# Patient Record
Sex: Male | Born: 1945 | Race: White | Hispanic: No | Marital: Married | Smoking: Never smoker
Health system: Southern US, Community
[De-identification: ages and names within clinical notes are randomized; demographics above are authoritative.]

## PROBLEM LIST (undated history)

## (undated) DIAGNOSIS — Z9109 Other allergy status, other than to drugs and biological substances: Secondary | ICD-10-CM

## (undated) DIAGNOSIS — H269 Unspecified cataract: Secondary | ICD-10-CM

## (undated) DIAGNOSIS — K219 Gastro-esophageal reflux disease without esophagitis: Secondary | ICD-10-CM

## (undated) DIAGNOSIS — M1990 Unspecified osteoarthritis, unspecified site: Secondary | ICD-10-CM

## (undated) DIAGNOSIS — J302 Other seasonal allergic rhinitis: Secondary | ICD-10-CM

## (undated) DIAGNOSIS — E785 Hyperlipidemia, unspecified: Secondary | ICD-10-CM

## (undated) DIAGNOSIS — T7840XA Allergy, unspecified, initial encounter: Secondary | ICD-10-CM

## (undated) DIAGNOSIS — N400 Enlarged prostate without lower urinary tract symptoms: Secondary | ICD-10-CM

## (undated) DIAGNOSIS — I2510 Atherosclerotic heart disease of native coronary artery without angina pectoris: Secondary | ICD-10-CM

## (undated) DIAGNOSIS — D369 Benign neoplasm, unspecified site: Secondary | ICD-10-CM

## (undated) DIAGNOSIS — R030 Elevated blood-pressure reading, without diagnosis of hypertension: Secondary | ICD-10-CM

## (undated) HISTORY — DX: Benign neoplasm, unspecified site: D36.9

## (undated) HISTORY — DX: Allergy, unspecified, initial encounter: T78.40XA

## (undated) HISTORY — PX: POLYPECTOMY: SHX149

## (undated) HISTORY — DX: Benign prostatic hyperplasia without lower urinary tract symptoms: N40.0

## (undated) HISTORY — DX: Gastro-esophageal reflux disease without esophagitis: K21.9

## (undated) HISTORY — DX: Elevated blood-pressure reading, without diagnosis of hypertension: R03.0

## (undated) HISTORY — DX: Unspecified cataract: H26.9

## (undated) HISTORY — DX: Other seasonal allergic rhinitis: J30.2

## (undated) HISTORY — DX: Other allergy status, other than to drugs and biological substances: Z91.09

## (undated) HISTORY — DX: Unspecified osteoarthritis, unspecified site: M19.90

## (undated) HISTORY — PX: OTHER SURGICAL HISTORY: SHX169

## (undated) HISTORY — DX: Atherosclerotic heart disease of native coronary artery without angina pectoris: I25.10

## (undated) HISTORY — DX: Hyperlipidemia, unspecified: E78.5

## (undated) HISTORY — PX: COLONOSCOPY: SHX174

---
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
## 2002-06-21 HISTORY — PX: CORONARY ANGIOPLASTY WITH STENT PLACEMENT: SHX49

## 2002-06-21 HISTORY — PX: ANGIOPLASTY: SHX39

## 2002-11-19 ENCOUNTER — Inpatient Hospital Stay (HOSPITAL_COMMUNITY): Admission: EM | Admit: 2002-11-19 | Discharge: 2002-11-21 | Payer: Self-pay | Admitting: Emergency Medicine

## 2002-11-19 ENCOUNTER — Encounter: Payer: Self-pay | Admitting: Cardiology

## 2004-04-23 ENCOUNTER — Ambulatory Visit: Payer: Self-pay | Admitting: Gastroenterology

## 2004-05-26 ENCOUNTER — Encounter: Admission: RE | Admit: 2004-05-26 | Discharge: 2004-05-26 | Payer: Self-pay | Admitting: *Deleted

## 2004-09-22 ENCOUNTER — Ambulatory Visit: Payer: Self-pay | Admitting: Critical Care Medicine

## 2004-09-28 ENCOUNTER — Ambulatory Visit: Payer: Self-pay | Admitting: Cardiology

## 2004-10-06 ENCOUNTER — Ambulatory Visit: Payer: Self-pay | Admitting: Cardiology

## 2004-10-08 ENCOUNTER — Ambulatory Visit: Payer: Self-pay | Admitting: Cardiology

## 2004-10-13 ENCOUNTER — Ambulatory Visit: Payer: Self-pay | Admitting: Critical Care Medicine

## 2005-03-19 ENCOUNTER — Ambulatory Visit: Payer: Self-pay | Admitting: Internal Medicine

## 2005-03-23 ENCOUNTER — Ambulatory Visit: Payer: Self-pay | Admitting: Internal Medicine

## 2005-03-31 ENCOUNTER — Ambulatory Visit: Payer: Self-pay

## 2005-04-06 ENCOUNTER — Ambulatory Visit: Payer: Self-pay | Admitting: Internal Medicine

## 2005-06-01 ENCOUNTER — Ambulatory Visit: Payer: Self-pay | Admitting: Internal Medicine

## 2005-06-17 ENCOUNTER — Ambulatory Visit: Payer: Self-pay | Admitting: Pulmonary Disease

## 2005-07-01 ENCOUNTER — Ambulatory Visit: Payer: Self-pay | Admitting: Critical Care Medicine

## 2005-07-14 ENCOUNTER — Ambulatory Visit: Payer: Self-pay | Admitting: Family Medicine

## 2005-08-04 ENCOUNTER — Ambulatory Visit: Payer: Self-pay | Admitting: Critical Care Medicine

## 2005-09-06 ENCOUNTER — Ambulatory Visit: Payer: Self-pay | Admitting: Critical Care Medicine

## 2005-10-19 ENCOUNTER — Ambulatory Visit: Payer: Self-pay | Admitting: Family Medicine

## 2005-12-07 ENCOUNTER — Ambulatory Visit: Payer: Self-pay | Admitting: Family Medicine

## 2006-04-28 ENCOUNTER — Ambulatory Visit: Payer: Self-pay | Admitting: Internal Medicine

## 2006-05-24 ENCOUNTER — Ambulatory Visit: Payer: Self-pay | Admitting: Internal Medicine

## 2006-05-31 ENCOUNTER — Ambulatory Visit: Payer: Self-pay | Admitting: Critical Care Medicine

## 2006-06-09 ENCOUNTER — Ambulatory Visit: Payer: Self-pay | Admitting: Internal Medicine

## 2006-06-20 ENCOUNTER — Ambulatory Visit: Payer: Self-pay | Admitting: Internal Medicine

## 2006-07-19 ENCOUNTER — Ambulatory Visit: Payer: Self-pay | Admitting: Internal Medicine

## 2007-02-06 LAB — CONVERTED CEMR LAB
Albumin: 3.9 g/dL (ref 3.5–5.2)
Alkaline Phosphatase: 59 units/L (ref 39–117)
BUN: 17 mg/dL (ref 6–23)
Basophils Absolute: 0 10*3/uL (ref 0.0–0.1)
Bilirubin Urine: NEGATIVE
Calcium: 9 mg/dL (ref 8.4–10.5)
GFR calc non Af Amer: 73 mL/min
HCT: 44.1 % (ref 39.0–52.0)
Hemoglobin: 14.9 g/dL (ref 13.0–17.0)
Hgb A1c MFr Bld: 5.4 % (ref 4.6–6.0)
Ketones, ur: NEGATIVE mg/dL
Leukocytes, UA: NEGATIVE
MCHC: 33.9 g/dL (ref 30.0–36.0)
MCV: 88 fL (ref 78.0–100.0)
Monocytes Relative: 8.4 % (ref 3.0–11.0)
Neutro Abs: 4.6 10*3/uL (ref 1.4–7.7)
PSA: 1.8 ng/mL (ref 0.10–4.00)
Platelets: 233 10*3/uL (ref 150–400)
TSH: 1.64 microintl units/mL (ref 0.35–5.50)
Total Protein, Urine: NEGATIVE mg/dL
Total Protein: 6.9 g/dL (ref 6.0–8.3)
Triglyceride fasting, serum: 92 mg/dL (ref 0–149)
pH: 6 (ref 5.0–8.0)

## 2007-03-08 ENCOUNTER — Ambulatory Visit: Payer: Self-pay | Admitting: Family Medicine

## 2007-04-25 ENCOUNTER — Ambulatory Visit: Payer: Self-pay | Admitting: Internal Medicine

## 2007-05-05 DIAGNOSIS — J309 Allergic rhinitis, unspecified: Secondary | ICD-10-CM | POA: Insufficient documentation

## 2007-05-15 ENCOUNTER — Telehealth (INDEPENDENT_AMBULATORY_CARE_PROVIDER_SITE_OTHER): Payer: Self-pay | Admitting: *Deleted

## 2007-06-13 ENCOUNTER — Telehealth (INDEPENDENT_AMBULATORY_CARE_PROVIDER_SITE_OTHER): Payer: Self-pay | Admitting: *Deleted

## 2007-06-19 ENCOUNTER — Ambulatory Visit: Payer: Self-pay | Admitting: Internal Medicine

## 2007-06-19 DIAGNOSIS — T7841XA Arthus phenomenon, initial encounter: Secondary | ICD-10-CM | POA: Insufficient documentation

## 2007-06-19 LAB — CONVERTED CEMR LAB
Alkaline Phosphatase: 50 units/L (ref 39–117)
Basophils Relative: 0.9 % (ref 0.0–1.0)
Bilirubin, Direct: 0.2 mg/dL (ref 0.0–0.3)
CO2: 28 meq/L (ref 19–32)
Calcium: 9.1 mg/dL (ref 8.4–10.5)
Chloride: 103 meq/L (ref 96–112)
Cholesterol: 114 mg/dL (ref 0–200)
Eosinophils Absolute: 0.1 10*3/uL (ref 0.0–0.6)
GFR calc non Af Amer: 73 mL/min
Glucose, Bld: 114 mg/dL — ABNORMAL HIGH (ref 70–99)
Hemoglobin: 14.6 g/dL (ref 13.0–17.0)
Lymphocytes Relative: 32.2 % (ref 12.0–46.0)
MCHC: 35.7 g/dL (ref 30.0–36.0)
MCV: 87.1 fL (ref 78.0–100.0)
Monocytes Absolute: 0.4 10*3/uL (ref 0.2–0.7)
Monocytes Relative: 8.3 % (ref 3.0–11.0)
Neutrophils Relative %: 56.4 % (ref 43.0–77.0)
Total Protein: 7.1 g/dL (ref 6.0–8.3)

## 2007-06-20 ENCOUNTER — Ambulatory Visit: Payer: Self-pay | Admitting: Internal Medicine

## 2007-06-20 DIAGNOSIS — I951 Orthostatic hypotension: Secondary | ICD-10-CM | POA: Insufficient documentation

## 2007-06-20 DIAGNOSIS — K209 Esophagitis, unspecified without bleeding: Secondary | ICD-10-CM | POA: Insufficient documentation

## 2007-06-20 DIAGNOSIS — E782 Mixed hyperlipidemia: Secondary | ICD-10-CM | POA: Insufficient documentation

## 2007-06-20 LAB — CONVERTED CEMR LAB: Cholesterol, target level: 200 mg/dL

## 2007-09-27 ENCOUNTER — Ambulatory Visit: Payer: Self-pay | Admitting: Internal Medicine

## 2007-09-27 DIAGNOSIS — M25519 Pain in unspecified shoulder: Secondary | ICD-10-CM | POA: Insufficient documentation

## 2007-09-27 DIAGNOSIS — E785 Hyperlipidemia, unspecified: Secondary | ICD-10-CM | POA: Insufficient documentation

## 2007-09-27 LAB — CONVERTED CEMR LAB: LDL Goal: 100 mg/dL

## 2007-09-28 ENCOUNTER — Encounter (INDEPENDENT_AMBULATORY_CARE_PROVIDER_SITE_OTHER): Payer: Self-pay | Admitting: *Deleted

## 2007-10-02 ENCOUNTER — Ambulatory Visit: Payer: Self-pay | Admitting: Internal Medicine

## 2007-10-08 LAB — CONVERTED CEMR LAB
AST: 22 units/L (ref 0–37)
Bilirubin, Direct: 0.2 mg/dL (ref 0.0–0.3)
Hgb A1c MFr Bld: 5.6 % (ref 4.6–6.0)
LDL Cholesterol: 63 mg/dL (ref 0–99)
PSA: 3.3 ng/mL (ref 0.10–4.00)
Total CHOL/HDL Ratio: 4.4
Triglycerides: 139 mg/dL (ref 0–149)

## 2007-10-09 ENCOUNTER — Encounter (INDEPENDENT_AMBULATORY_CARE_PROVIDER_SITE_OTHER): Payer: Self-pay | Admitting: *Deleted

## 2007-10-16 ENCOUNTER — Encounter: Payer: Self-pay | Admitting: Internal Medicine

## 2007-11-22 ENCOUNTER — Ambulatory Visit: Payer: Self-pay | Admitting: Cardiology

## 2007-12-21 ENCOUNTER — Telehealth (INDEPENDENT_AMBULATORY_CARE_PROVIDER_SITE_OTHER): Payer: Self-pay | Admitting: *Deleted

## 2008-01-10 ENCOUNTER — Telehealth (INDEPENDENT_AMBULATORY_CARE_PROVIDER_SITE_OTHER): Payer: Self-pay | Admitting: *Deleted

## 2008-06-04 ENCOUNTER — Telehealth (INDEPENDENT_AMBULATORY_CARE_PROVIDER_SITE_OTHER): Payer: Self-pay | Admitting: *Deleted

## 2008-06-05 ENCOUNTER — Ambulatory Visit: Payer: Self-pay | Admitting: Internal Medicine

## 2008-06-05 DIAGNOSIS — R030 Elevated blood-pressure reading, without diagnosis of hypertension: Secondary | ICD-10-CM | POA: Insufficient documentation

## 2008-06-05 DIAGNOSIS — R972 Elevated prostate specific antigen [PSA]: Secondary | ICD-10-CM | POA: Insufficient documentation

## 2008-06-10 ENCOUNTER — Telehealth (INDEPENDENT_AMBULATORY_CARE_PROVIDER_SITE_OTHER): Payer: Self-pay | Admitting: *Deleted

## 2008-07-17 ENCOUNTER — Telehealth (INDEPENDENT_AMBULATORY_CARE_PROVIDER_SITE_OTHER): Payer: Self-pay | Admitting: *Deleted

## 2008-08-30 ENCOUNTER — Telehealth (INDEPENDENT_AMBULATORY_CARE_PROVIDER_SITE_OTHER): Payer: Self-pay | Admitting: *Deleted

## 2008-09-06 ENCOUNTER — Ambulatory Visit: Payer: Self-pay | Admitting: Internal Medicine

## 2008-09-15 LAB — CONVERTED CEMR LAB
ALT: 23 units/L (ref 0–53)
AST: 21 units/L (ref 0–37)
Albumin: 4.1 g/dL (ref 3.5–5.2)
Bilirubin, Direct: 0.2 mg/dL (ref 0.0–0.3)
Calcium: 9 mg/dL (ref 8.4–10.5)
Chloride: 106 meq/L (ref 96–112)
Cholesterol: 124 mg/dL (ref 0–200)
GFR calc non Af Amer: 91.25 mL/min (ref >60)
Glucose, Bld: 111 mg/dL — ABNORMAL HIGH (ref 70–99)
Hemoglobin: 14.8 g/dL (ref 13.0–17.0)
LDL Cholesterol: 66 mg/dL (ref 0–99)
Lymphocytes Relative: 28 % (ref 12.0–46.0)
MCHC: 35.4 g/dL (ref 30.0–36.0)
Monocytes Relative: 8 % (ref 3.0–12.0)
Neutro Abs: 3 10*3/uL (ref 1.4–7.7)
Neutrophils Relative %: 61.2 % (ref 43.0–77.0)
Platelets: 198 10*3/uL (ref 150.0–400.0)
Potassium: 4.1 meq/L (ref 3.5–5.1)
RBC: 4.74 M/uL (ref 4.22–5.81)
RDW: 12.4 % (ref 11.5–14.6)
Sodium: 140 meq/L (ref 135–145)
Total Bilirubin: 1 mg/dL (ref 0.3–1.2)
Total Protein: 7 g/dL (ref 6.0–8.3)

## 2008-09-17 ENCOUNTER — Encounter (INDEPENDENT_AMBULATORY_CARE_PROVIDER_SITE_OTHER): Payer: Self-pay | Admitting: *Deleted

## 2008-10-16 ENCOUNTER — Ambulatory Visit: Payer: Self-pay | Admitting: Internal Medicine

## 2008-10-16 DIAGNOSIS — N400 Enlarged prostate without lower urinary tract symptoms: Secondary | ICD-10-CM | POA: Insufficient documentation

## 2008-10-16 DIAGNOSIS — I2510 Atherosclerotic heart disease of native coronary artery without angina pectoris: Secondary | ICD-10-CM | POA: Insufficient documentation

## 2008-10-16 DIAGNOSIS — Z860100 Personal history of colon polyps, unspecified: Secondary | ICD-10-CM | POA: Insufficient documentation

## 2008-10-16 DIAGNOSIS — Z85828 Personal history of other malignant neoplasm of skin: Secondary | ICD-10-CM | POA: Insufficient documentation

## 2008-12-10 ENCOUNTER — Telehealth (INDEPENDENT_AMBULATORY_CARE_PROVIDER_SITE_OTHER): Payer: Self-pay | Admitting: *Deleted

## 2009-01-02 ENCOUNTER — Emergency Department (HOSPITAL_COMMUNITY): Admission: EM | Admit: 2009-01-02 | Discharge: 2009-01-02 | Payer: Self-pay | Admitting: Emergency Medicine

## 2009-03-05 ENCOUNTER — Ambulatory Visit: Payer: Self-pay | Admitting: Family Medicine

## 2009-03-10 ENCOUNTER — Telehealth (INDEPENDENT_AMBULATORY_CARE_PROVIDER_SITE_OTHER): Payer: Self-pay | Admitting: *Deleted

## 2009-03-18 ENCOUNTER — Encounter (INDEPENDENT_AMBULATORY_CARE_PROVIDER_SITE_OTHER): Payer: Self-pay | Admitting: *Deleted

## 2009-03-18 LAB — CONVERTED CEMR LAB
HDL: 28.7 mg/dL — ABNORMAL LOW (ref >39.00)
Hgb A1c MFr Bld: 5.5 % (ref 4.6–6.5)
Total CHOL/HDL Ratio: 4
VLDL: 22.8 mg/dL (ref 0.0–40.0)

## 2009-03-19 ENCOUNTER — Telehealth (INDEPENDENT_AMBULATORY_CARE_PROVIDER_SITE_OTHER): Payer: Self-pay | Admitting: *Deleted

## 2009-04-03 ENCOUNTER — Ambulatory Visit: Payer: Self-pay | Admitting: Internal Medicine

## 2009-08-20 ENCOUNTER — Ambulatory Visit: Payer: Self-pay | Admitting: Internal Medicine

## 2009-08-20 DIAGNOSIS — R0609 Other forms of dyspnea: Secondary | ICD-10-CM

## 2009-08-20 DIAGNOSIS — R0989 Other specified symptoms and signs involving the circulatory and respiratory systems: Secondary | ICD-10-CM | POA: Insufficient documentation

## 2009-10-06 ENCOUNTER — Telehealth (INDEPENDENT_AMBULATORY_CARE_PROVIDER_SITE_OTHER): Payer: Self-pay | Admitting: *Deleted

## 2009-12-31 ENCOUNTER — Encounter: Payer: Self-pay | Admitting: Cardiology

## 2010-01-01 ENCOUNTER — Ambulatory Visit: Payer: Self-pay | Admitting: Cardiology

## 2010-01-27 ENCOUNTER — Ambulatory Visit: Payer: Self-pay

## 2010-01-27 ENCOUNTER — Ambulatory Visit (HOSPITAL_COMMUNITY): Admission: RE | Admit: 2010-01-27 | Discharge: 2010-01-27 | Payer: Self-pay | Admitting: Cardiology

## 2010-01-27 ENCOUNTER — Encounter: Payer: Self-pay | Admitting: Cardiology

## 2010-01-27 ENCOUNTER — Ambulatory Visit: Payer: Self-pay | Admitting: Cardiology

## 2010-03-11 ENCOUNTER — Ambulatory Visit: Payer: Self-pay | Admitting: Internal Medicine

## 2010-04-09 ENCOUNTER — Telehealth: Payer: Self-pay | Admitting: Internal Medicine

## 2010-04-10 ENCOUNTER — Telehealth: Payer: Self-pay | Admitting: Internal Medicine

## 2010-05-13 ENCOUNTER — Ambulatory Visit: Payer: Self-pay | Admitting: Family

## 2010-05-13 ENCOUNTER — Ambulatory Visit: Payer: Self-pay | Admitting: Interventional Radiology

## 2010-05-13 ENCOUNTER — Ambulatory Visit (HOSPITAL_BASED_OUTPATIENT_CLINIC_OR_DEPARTMENT_OTHER): Admission: RE | Admit: 2010-05-13 | Discharge: 2010-05-13 | Payer: Self-pay | Admitting: Internal Medicine

## 2010-05-13 ENCOUNTER — Telehealth: Payer: Self-pay | Admitting: Family

## 2010-06-05 ENCOUNTER — Ambulatory Visit: Payer: Self-pay | Admitting: Internal Medicine

## 2010-06-21 HISTORY — PX: CATARACT EXTRACTION, BILATERAL: SHX1313

## 2010-06-23 ENCOUNTER — Telehealth (INDEPENDENT_AMBULATORY_CARE_PROVIDER_SITE_OTHER): Payer: Self-pay | Admitting: *Deleted

## 2010-06-29 ENCOUNTER — Telehealth: Payer: Self-pay | Admitting: Internal Medicine

## 2010-07-01 ENCOUNTER — Other Ambulatory Visit: Payer: Self-pay | Admitting: Internal Medicine

## 2010-07-01 ENCOUNTER — Ambulatory Visit
Admission: RE | Admit: 2010-07-01 | Discharge: 2010-07-01 | Payer: Self-pay | Source: Home / Self Care | Attending: Internal Medicine | Admitting: Internal Medicine

## 2010-07-01 LAB — CBC WITH DIFFERENTIAL/PLATELET
Basophils Absolute: 0 10*3/uL (ref 0.0–0.1)
Basophils Relative: 0.6 % (ref 0.0–3.0)
Eosinophils Absolute: 0.1 10*3/uL (ref 0.0–0.7)
Eosinophils Relative: 2.7 % (ref 0.0–5.0)
HCT: 42.5 % (ref 39.0–52.0)
Hemoglobin: 14.8 g/dL (ref 13.0–17.0)
Lymphocytes Relative: 34 % (ref 12.0–46.0)
Lymphs Abs: 1.7 10*3/uL (ref 0.7–4.0)
MCHC: 34.8 g/dL (ref 30.0–36.0)
MCV: 88.8 fl (ref 78.0–100.0)
Monocytes Absolute: 0.3 10*3/uL (ref 0.1–1.0)
Monocytes Relative: 6.9 % (ref 3.0–12.0)
Neutro Abs: 2.8 10*3/uL (ref 1.4–7.7)
Neutrophils Relative %: 55.8 % (ref 43.0–77.0)
Platelets: 212 10*3/uL (ref 150.0–400.0)
RBC: 4.79 Mil/uL (ref 4.22–5.81)
RDW: 12.9 % (ref 11.5–14.6)
WBC: 5 10*3/uL (ref 4.5–10.5)

## 2010-07-01 LAB — BASIC METABOLIC PANEL
BUN: 21 mg/dL (ref 6–23)
CO2: 29 mEq/L (ref 19–32)
Calcium: 9.4 mg/dL (ref 8.4–10.5)
Chloride: 105 mEq/L (ref 96–112)
Creatinine, Ser: 0.9 mg/dL (ref 0.4–1.5)
GFR: 89.56 mL/min (ref >60.00)
Glucose, Bld: 109 mg/dL — ABNORMAL HIGH (ref 70–99)
Potassium: 4.8 mEq/L (ref 3.5–5.1)
Sodium: 141 mEq/L (ref 135–145)

## 2010-07-01 LAB — HEPATIC FUNCTION PANEL
ALT: 37 U/L (ref 0–53)
AST: 28 U/L (ref 0–37)
Albumin: 4.2 g/dL (ref 3.5–5.2)
Alkaline Phosphatase: 46 U/L (ref 39–117)
Bilirubin, Direct: 0.1 mg/dL (ref 0.0–0.3)
Total Bilirubin: 0.8 mg/dL (ref 0.3–1.2)
Total Protein: 7.2 g/dL (ref 6.0–8.3)

## 2010-07-01 LAB — PSA: PSA: 1.24 ng/mL (ref 0.10–4.00)

## 2010-07-01 LAB — TSH: TSH: 1.3 u[IU]/mL (ref 0.35–5.50)

## 2010-07-12 ENCOUNTER — Encounter: Payer: Self-pay | Admitting: Critical Care Medicine

## 2010-07-14 ENCOUNTER — Encounter: Payer: Self-pay | Admitting: Internal Medicine

## 2010-07-16 ENCOUNTER — Ambulatory Visit
Admission: RE | Admit: 2010-07-16 | Discharge: 2010-07-16 | Payer: Self-pay | Source: Home / Self Care | Attending: Family Medicine | Admitting: Family Medicine

## 2010-07-16 DIAGNOSIS — R7309 Other abnormal glucose: Secondary | ICD-10-CM | POA: Insufficient documentation

## 2010-07-19 LAB — CONVERTED CEMR LAB
Basophils Absolute: 0 10*3/uL (ref 0.0–0.1)
Eosinophils Relative: 2.2 % (ref 0.0–5.0)
Hemoglobin: 14.1 g/dL (ref 13.0–17.0)
Lymphocytes Relative: 24.7 % (ref 12.0–46.0)
Monocytes Relative: 7.5 % (ref 3.0–12.0)
Platelets: 194 10*3/uL (ref 150.0–400.0)
RDW: 12.8 % (ref 11.5–14.6)
WBC: 5 10*3/uL (ref 4.5–10.5)

## 2010-07-23 NOTE — Progress Notes (Signed)
Summary: Call request/cough  Phone Note Call from Patient Call back at Home Phone [phone]   Summary of Call: Patient returned call to MD stating that he has previously been dx with GERD and would like to discuss this cough with Dr. Alwyn Ren before the prescription for cough syrup is faxed to his pharmacy. Please call the patient at home. Initial call taken by: Lucious Groves CMA,  April 10, 2010 2:34 PM  Follow-up for Phone Call        if this is caused by ERD, Omeprazole 20 mg two times a day 30 min pre meals will resolve cough (#60), refill X1 Follow-up by: Marga Melnick MD,  April 10, 2010 3:02 PM  Additional Follow-up for Phone Call Additional follow up Details #1::        Patient notified of the above and Omeprazole prescription sent. Cough med was never sent so I removed from med list. Additional Follow-up by: Lucious Groves CMA,  April 10, 2010 3:14 PM    New/Updated Medications: OMEPRAZOLE 20 MG CPDR (OMEPRAZOLE) 1 by mouth two times a day Prescriptions: OMEPRAZOLE 20 MG CPDR (OMEPRAZOLE) 1 by mouth two times a day  #60 x 3   Entered by:   Lucious Groves CMA   Authorized by:   Marga Melnick MD   Signed by:   Lucious Groves CMA on 04/10/2010   Method used:   Faxed to ...       [facility]* (retail)       [address]       [place], [place]  [id number]       Ph: [phone]       Fax: [phone]   RxID:   [id number]

## 2010-07-23 NOTE — Assessment & Plan Note (Signed)
Summary: cpx//lch   Vital Signs:  Patient profile:   65 year old male Height:      74 inches (187.96 cm) Weight:      223.38 pounds (101.54 kg) BMI:     28.78 Temp:     98.0 degrees F (36.67 degrees C) oral Resp:     12 per minute BP sitting:   140 / 80  (left arm) Cuff size:   regular  Vitals Entered By: Lucious Groves CMA (July 16, 2010 1:03 PM) CC: CPX./kb Is Patient Diabetic? No Pain Assessment Patient in pain? no      Comments Patient notes that he is taking Nexium and would like it changed to Protonix.  Recheck on B/P 138/84 (Left Arm)    Primary Care Provider:  Marga Melnick MD  CC:  CPX./kb.  History of Present Illness:    [name] is here for a physical; he is asymptomatic.    Hyperlipidemia Follow-Up: The patient denies muscle aches, GI upset, abdominal pain, flushing, itching, constipation, diarrhea, and fatigue.  Other symptoms include dypsnea.  The patient denies the following symptoms: chest pain/pressure, exercise intolerance, palpitations, syncope, and pedal edema.  Compliance with medications (by patient report) has been near 100%.  Dietary compliance has been good.  The patient reports no exercise.  Adjunctive measures currently used by the patient include fiber, ASA, fish oil supplements, and Co-QA.   he is only 1 Lovasa two times a day . [facility] Heart Panel reviewed & risks / options discussed.  Current Medications (verified): 1)  Nasacort Aq 55 Mcg/act  Aers (Triamcinolone Acetonide(Nasal)) .... Use 2 Sprays Each Nostril Once A Day For Runny Nose and Drainage 2)  Adult Aspirin Low Strength 81 Mg  Tbdp (Aspirin) .Marland Kitchen.. 1 By Mouth Two Times A Day 3)  Uroxatral 10 Mg  Tb24 (Alfuzosin Hcl) .... 1/2 Tablet Daily As Needed. 4)  Crestor 20 Mg  Tabs (Rosuvastatin Calcium) .Marland Kitchen.. 1 By Mouth Once Daily **appointment Due** 5)  Avodart 0.5 Mg  Caps (Dutasteride) .Marland Kitchen.. 1 By Mouth 6)  Lovaza 1 Gm Caps (Omega-3-Acid Ethyl Esters) .... 2 Pills Two Times A Day **appointment  Due** 7)  Glucosamine Msm Complex  Tabs (Glucos-Msm-C-Mn-Ginger-Willow) .... 2 By Mouth Daily 8)  Multivitamins   Tabs (Multiple Vitamin) .... Take 1 Tablet By Mouth Two Times A Day. 9)  Xyzal 5 Mg Tabs (Levocetirizine Dihydrochloride) .Marland Kitchen.. 1 At Bedtime As Needed 10)  Nexium 40 Mg Cpdr (Esomeprazole Magnesium) .Marland Kitchen.. 1 By Mouth Once Daily  Allergies (verified): 1)  ! Sulfa  Past History:  Past Medical History: Allergic Rhinitis; ? asthma variant Hyperlipidemia Benign prostatic hypertrophy, Dr Londell Moh  [place] Colonic polyps, PMH  of Coronary artery disease, Dr Jerral Bonito.Marland KitchenMarland KitchenTaxus stent 2004 /  nuclear.. 2006.. no ischemia...EF 63% EF 63%.... nuclear..... 2006 Skin cancer, squamous Cell , PMH  of  DOE  climbing stairs  Past Surgical History: Cath/ stent 2003: 90% block of circumflex Colon polypectomy 2000, negative  2005 Tonsillectomy Cataract extraction OS 05/2010, OD scheduled 07/2010, Dr Burgess Estelle  Family History: Father: DM,MI in 41s Mother: Osteoporosis Siblings: bro: MI in 50s,lung  cancer, leukemia; P uncle: MI in 3s; MGM: CVA  Social History: Retired Married Never Smoked Alcohol use-yes: socially Regular exercise-no  Review of Systems  The patient denies anorexia, fever, vision loss, decreased hearing, hoarseness, prolonged cough, hemoptysis, melena, hematochezia, severe indigestion/heartburn, hematuria, suspicious skin lesions, depression, unusual weight change, abnormal bleeding, enlarged lymph nodes, and angioedema.  Weight creeping up  Physical Exam  General:  well-nourished;alert,appropriate and cooperative throughout examination Head:  Normocephalic and atraumatic without obvious abnormalities. No  alopecia Eyes:  No corneal or conjunctival inflammation noted. Cataract OD Ears:  External ear exam shows no significant lesions or deformities.  Otoscopic examination reveals clear canals, tympanic membranes are intact bilaterally without bulging,  retraction, inflammation or discharge. Hearing is grossly normal bilaterally. Nose:  External nasal examination shows no deformity or inflammation. Nasal mucosa are pink and moist without lesions or exudates. Mouth:  Oral mucosa and oropharynx without lesions or exudates.  Teeth in good repair. Neck:  No deformities, masses, or tenderness noted. Lungs:  Normal respiratory effort, chest expands symmetrically. Lungs are clear to auscultation, no crackles or wheezes. Heart:  normal rate, regular rhythm, no gallop, no rub, no JVD, no HJR, and grade 1 /6 systolic murmur.   Abdomen:  Bowel sounds positive,abdomen soft and non-tender without masses, organomegaly or hernias noted. Genitalia:  Dr Aldean Ast Msk:  No deformity or scoliosis noted of thoracic or lumbar spine.   Pulses:  R and L carotid,radial,dorsalis pedis and posterior tibial pulses are full and equal bilaterally Extremities:  No clubbing, cyanosis, edema, or deformity noted with normal full range of motion of all joints.   Neurologic:  alert & oriented X3 and DTRs symmetrical and normal.   Skin:  Intact without suspicious lesions or rashes Cervical Nodes:  No lymphadenopathy noted Axillary Nodes:  No palpable lymphadenopathy Psych:  memory intact for recent and remote, normally interactive, and good eye contact.     Impression & Recommendations:  Problem # 1:  ROUTINE GENERAL MEDICAL EXAM@HEALTH  CARE FACL (ICD-V70.0)  Problem # 2:  CORONARY ARTERY DISEASE (ICD-414.00)  His updated medication list for this problem includes:    Adult Aspirin Low Strength 81 Mg Tbdp (Aspirin) .Marland Kitchen... 1 by mouth two times a day    Ramipril 5 Mg Caps (Ramipril) .Marland Kitchen... 1 once daily  Problem # 3:  HYPERLIPIDEMIA (ICD-272.4)  His updated medication list for this problem includes:    Crestor 20 Mg Tabs (Rosuvastatin calcium) .Marland Kitchen... 1 by mouth once daily    Lovaza 1 Gm Caps (Omega-3-acid ethyl esters) .Marland Kitchen... 2 pills two times a day  Problem # 4:   HYPERGLYCEMIA, FASTING (ICD-790.29)  pre Diabetes is suggested by high insulin level & FBS > 100  His updated medication list for this problem includes:    Metformin Hcl 500 Mg Xr24h-tab (Metformin hcl) .Marland Kitchen... 1 once daily with largest meal  Problem # 5:  DYSPNEA/SHORTNESS OF BREATH (ICD-786.09)  His updated medication list for this problem includes:    Ramipril 5 Mg Caps (Ramipril) .Marland Kitchen... 1 once daily  Problem # 6:  ELEVATED BLOOD PRESSURE WITHOUT DIAGNOSIS OF HYPERTENSION (ICD-796.2)  His updated medication list for this problem includes:    Ramipril 5 Mg Caps (Ramipril) .Marland Kitchen... 1 once daily  Complete Medication List: 1)  Nasacort Aq 55 Mcg/act Aers (Triamcinolone acetonide(nasal)) .... Use 2 sprays each nostril once a day for runny nose and drainage 2)  Adult Aspirin Low Strength 81 Mg Tbdp (Aspirin) .Marland Kitchen.. 1 by mouth two times a day 3)  Uroxatral 10 Mg Tb24 (Alfuzosin hcl) .... 1/2 tablet daily as needed. 4)  Crestor 20 Mg Tabs (Rosuvastatin calcium) .Marland Kitchen.. 1 by mouth once daily 5)  Avodart 0.5 Mg Caps (Dutasteride) .Marland Kitchen.. 1 by mouth 6)  Lovaza 1 Gm Caps (Omega-3-acid ethyl esters) .... 2 pills two times a day 7)  Glucosamine Msm Complex Tabs (  Glucos-msm-c-mn-ginger-willow) .... 2 by mouth daily 8)  Multivitamins Tabs (Multiple vitamin) .... Take 1 tablet by mouth two times a day. 9)  Xyzal 5 Mg Tabs (Levocetirizine dihydrochloride) .Marland Kitchen.. 1 at bedtime as needed 10)  Pantoprazole Sodium 40 Mg (pantoprazole Sodium)  .Marland Kitchen.. 1 once daily 11)  Metformin Hcl 500 Mg Xr24h-tab (Metformin hcl) .Marland Kitchen.. 1 once daily with largest meal 12)  Ramipril 5 Mg Caps (Ramipril) .Marland Kitchen.. 1 once daily  Patient Instructions: 1)  Consider Lovaza 2 two times a day ; Metformin 500 mg with largest meal; low carb diet ; & Ramipril 5 mg  to address risks noted in [facility] Heart Panel. 2)  It is important that you exercise regularly at least 20 minutes 5 times a week. If you develop chest pain, have severe difficulty breathing, or  feel very tired , stop exercising immediately and seek medical attention. Try albuterol 1-2 puffs 15 min pre exercise. 3)  Check your Blood Pressure regularly. If it is above: 135/85 ON AVERAGE  you should make an appointment. Prescriptions: RAMIPRIL 5 MG CAPS (RAMIPRIL) 1 once daily  #90 x 1   Entered and Authorized by:   Marga Melnick MD   Signed by:   Marga Melnick MD on 07/16/2010   Method used:   Print then Give to Patient   RxID:   [id number] METFORMIN HCL 500 MG XR24H-TAB (METFORMIN HCL) 1 once daily with largest meal  #90 x 1   Entered and Authorized by:   Marga Melnick MD   Signed by:   Marga Melnick MD on 07/16/2010   Method used:   Printed then faxed to ...       [facility] (mail-order)             , [place]         Ph: [phone]       Fax: [phone]   RxID:   [id number] LOVAZA 1 GM CAPS (OMEGA-3-ACID ETHYL ESTERS) 2 pills two times a day  #360 x 3   Entered and Authorized by:   Marga Melnick MD   Signed by:   Marga Melnick MD on 07/16/2010   Method used:   Printed then faxed to ...       [facility] (mail-order)             , [place]         Ph: [phone]       Fax: [phone]   RxID:   [id number] PANTOPRAZOLE SODIUM 40 MG  (PANTOPRAZOLE SODIUM) 1 once daily  #90 x 3   Entered and Authorized by:   Marga Melnick MD   Signed by:   Marga Melnick MD on 07/16/2010   Method used:   Printed then faxed to ...       [facility] (mail-order)             , [place]         Ph: [phone]       Fax: [phone]   RxID:   [id number] CRESTOR 20 MG  TABS (ROSUVASTATIN CALCIUM) 1 by mouth once daily  #90 x 3   Entered and Authorized by:   Marga Melnick MD   Signed by:   Marga Melnick MD on 07/16/2010   Method used:   Printed then faxed to ...       [facility] (mail-order)             , [place]         Ph:  [phone]       Fax: [phone]   RxID:   [id number]    Orders Added: 1)  Est. Patient 40-64 years [[id number]]

## 2010-07-23 NOTE — Assessment & Plan Note (Signed)
Summary: COLD OR SINUS///SPH   Vital Signs:  Patient profile:   65 year old male Weight:      225.6 pounds Temp:     98.3 degrees F oral Pulse rate:   72 / minute Resp:     15 per minute BP sitting:   136 / 90  (left arm) Cuff size:   large  Vitals Entered By: Shonna Chock CMA (March 11, 2010 11:09 AM) CC: Cold Symtoms: cough and drainage x 2 days, URI symptoms   Primary Care Provider:  Marga Melnick MD  CC:  Cold Symtoms: cough and drainage x 2 days and URI symptoms.  History of Present Illness: URI Symptoms      This is a 65 year old man who presents with URI symptoms X 7 days, progreesive over 3 days.Ondet as dental pain on R.  The patient reports nasal congestion, sore throat, and productive cough with clear sputum, but denies purulent nasal discharge and earache.  The patient denies fever, dyspnea, and wheezing.  The patient denies headache.   The patient denies the following risk factors for Strep sinusitis: unilateral facial pain and tender adenopathy.  Rx: Sudafed, Saline gavage, Allegra, Nasocort  Allergies: 1)  ! Sulfa  Physical Exam  General:  well-nourished,in no acute distress; alert,appropriate and cooperative throughout examination Ears:  External ear exam shows no significant lesions or deformities.  Otoscopic examination reveals clear canals, tympanic membranes are intact bilaterally without bulging, retraction, inflammation or discharge. Hearing is grossly normal bilaterally. Nose:  External nasal examination shows no deformity or inflammation. Nasal mucosa are  dry & erythema Mouth:  Oral mucosa and oropharynx without lesions or exudates.  Teeth in good repair. Lungs:  Normal respiratory effort, chest expands symmetrically. Lungs are clear to auscultation, no crackles or wheezes but frothy sputum with cough Cervical Nodes:  No lymphadenopathy noted Axillary Nodes:  No palpable lymphadenopathy   Impression & Recommendations:  Problem # 1:  URI  (ICD-465.9)  The following medications were removed from the medication list:    Allegra 180 Mg Tabs (Fexofenadine hcl) .Marland Kitchen... 1 once daily as needed His updated medication list for this problem includes:    Adult Aspirin Low Strength 81 Mg Tbdp (Aspirin) .Marland Kitchen... 1 by mouth two times a day    Xyzal 5 Mg Tabs (Levocetirizine dihydrochloride) .Marland Kitchen... 1 at bedtime as needed  Complete Medication List: 1)  Nasacort Aq 55 Mcg/act Aers (Triamcinolone acetonide(nasal)) .... Use 2 sprays each nostril once a day for runny nose and drainage 2)  Adult Aspirin Low Strength 81 Mg Tbdp (Aspirin) .Marland Kitchen.. 1 by mouth two times a day 3)  Uroxatral 10 Mg Tb24 (Alfuzosin hcl) .Marland Kitchen.. 1 by mouth once daily 4)  Crestor 20 Mg Tabs (Rosuvastatin calcium) .Marland Kitchen.. 1 by mouth once daily **appointment due** 5)  Avodart 0.5 Mg Caps (Dutasteride) .Marland Kitchen.. 1 by mouth 6)  Lovaza 1 Gm Caps (Omega-3-acid ethyl esters) .... 2 pills two times a day 7)  Glucosamine Msm Complex Tabs (Glucos-msm-c-mn-ginger-willow) .... 2 by mouth daily 8)  Multivitamins Tabs (Multiple vitamin) .Marland Kitchen.. 1 by mouth daily 9)  Amoxicillin 500 Mg Caps (Amoxicillin) .Marland Kitchen.. 1 three times a day 10)  Prednisone (pak) 10 Mg Tabs (Prednisone) .... As per pack 11)  Xyzal 5 Mg Tabs (Levocetirizine dihydrochloride) .Marland Kitchen.. 1 at bedtime as needed  Patient Instructions: 1)  Drink as much NON dairy fluid as you can tolerate for the next few days. Prescriptions: XYZAL 5 MG TABS (LEVOCETIRIZINE DIHYDROCHLORIDE) 1 at bedtime as  needed  #30 x 5   Entered and Authorized by:   Marga Melnick MD   Signed by:   Marga Melnick MD on 03/11/2010   Method used:   Faxed to ...       [facility]* (retail)       [address]       [place], [place]  [id number]       Ph: [phone]       Fax: [phone]   RxID:   [id number] PREDNISONE (PAK) 10 MG TABS (PREDNISONE) as per pack  #6 day pack x 0   Entered and Authorized by:   Marga Melnick MD   Signed by:   Marga Melnick  MD on 03/11/2010   Method used:   Faxed to ...       [facility]* (retail)       [address]       [place], [place]  [address]       Ph: [phone]       Fax: [phone]   RxID:   [id number] AMOXICILLIN 500 MG CAPS (AMOXICILLIN) 1 three times a day  #30 x 0   Entered and Authorized by:   Marga Melnick MD   Signed by:   Marga Melnick MD on 03/11/2010   Method used:   Faxed to ...       [facility]* (retail)       [address]       [place], [place]  [address]       Ph: [phone]       Fax: [phone]   RxID:   [id number]

## 2010-07-23 NOTE — Assessment & Plan Note (Signed)
Summary: flu shot/cbs  Nurse Visit  CC: Flu shot./kb   Allergies: 1)  ! Sulfa  Immunizations Administered:  Influenza Vaccine # 1:    Vaccine Type: Fluvax Non-MCR    Site: left deltoid    Mfr: Sanofi Pasteur    Dose: 0.5 ml    Route: IM    Given by: Lucious Groves CMA    Exp. Date: 12/19/2010    Lot #: [id number]    VIS given: 01/13/10 version given June 05, 2010.  Orders Added: 1)  Influenza Vaccine NON MCR [00028]

## 2010-07-23 NOTE — Progress Notes (Signed)
Summary: cxr result  Phone Note Outgoing Call   Summary of Call: Please call patient on his cell [phone] and let him know that his chest x-ray is negative for pneumonia- looks good.  will treat him for a sinus infection with Ceftin x 10 days- rx has been sent to his pharmacy.  Initial call taken by: Lemont Fillers FNP,  May 13, 2010 1:13 PM  Follow-up for Phone Call        Pt notified. Nicki Guadalajara Fergerson CMA (AAMA)  May 13, 2010 1:23 PM     New/Updated Medications: CEFTIN 500 MG TABS (CEFUROXIME AXETIL) one tablet by mouth two times a day x 10 days Prescriptions: CEFTIN 500 MG TABS (CEFUROXIME AXETIL) one tablet by mouth two times a day x 10 days  #20 x 0   Entered and Authorized by:   Lemont Fillers FNP   Signed by:   Lemont Fillers FNP on 05/13/2010   Method used:   Electronically to        [facility]* (retail)       [address]       [place], [place]  [id number]       Ph: [phone]       Fax: [phone]   RxID:   [id number]

## 2010-07-23 NOTE — Progress Notes (Signed)
Summary: Lab Orders  Phone Note Call from Patient Call back at Home Phone [phone]   Caller: Patient Summary of Call: Patient would like to have labs done tomorrow morning in preparation for his CPX. He wanted to remind you about check his PSA and [facility]. Please include lab orders and diagnosis codes. Thanks! Initial call taken by: Harold Barban,  June 29, 2010 11:09 AM  Follow-up for Phone Call        Hopp please give order, ? if patient having [facility] does any other labs need to be order Follow-up by: Shonna Chock CMA,  June 30, 2010 1:29 PM  Additional Follow-up for Phone Call Additional follow up Details #1::        V70.0, 414.0,790.93, 211.3, 272.4; [facility] Heart Panel (1304X), BMET, hep panel, CBC & dif, TSH, PSA) Additional Follow-up by: Marga Melnick MD,  June 30, 2010 5:21 PM

## 2010-07-23 NOTE — Miscellaneous (Signed)
  Clinical Lists Changes  Observations: Added new observation of PAST MED HX: Allergic Rhinitis; ? asthma variant Hyperlipidemia Benign prostatic hypertrophy, Dr Aldean Ast Colonic polyps, hx of Coronary artery disease, Dr Jerral Bonito.Marland KitchenMarland KitchenTaxus stent 2004 /  stress test.. 2006.. no ischemia EF  normal Skin cancer, hx of, Dr Karlyn Agee, Derm Cataract, early (12/31/2009 17:17)       Past History:  Past Medical History: Allergic Rhinitis; ? asthma variant Hyperlipidemia Benign prostatic hypertrophy, Dr Aldean Ast Colonic polyps, hx of Coronary artery disease, Dr Jerral Bonito.Marland KitchenMarland KitchenTaxus stent 2004 /  stress test.. 2006.. no ischemia EF  normal Skin cancer, hx of, Dr Karlyn Agee, Derm Cataract, early

## 2010-07-23 NOTE — Progress Notes (Signed)
Summary: Refill Request  Phone Note Refill Request Message from:  Pharmacy on [facility] Fax #: [phone]  Refills Requested: Medication #1:  NASACORT AQ 55 MCG/ACT  AERS use 2 sprays each nostril once a day for runny nose and drainage   Dosage confirmed as above?Dosage Confirmed   Supply Requested: 16.50gm Next Appointment Scheduled: none Initial call taken by: Harold Barban,  October 06, 2009 9:29 AM    Prescriptions: NASACORT AQ 55 MCG/ACT  AERS (TRIAMCINOLONE ACETONIDE(NASAL)) use 2 sprays each nostril once a day for runny nose and drainage  #16.50 Each x 2   Entered by:   Shonna Chock   Authorized by:   Marga Melnick MD   Signed by:   Shonna Chock on 10/06/2009   Method used:   Electronically to        [facility]* (retail)       [address]       [place], [place]  [id number]       Ph: [phone]       Fax: [phone]   RxID:   [id number]

## 2010-07-23 NOTE — Assessment & Plan Note (Signed)
Summary: CHEST CONGESTION,SORE THROAT/RH.....--Rm 5   Vital Signs:  Patient profile:   65 year old male Height:      74 inches Weight:      225 pounds BMI:     28.99 O2 Sat:      94 % on Room air Temp:     98.0 degrees F oral Pulse rate:   94 / minute Pulse rhythm:   regular Resp:     16 per minute BP sitting:   130 / 80  (right arm) Cuff size:   large  Vitals Entered By: Mervin Kung CMA Duncan Dull) (May 13, 2010 10:58 AM)  O2 Flow:  Room air CC: Pt states he has had sinus drainage x 2 days. Now has sore throat and hoarsness. Has clear productive cough. Is Patient Diabetic? No Pain Assessment Patient in pain? no      Comments Pt states he is using OTC nasal wash without relief. Nicki Guadalajara Fergerson CMA Duncan Dull)  May 13, 2010 11:07 AM    Primary Care Provider:  Marga Melnick MD  CC:  Pt states he has had sinus drainage x 2 days. Now has sore throat and hoarsness. Has clear productive cough..  History of Present Illness: Pt is a 65 year old male with complaint of thick clear sinus drainage and sore thoat.   Onset of symptoms was 3 days ago.   Notes associated bad tasting nasal discharge and nagging cough.  Symptoms are not associated with fever. Symptoms are not improved with the use of  xyxal (1/2) tablet, nasacort, guaifenasin, or pseudofed.  Symptoms are worsened by nothing.      Allergies: 1)  ! Sulfa  Past History:  Past Medical History: Last updated: 01/01/2010 Allergic Rhinitis; ? asthma variant Hyperlipidemia Benign prostatic hypertrophy, Dr Aldean Ast Colonic polyps, hx of Coronary artery disease, Dr Jerral Bonito.Marland KitchenMarland KitchenTaxus stent 2004 /  nuclear.. 2006.. no ischemia...EF 63% EF 63%.... nuclear..... 2006 Skin cancer, hx of, Dr Karlyn Agee, Derm Cataract, early Shortness of breath..... climbing stairs.... July, 2011  Past Surgical History: Last updated: 10/16/2008 Cath/ stent 2003: 90% block of circumflex Colon polypectomy 2000, neg  2005 Tonsillectomy  Review of Systems       see HPI  Physical Exam  General:  Well-developed,well-nourished,in no acute distress; alert,appropriate and cooperative throughout examination Head:  Normocephalic and atraumatic without obvious abnormalities. No apparent alopecia or balding. Eyes:  PERRLA, sclera clear, EOM's grossly intact Ears:  bilateral TMs mild dullness without bulging or erythema Mouth:  Oral mucosa and oropharynx without lesions or exudates.  Teeth in good repair. Neck:  No deformities, masses, or tenderness noted. Lungs:  Normal respiratory effort, chest expands symmetrically. Lungs are clear to auscultation, no crackles or wheezes. Heart:  Normal rate and regular rhythm. S1 and S2 normal without gallop, murmur, click, rub or other extra sounds.   Impression & Recommendations:  Problem # 1:  SINUSITIS, ACUTE (ICD-461.9) Continue nasacort, will plan to treat with ceftin.     The following medications were removed from the medication list:    Amoxicillin 500 Mg Caps (Amoxicillin) .Marland Kitchen... 1 three times a day His updated medication list for this problem includes:    Nasacort Aq 55 Mcg/act Aers (Triamcinolone acetonide(nasal)) ..... Use 2 sprays each nostril once a day for runny nose and drainage    Ceftin 500 Mg Tabs (Cefuroxime axetil) ..... One tablet by mouth two times a day x 10 days  Problem # 2:  COUGH (ICD-786.2) O2 sat today in the office  is 93-94%,  lower than I would expect for a non-smoker.  Lung exam WNL.  CXR performed and is negative for pneumonia. Orders: CXR- 2view (CXR)  Complete Medication List: 1)  Nasacort Aq 55 Mcg/act Aers (Triamcinolone acetonide(nasal)) .... Use 2 sprays each nostril once a day for runny nose and drainage 2)  Adult Aspirin Low Strength 81 Mg Tbdp (Aspirin) .Marland Kitchen.. 1 by mouth two times a day 3)  Uroxatral 10 Mg Tb24 (Alfuzosin hcl) .... 1/2 tablet daily as needed. 4)  Crestor 20 Mg Tabs (Rosuvastatin calcium) .Marland Kitchen.. 1 by mouth  once daily **appointment due** 5)  Avodart 0.5 Mg Caps (Dutasteride) .Marland Kitchen.. 1 by mouth 6)  Lovaza 1 Gm Caps (Omega-3-acid ethyl esters) .... 2 pills two times a day **appointment due** 7)  Glucosamine Msm Complex Tabs (Glucos-msm-c-mn-ginger-willow) .... 2 by mouth daily 8)  Multivitamins Tabs (Multiple vitamin) .... Take 1 tablet by mouth two times a day. 9)  Xyzal 5 Mg Tabs (Levocetirizine dihydrochloride) .Marland Kitchen.. 1 at bedtime as needed 10)  Ceftin 500 Mg Tabs (Cefuroxime axetil) .... One tablet by mouth two times a day x 10 days  Patient Instructions: 1)  Please complete your chest x-ray downstairs today.  2)  We will call you with your results.   Orders Added: 1)  CXR- 2view [CXR] 2)  Est. Patient Level IV [78469]    Current Allergies (reviewed today): ! SULFA

## 2010-07-23 NOTE — Progress Notes (Signed)
Summary: cough  Phone Note Call from Patient Call back at [facility] Phone [phone]   Summary of Call: Patient states that he still has a dry non-productive cough. Is there something else he can try or does he need to come back in/be referred to a specialist? He states that antihistamines and cough meds are not working.  Initial call taken by: Lucious Groves CMA,  April 09, 2010 11:53 AM  Follow-up for Phone Call        Hydromet 1 tsp every 6 hrs as needed #120cc Follow-up by: Marga Melnick MD,  April 09, 2010 1:16 PM  Additional Follow-up for Phone Call Additional follow up Details #1::        Informed pt that Dr.Hopper was sending in [facility], pt request that Dr.Hopper call him before anything is done today. Additional Follow-up by: Army Fossa CMA,  April 09, 2010 3:10 PM    Additional Follow-up for Phone Call Additional follow up Details #2::    I left message stating no med cause of cough (no ACE-I) & no PMH of asthma. Rec Hydromet then CXray & spirometry if no better. Follow-up by: Marga Melnick MD,  April 10, 2010 1:21 PM  New/Updated Medications: HYDROMET 5-1.5 MG/5ML SYRP (HYDROCODONE-HOMATROPINE) 1 tsp every 6 hrs as needed Prescriptions: HYDROMET 5-1.5 MG/5ML SYRP (HYDROCODONE-HOMATROPINE) 1 tsp every 6 hrs as needed  #120cc x 0   Entered by:   Lucious Groves CMA   Authorized by:   Marga Melnick MD   Signed by:   Lucious Groves CMA on 04/09/2010   Method used:   Printed then faxed to ...       [facility]* (retail)       [address]       [place], [place]  [id number]       Ph: [phone]       Fax: [phone]   RxID:   [id number]

## 2010-07-23 NOTE — Assessment & Plan Note (Signed)
Summary: f2y   Visit Type:  Follow-up Primary Provider:  Marga Melnick MD  CC:  CAD.  History of Present Illness: The patient is seen for cardiology followup.  He is known coronary disease.  He underwent Taxus stent in 2004.  He had a nuclear stress test in 2006 that showed no ischemia.  At that time he had persistent sinus tachycardia at peak stress.  I reviewed the strips at that time and there is no definite evidence of a supraventricular arrhythmia.  He stabilized and no further problems.  He remembers this and wonders if it was related to the nuclear material.  I doubt that this was the case.  The patient has had some exertional shortness of breath.  He has is primarily when walking up stairs.  His exercise regimen has been limited and he has gained some weight.  He's not had any significant chest pain recently.  He did have one episode that took him to the emergency room many months ago.  Current Medications (verified): 1)  Nasacort Aq 55 Mcg/act  Aers (Triamcinolone Acetonide(Nasal)) .... Use 2 Sprays Each Nostril Once A Day For Runny Nose and Drainage 2)  Adult Aspirin Low Strength 81 Mg  Tbdp (Aspirin) .Marland Kitchen.. 1 By Mouth Once Daily 3)  Uroxatral 10 Mg  Tb24 (Alfuzosin Hcl) .Marland Kitchen.. 1 By Mouth Once Daily 4)  Crestor 20 Mg  Tabs (Rosuvastatin Calcium) .Marland Kitchen.. 1 Qd 5)  Avodart 0.5 Mg  Caps (Dutasteride) .Marland Kitchen.. 1 By Mouth 6)  Lovaza 1 Gm Caps (Omega-3-Acid Ethyl Esters) .... 2 Pills Two Times A Day 7)  Allegra 180 Mg Tabs (Fexofenadine Hcl) .Marland Kitchen.. 1 Once Daily As Needed 8)  Glucosamine Msm Complex  Tabs (Glucos-Msm-C-Mn-Ginger-Willow) .... 2 By Mouth Daily 9)  Multivitamins   Tabs (Multiple Vitamin) .Marland Kitchen.. 1 By Mouth Daily  Allergies (verified): 1)  ! Sulfa  Past History:  Past Medical History: Allergic Rhinitis; ? asthma variant Hyperlipidemia Benign prostatic hypertrophy, Dr Aldean Ast Colonic polyps, hx of Coronary artery disease, Dr Jerral Bonito.Marland KitchenMarland KitchenTaxus stent 2004 /  nuclear.. 2006.. no  ischemia...EF 63% EF 63%.... nuclear..... 2006 Skin cancer, hx of, Dr Karlyn Agee, Derm Cataract, early Shortness of breath..... climbing stairs.... July, 2011  Review of Systems       Patient denies fever, chills, headache, sweats, rash, change in vision, change in hearing, cough, nausea vomiting, urinary symptoms.  All other systems are reviewed and are negative.  Vital Signs:  Patient profile:   65 year old male Height:      74 inches Weight:      223 pounds BMI:     28.73 Pulse rate:   81 / minute Resp:     16 per minute BP sitting:   133 / 82  (left arm)  Vitals Entered By: Marrion Coy, CNA (January 01, 2010 8:50 AM)  Physical Exam  General:  Patient is stable. Head:  head is atraumatic. Eyes:  no xanthelasma. Neck:  no carotid bruits. Chest Wall:  no chest wall tenderness. Lungs:  lungs are clear.  Respiratory effort is nonlabored. Heart:  cardiac exam reveals S1-S2.  No clicks or significant murmurs. Abdomen:  abdomen is soft. Msk:  no musculoskeletal deformities. Extremities:  no peripheral edema. Skin:  no skin rashes. Psych:  patient is oriented to person time and place.  Affect is normal.   Impression & Recommendations:  Problem # 1:  SHORTNESS OF BREATH (ICD-786.05)  The patient is having shortness of breath when he climbs stairs.  There  is no definite chest pain.  He does have documented coronary disease and we will proceed with a stress echo.  He will also have a standard complete echo to be sure that there are no other abnormalities.  Orders: Stress Echo (Stress Echo) Echocardiogram (Echo) EKG w/ Interpretation (93000)  Problem # 2:  CORONARY ARTERY DISEASE (ICD-414.00)  Coronary disease appears to be stable.  EKG is done today and reviewed by me.  The EKG is normal.  We will proceed with a stress echo to be sure that his shortness of breath is not an anginal equivalent.  Orders: Stress Echo (Stress Echo) Echocardiogram (Echo) EKG w/ Interpretation  (93000)  Problem # 3:  HYPERLIPIDEMIA (ICD-272.4) Patient is on meds for his lipids.  I will see him back for followup after his echo and stress echo.  Patient Instructions: 1)  Your physician has requested that you have a stress echocardiogram. For further information please visit [email or web address]  Please follow instruction sheet as given. 2)  Your physician has requested that you have an echocardiogram.  Echocardiography is a painless test that uses sound waves to create images of your heart. It provides your doctor with information about the size and shape of your heart and how well your heart's chambers and valves are working.  This procedure takes approximately one hour. There are no restrictions for this procedure. 3)  Follow up after test are done

## 2010-07-23 NOTE — Progress Notes (Signed)
Summary: Nexium request  Phone Note Call from Patient Call back at Home Phone [phone]   Summary of Call: Patient called back stating that he does not want to take a generic, he wants to take Nexium. I previously made him aware that insurance usually requires he try generic first, and patient states that he has called them. Ok to change to Nexium? Initial call taken by: Lucious Groves CMA,  April 10, 2010 3:31 PM  Follow-up for Phone Call        Patient notified to take 30 min before meals. Follow-up by: Lucious Groves CMA,  April 10, 2010 3:52 PM    New/Updated Medications: NEXIUM 40 MG CPDR (ESOMEPRAZOLE MAGNESIUM) 1 by mouth two times a day Prescriptions: NEXIUM 40 MG CPDR (ESOMEPRAZOLE MAGNESIUM) 1 by mouth two times a day  #60 x 3   Entered by:   Lucious Groves CMA   Authorized by:   Marga Melnick MD   Signed by:   Lucious Groves CMA on 04/10/2010   Method used:   Faxed to ...       [facility]* (retail)       [address]       [place], [place]  [address]       Ph: [phone]       Fax: [phone]   RxID:   [id number]

## 2010-07-23 NOTE — Assessment & Plan Note (Signed)
Summary: for wheezing//ph   Vital Signs:  Patient profile:   65 year old male Weight:      225.8 pounds BMI:     29.10 O2 Sat:      97 % Temp:     98.3 degrees F oral Resp:     15 per minute BP sitting:   132 / 80  (left arm) Cuff size:   large  Vitals Entered By: Shonna Chock (August 20, 2009 8:12 AM) CC: Wheezing Comments REVIEWED MED LIST, PATIENT AGREED DOSE AND INSTRUCTION CORRECT    CC:  Wheezing.  History of Present Illness: Onset X 1 month as vague "SOB" ,especially with stairs, not @ rest. Allergy flare last month with productive cough; cough is basically perennial. It improved in [place] in January. Rx: Allegra, Xyzal as needed, Nasacort AQ at bedtime . PMH of "a form of asthma" in 2003 as perPFTs by  Dr Andria Meuse; Advair of ? benefit.Dr Jearld Fenton, ENT dagnosed ERD as component of PNDrainage/ cough syndrome.PPI & HOB elevation Rxed. Weight up 5#. PMH of CAD , S/P stent. Dr Myrtis Ser last seen > 1 year.  Allergies: 1)  ! Sulfa  Past History:  Past Medical History: Allergic Rhinitis; ? asthma variant Hyperlipidemia Benign prostatic hypertrophy, Dr Aldean Ast Colonic polyps, hx of Coronary artery disease, Dr Jerral Bonito Skin cancer, hx of, Dr Karlyn Agee, Derm Cataract, early  Review of Systems General:  Denies chills, fever, and sweats. ENT:  Denies difficulty swallowing, hoarseness, nasal congestion, sinus pressure, and sore throat; No frontal headache, facial pain  or purulence. CV:  Complains of shortness of breath with exertion and swelling of feet; denies bluish discoloration of lips or nails, chest pain or discomfort, difficulty breathing at night, difficulty breathing while lying down, leg cramps with exertion, and swelling of hands; ? ankle edema. Resp:  Complains of sputum productive and wheezing; denies chest pain with inspiration, coughing up blood, excessive snoring, hypersomnolence, and morning headaches; Scant ,clear phlegm. GI:  Denies abdominal pain, bloody stools, dark  tarry stools, and indigestion. Allergy:  Denies itching eyes and sneezing.  Physical Exam  General:  well-nourished,in no acute distress; alert,appropriate and cooperative throughout examination Ears:  External ear exam shows no significant lesions or deformities.  Otoscopic examination reveals clear canals, tympanic membranes are intact bilaterally without bulging, retraction, inflammation or discharge. Hearing is grossly normal bilaterally. Nose:  External nasal examination shows no deformity or inflammation. Nasal mucosa are pink and moist without lesions or exudates. Mouth:  Oral mucosa and oropharynx without lesions or exudates.  Teeth in good repair. Lungs:  Normal respiratory effort, chest expands symmetrically. Lungs are clear to auscultation, no crackles or wheezes. Heart:  Normal rate and regular rhythm. S1 and S2 normal without gallop, murmur, click, rub. S4 Abdomen:  Bowel sounds positive,abdomen soft and non-tender without masses, organomegaly or hernias noted. Pulses:  R and L carotid,radial,dorsalis pedis and posterior tibial pulses are full and equal bilaterally Extremities:  No clubbing, cyanosis, edema, or deformity noted  Neurologic:  alert & oriented X3.   Skin:  Intact without suspicious lesions or rashes Cervical Nodes:  No lymphadenopathy noted Axillary Nodes:  No palpable lymphadenopathy Psych:  memory intact for recent and remote, normally interactive, and good eye contact.     Impression & Recommendations:  Problem # 1:  DYSPNEA/SHORTNESS OF BREATH (ICD-786.09)  R/O RAD vs ERD vs CAD etiology  Orders: EKG w/ Interpretation (93000) T-2 View CXR (71020TC)  His updated medication list for this problem  includes:    Symbicort 160-4.5 Mcg/act Aero (Budesonide-formoterol fumarate) .Marland Kitchen... 1-2 puffs two times a day ; gargle & spit after use  Problem # 2:  CORONARY ARTERY DISEASE (ICD-414.00)  His updated medication list for this problem includes:    Adult Aspirin  Low Strength 81 Mg Tbdp (Aspirin) .Marland Kitchen... 1 by mouth once daily  Orders: EKG w/ Interpretation (93000)  Problem # 3:  GERD (ICD-530.1)  Problem # 4:  RHINITIS (ICD-477.9)  His updated medication list for this problem includes:    Nasacort Aq 55 Mcg/act Aers (Triamcinolone acetonide(nasal)) ..... Use 2 sprays each nostril once a day for runny nose and drainage    Xyzal 5 Mg Tabs (Levocetirizine dihydrochloride) .Marland Kitchen... 1 by mouth once daily as needed    Allegra 180 Mg Tabs (Fexofenadine hcl) .Marland Kitchen... 1 once daily as needed  Orders: Venipuncture (44034) TLB-CBC Platelet - w/Differential (85025-CBCD)  Complete Medication List: 1)  Nasacort Aq 55 Mcg/act Aers (Triamcinolone acetonide(nasal)) .... Use 2 sprays each nostril once a day for runny nose and drainage 2)  Adult Aspirin Low Strength 81 Mg Tbdp (Aspirin) .Marland Kitchen.. 1 by mouth once daily 3)  Uroxatral 10 Mg Tb24 (Alfuzosin hcl) .Marland Kitchen.. 1 by mouth once daily 4)  Xyzal 5 Mg Tabs (Levocetirizine dihydrochloride) .Marland Kitchen.. 1 by mouth once daily as needed 5)  Crestor 20 Mg Tabs (Rosuvastatin calcium) .Marland Kitchen.. 1 qd 6)  Avodart 0.5 Mg Caps (Dutasteride) .Marland Kitchen.. 1 by mouth 7)  Lovaza 1 Gm Caps (Omega-3-acid ethyl esters) .... 2 pills two times a day 8)  Allegra 180 Mg Tabs (Fexofenadine hcl) .Marland Kitchen.. 1 once daily as needed 9)  Omeprazole 20 Mg (omeprazole)  .Marland Kitchen.. 1 two times a day pre meals 10)  Symbicort 160-4.5 Mcg/act Aero (Budesonide-formoterol fumarate) .Marland Kitchen.. 1-2 puffs two times a day ; gargle & spit after use  Patient Instructions: 1)  Avoid foods high in acid (tomatoes, citrus juices, spicy foods). Avoid eating within two hours of lying down or before exercising. Do not over eat; try smaller more frequent meals. Elevate head of bed twelve inches when sleeping. Prescriptions: SYMBICORT 160-4.5 MCG/ACT AERO (BUDESONIDE-FORMOTEROL FUMARATE) 1-2 puffs two times a day ; gargle & spit after use  #1 x 0   Entered and Authorized by:   Marga Melnick MD   Signed by:    Marga Melnick MD on 08/20/2009   Method used:   Samples Given   RxID:   [id number] OMEPRAZOLE 20 MG  (OMEPRAZOLE) 1 two times a day pre meals  #60 x 1   Entered and Authorized by:   Marga Melnick MD   Signed by:   Marga Melnick MD on 08/20/2009   Method used:   Print then Give to Patient   RxID:   [id number]

## 2010-07-23 NOTE — Progress Notes (Signed)
Summary: Refill Request  Phone Note Refill Request Call back at [phone] Message from:  Pharmacy on June 23, 2010 10:26 AM  Refills Requested: Medication #1:  NASACORT AQ 55 MCG/ACT  AERS use 2 sprays each nostril once a day for runny nose and drainage   Dosage confirmed as above?Dosage Confirmed   Supply Requested: 1 month [facility]  Next Appointment Scheduled: none Initial call taken by: Harold Barban,  June 23, 2010 10:26 AM    Prescriptions: NASACORT AQ 55 MCG/ACT  AERS (TRIAMCINOLONE ACETONIDE(NASAL)) use 2 sprays each nostril once a day for runny nose and drainage  #16.50 Each x 3   Entered by:   Shonna Chock CMA   Authorized by:   Marga Melnick MD   Signed by:   Shonna Chock CMA on 06/23/2010   Method used:   Electronically to        [facility]* (retail)       [address]       [place], [place]  [id number]       Ph: [phone]       Fax: [phone]   RxID:   [id number]

## 2010-09-28 LAB — DIFFERENTIAL
Lymphocytes Relative: 41 % (ref 12–46)
Lymphs Abs: 2.4 10*3/uL (ref 0.7–4.0)
Monocytes Relative: 8 % (ref 3–12)
Neutro Abs: 2.7 10*3/uL (ref 1.7–7.7)
Neutrophils Relative %: 47 % (ref 43–77)

## 2010-09-28 LAB — CBC
HCT: 40 % (ref 39.0–52.0)
Hemoglobin: 13.8 g/dL (ref 13.0–17.0)
MCHC: 34.6 g/dL (ref 30.0–36.0)
Platelets: 195 10*3/uL (ref 150–400)
RDW: 13 % (ref 11.5–15.5)

## 2010-09-28 LAB — COMPREHENSIVE METABOLIC PANEL
BUN: 19 mg/dL (ref 6–23)
Calcium: 8.9 mg/dL (ref 8.4–10.5)
Glucose, Bld: 136 mg/dL — ABNORMAL HIGH (ref 70–99)
Total Protein: 6.6 g/dL (ref 6.0–8.3)

## 2010-09-28 LAB — POCT CARDIAC MARKERS
CKMB, poc: <1 ng/mL — ABNORMAL LOW (ref 1.0–8.0)
Myoglobin, poc: 33.6 ng/mL (ref 12–200)
Troponin i, poc: <0.05 ng/mL (ref 0.00–0.09)

## 2010-11-03 NOTE — Assessment & Plan Note (Signed)
[facility]                            CARDIOLOGY OFFICE NOTE   ANUSH, WIEDEMAN                     MRN:          [id number]  DATE:11/22/2007                            DOB:          [birth year]    HISTORY OF PRESENT ILLNESS:  Mr. Hoar is here for cardiology follow-  up.  He has known coronary disease.  I saw him last in 2006.  He has  been very stable.  He follows carefully with Dr. Alwyn Ren.  He is on  medications for cholesterol.  He is retired, and he is exercising  regularly.  He is trying to lose some weight.  He has seen our pulmonary  team also.   He is not having any chest pain.  He has no shortness of breath.  There  is no syncope or presyncope.   The patient received a Taxus stent to the circumflex in 2004.  He had  been on Plavix, but has not been on Plavix since 2006.  He had a stress  test in October 2006.  At that time, he walked on the treadmill.  There  was no scar or ischemia, and he had a normal ejection fraction.   PAST MEDICAL HISTORY:  SULFA.   MEDICATIONS:  Aspirin, Nasacort, Uroxatral and Crestor 40 mg daily.   OTHER MEDICAL PROBLEMS:  See the list below.   REVIEW OF SYSTEMS:  At this time, his review of systems is negative.   PHYSICAL EXAMINATION:  VITAL SIGNS:  Weight is 217 pounds.  Blood  pressure is 140/85 with a pulse of 83.  The patient is oriented to  person, time and place.  Affect is normal.  HEENT:  Reveals no xanthelasma.  He has normal extraocular motion.  There are no carotid bruits.  There is no jugular venous distention.  LUNGS:  Clear.  Respiratory effort is not labored.  CARDIAC:  Exam reveals S1-S2.  There are no clicks or significant  murmurs.  ABDOMEN:  Soft.  He has no masses or bruits.  He has no significant  peripheral edema.  There are no major musculoskeletal deformities.   STUDIES:  EKG with normal sinus rhythm and no acute change.   ASSESSMENT:  Mr. Cronkright is stable.  It has been  somewhat over 3 years  since I saw him last.  However, he does not need any exercise testing at  this time.  We have agreed that I will see him again in 2 years.  He  will continue to exercise and to take his medications.  Hopefully, he  will lose some weight.   PROBLEMS:  1. Coronary disease plus Taxus stent in 2004 with exercise test in      2006 with no ischemia.  2. Normal LV function.  3. Hyperlipidemia.  He is on a strong dose of Crestor.  His HDL has      been low in the past.  He is hesitant to use Niaspan.  4. History of some prostate difficulties treated.  5. History of cough with asthma and sinusitis and some reflux.  6. Borderline blood  pressure, controlled.  7. Borderline glucose over time, and at this point, he is not on a      medication for his glucose.    Mr. Bacallao is stable at this time.  We talked carefully about weight  loss and about whether he should or should not be on Niaspan.  He will  continue full activities.  I will see him back in 24 months.     Luis Abed, MD, [facility]  Electronically Signed    JDK/MedQ  DD: 11/22/2007  DT: 11/22/2007  Job #: [id number]   cc:   Titus Dubin. Alwyn Ren, MD,FACP,FCCP

## 2010-11-06 NOTE — Assessment & Plan Note (Signed)
[facility]                             PULMONARY OFFICE NOTE   Jerry Simpson, Jerry Simpson                       MRN:          [id number]  DATE:06/20/2006                            DOB:          1946-05-31    PULMONARY/ACUTE EXTENDED OFFICE VISIT   HISTORY:  A 65 year old white male with chronic cough dating back 30  years consisting mostly of excessive throat clearing, that seems  somewhat better with treatment directed rhinitis and reflux, but never  completely resolved.  He comes in now, acutely worse for the last 6  weeks' with the sensation of postnasal drainage, but actually no excess  sputum production.  He complains of mild dyspnea with activity, but no  nocturnal wheezing, cough, fever, chills, sweats, leg swelling or chest  pain.   His medications are reviewed with him in detail.  Noted on patient's  inventory list date 06/20/2006.  Note that although he takes AcipHex, he  uses it at bedtime only.   PHYSICAL EXAMINATION:  On physical examination, he is a hoarse  ambulatory white male with classic voice fatigue.  He has normal vital signs.  HEENT:  Reveals minimal turbinate edema.  Oropharynx is clear.  There is  no evidence of excess postnasal drip.  NECK:  Supple without cervical adenopathy or tenderness.  Trachea is  midline. No thyromegaly.  Lung fields are completely clear bilaterally to auscultation and  percussion with excellent air movement.  There is a regular rate and rhythm without murmur, gallop, or rub  present.  ABDOMEN:  Soft and benign with no palpable organomegaly, masses, or  tenderness.  EXTREMITIES:  Warm without calf tenderness, clubbing, cyanosis, or  edema.   IMPRESSION:  Classic upper airway cough syndrome, either related to  rhinitis, reflux, or both.  He now appears to have a cyclic pattern  cough that may have started with a viral upper respiratory infection or  underlying sinusitis.  Extended discussion with  the patient lasted 15  minutes in a 25-minute visit regarding the nature of cyclical egophony  and the workup including trying to find a cause but also eliminating the  cycle of cough that contributes to the persistent symptoms.   RECOMMENDATIONS:  1. Substitute Protonix for AcipHex and take it perfectly regularly      before his first and last meal until 100% better; and then continue      1 daily thereafter taking 30-60 minutes before breakfast.  2. Prednisone 10 mg tablets #14 to be tapered off over 6 days and      Delsym combined with tramadol to suppress excessive coughing.   If the cough continues, the next step would be to return for a sinus CT  scan and a chest x-ray.     Charlaine Dalton. Sherene Sires, MD, [facility]  Electronically Signed    MBW/MedQ  DD: 06/22/2006  DT: 06/22/2006  Job #: [id number]

## 2010-11-06 NOTE — Discharge Summary (Signed)
NAME:  Jerry Simpson, Jerry Simpson                        ACCOUNT NO.:  [id number]   MEDICAL RECORD NO.:  [id number]                   PATIENT TYPE:  INP   LOCATION:  6532                                 FACILITY:  [facility]   PHYSICIAN:  Rollene Rotunda, M.D.                DATE OF BIRTH:  25-Feb-1946   DATE OF ADMISSION:  11/19/2002  DATE OF DISCHARGE:  11/21/2002                           DISCHARGE SUMMARY - REFERRING   PROCEDURE:  Coronary angiogram/stent CFX June 1.   REASON FOR ADMISSION:  Please refer to dictated note.   LABORATORY DATA:  Cardiac enzymes:  CPK/MB and troponin-I enzymes normal  (x3).  Electrolytes and renal function normal at discharge.  CBC normal at  discharge.  Lipid profile pending.  Admission chest x-ray - no active disease.   HOSPITAL COURSE:  Following admission, his symptoms worsened for unstable  angina pectoris and the patient ruled out for MI with normal serial cardiac  enzymes.  Given his significant cardiac risk factors and reported history of  a negative routine treadmill test earlier in the year, recommendation was to  proceed with diagnostic coronary angiography.  Cardiac catheterization was  performed by Dr. Bonnee Quin (see report for full details), revealed severe  single vessel disease with 90% proximal CFX lesion.  Residual anatomy  notable for 40% proximal first diagonal with otherwise normal LAD and RCA.  Left ventriculography was normal.   Dr. Riley Kill proceeded with successful stenting (Taxus) of the 90% CFX lesion  with no noted complications.  The patient was treated with heparin and  Integrilin, kept for overnight observation, and cleared for discharge the  following morning in hemodynamically stable condition.   MEDICATIONS ADJUSTMENTS THIS ADMISSION:  Up titration of Lipitor to 20 mg  daily, addition of Plavix (x6 months).   DISCHARGE MEDICATIONS:  1. Plavix 75 mg daily (x6 months).  2. Coated aspirin 325 mg daily.  3. Lipitor 20 mg  daily.  4. Nitrostat 0.4 mg p.r.n.   DISCHARGE INSTRUCTIONS:  1. Low fat, low cholesterol diet.  2. Call the office if there is any swelling/bleeding in the groin.  3. The patient is scheduled to follow up with Dr. Willa Rough on July 7, at     9:15 a.m.  The patient will need follow up liver enzymes/lipid profile in     approximately eight weeks.   DISCHARGE DIAGNOSES:  1. Severe single-vessel coronary artery disease.     A. Normal cardiac enzymes.     B.        Status post (Taxus) 90% circumflex.     C. Normal left ventricle.  2. Dyslipidemia.     Gene Serpe, P.A. [facility]                      Rollene Rotunda, M.D.    GS/MEDQ  D:  11/21/2002  T:  11/21/2002  Job:  [id number]   cc:  Eric L. August Saucer, M.D.  [address] [address]  [place]  [place] [address]  Fax: [phone]

## 2010-11-06 NOTE — Assessment & Plan Note (Signed)
[facility]                             PULMONARY OFFICE NOTE   PASCHAL, BLANTON                     MRN:          [id number]  DATE:05/31/2006                            DOB:          [birth year]    Mr. Mcfetridge is a 65 year old white male, history of cyclic cough,  postnasal drip syndrome, vocal cord dysfunction, allergic rhinitis and  reflux disease.  He did well until a week ago when he had increased  cough.  He has been having a dry cough for 1 month, but increased  congestion and throat clearing.  He saw a primary care Shaquille Murdy last  week, who did not make changes.  His mucus now is clear.  He has  increased postnasal drainage.  He is not using his Nasacort regularly.  He is no longer on an antihistamine.  He is on no cough suppression.  He  is using fish oil daily, Vytorin daily, multivitamin and aspirin daily,  81 mg, Toprol XL 12.5 one half daily, UroXatral daily and Avodart daily.   EXAMINATION:  Temperature is 97.  Blood pressure 116/80.  Pulse 71.  Saturation 95% on room air.  CHEST:  Showed to be clear without evidence of wheeze or rhonchi.  CARDIAC:  Exam showed a regular rate and rhythm without S3.  Normal S1  and S2.  ABDOMEN:  Soft and non-tender.  EXTREMITIES:  Showed no edema or clubbing.  SKIN:  Clear.  NEUROLOGIC:  Exam was intact.  Nares showed mild nasal inflammation.  No purulence.  Oropharynx showed  postnasal drainage.   IMPRESSION:  1. Allergic rhinitis.  2. Cyclic cough.  3. Mild reflux disease.   RECOMMENDATIONS:  1. Increase Nasacort to 2 sprays twice a day for a week, then down to      2 sprays daily thereafter.  2. Begin Xyzal 1 daily.  3. Use saline flush lavage to sinuses.  4. Use Tessalon pearls as needed.  5. Discontinue the fish oil.  6. No systemic antibiotics indicated.  7. Return the patient in 2 months.     Charlcie Cradle Delford Field, MD, [facility]  Electronically Signed    PEW/MedQ  DD: 06/01/2006  DT:  06/01/2006  Job #: [id number]

## 2010-11-06 NOTE — Cardiovascular Report (Signed)
NAME:  Jerry Simpson, Jerry Simpson                        ACCOUNT NO.:  [id number]   MEDICAL RECORD NO.:  [id number]                   PATIENT TYPE:  INP   LOCATION:  3738                                 FACILITY:  [facility]   PHYSICIAN:  Arturo Morton. Riley Kill, M.D.             DATE OF BIRTH:  1945/12/22   DATE OF PROCEDURE:  11/20/2002  DATE OF DISCHARGE:                              CARDIAC CATHETERIZATION   INDICATIONS:  The patient is a 65 year old gentleman well known to me.  He  has had hypercholesterolemia and has been on Lipitor.  He presented with  some exertional chest discomfort yesterday.  He was admitted and cardiac  catheterization recommended.  He was brought to the catheterization  laboratory for further evaluation.   PROCEDURE:  1. Left heart catheterization.  2. Selective coronary arteriography.  3. Selective left ventriculography.  4. PTCA and stenting of the circumflex coronary artery.   DESCRIPTION OF PROCEDURE:  The patient was brought to the catheterization  laboratory and prepped and draped through the usual fashion.  Through an  anterior puncture the right femoral artery was entered.  A 6-French sheath  was placed.  Ventriculography was performed in the RAO projection and  pressures measured with pullback pressure recorded.  Views of the left and  right coronary arteries were then obtained in multiple angiographic  projections.  The patient was noted to have a high grade stenosis in the  circumflex coronary artery.  I discussed the options with the patient.  It  was our recommendation to proceed on to percutaneous stenting of this  vessel.  Preparations were then made for this.  The patient received Lovenox  nearly 10 hours earlier so he was given a slightly reduced dose of heparin  and an ACT recorded to be in excess of 200 seconds.  Integrilin was also  administered according to protocol.  Following this a JL4 guiding catheter  was utilized to intubate the vessel.  A  high torque floppy wire was used.  We directly stented the vessel using a 16 x 2.75 [employer] Taxus  drug eluding stent.  This was taken up to about 12 atmospheres.  With  deflation of balloon it was difficult to remove the balloon from the stent  itself.  We were ultimately able to get the balloon out but also the wire  came out as well.  The lesion was then recrossed with a BMW wire.  We were  unable to get the post dilatation balloon back across the artery and  therefore a high torque floppy wire and a buddy wire system was placed  across the vessel which allowed [place] to navigate into this area.  We then did  several post dilatations up to 12 atmospheres using a 3.25 x 8 PowerSail  balloon.  There was marked and substantial improvement in the appearance of  the artery.  The patient was given 2 mg of Nubain  because of some back  discomfort.  He was also given some intracoronary nitroglycerin about this  time then he developed some bradycardia and hypotension.  This was reversed  with 1 mg of atropine and fluids.  He was taken to the holding area then and  all catheters were then subsequently removed after final two shots.  The  femoral sheath was sewn into place.  He was taken to the holding area in  satisfactory clinical condition.   HEMODYNAMIC DATA:  1. Central aorta 146/86 with a mean of 111.  2. Left ventricle 127/10.  3. No aortic to left ventricular gradient on pullback across the aortic     valve.   ANGIOGRAPHIC DATA:  1. Ventriculography was performed in the RAO projection.  Overall systolic     function was well preserved and no segmental abnormalities of contraction     were identified.  Ejection fraction was calculated at 63%.  2. The left main coronary artery was free of critical disease.  3. The left anterior descending coursed to the apex.  There was a fair     amount of minor luminal irregularity throughout the LAD, but no high     grade areas of focal  stenosis.  There were two major diagonal branches,     the first of which had probably about 40% proximal irregularity near the     ostium.  4. The circumflex provided a tiny first marginal branch which was free of     critical disease.  There was then a 90% focal stenosis that was across     the origin of smaller marginal branch which had a fair amount of diffuse     irregularity.  Just beyond the marginal branch there was about 30%     narrowing and then the vessel bifurcated into a very large marginal     branch and a smaller subbranch that had about a 70-80% ostial stenosis at     its takeoff from the large subbranch.  The proximal stenosis of 90% was     stented with a 16 mm length stent and there was 0% residual at completion     of the procedure.  5. The right coronary artery was a dominant vessel.  It provided a PDA and     posterolateral branch both of which were large and free of critical     disease.   CONCLUSIONS:  1. Successful percutaneous stenting of the circumflex coronary artery.  2. Hypotension possibly related to Nubain administration.   DISPOSITION:  The patient will be treated medically.  Appropriate  instructions for stent management will be given.  Follow-up will be with  Willa Rough, M.D.                                               Arturo Morton. Riley Kill, M.D.    TDS/MEDQ  D:  11/20/2002  T:  11/20/2002  Job:  [id number]   cc:   Willa Rough, M.D.   CV Lab   Lind Guest. August Saucer, M.D.  [address] [address]  [place]  [place] [address]  Fax: [phone]

## 2010-11-06 NOTE — H&P (Signed)
NAME:  Jerry Simpson, Jerry Simpson                        ACCOUNT NO.:  [id number]   MEDICAL RECORD NO.:  [id number]                   PATIENT TYPE:  INP   LOCATION:  1824                                 FACILITY:  [facility]   PHYSICIAN:  Rollene Rotunda, M.D.                DATE OF BIRTH:  10-17-1945   DATE OF ADMISSION:  11/19/2002  DATE OF DISCHARGE:                                HISTORY & PHYSICAL   PRIMARY CARE PHYSICIAN:  Eric L. August Saucer, M.D.   REASON FOR ADMISSION:  Chest pain.   HISTORY OF PRESENT ILLNESS:  The patient is a 65 year old gentleman whose  past cardiac history includes a treadmill six months ago.  This was  apparently negative for any evidence of ischemia.  This was done as a  routine given risk factors.  He says that he has been doing well.  He spent  a week at the beach.  Yesterday evening when he was returning home and  carrying some luggage up the stairs he noticed substernal chest discomfort.  He cannot qualify it, but noted it to be somewhat mild.  It stopped after he  stopped what he was doing.  Today while mowing the lawn, he again got this  chest discomfort with radiation to his left arm.  There were no associated  symptoms of nausea, vomiting, or diaphoresis.  He did not have palpitations,  presyncope, or syncope.  He has had no PND or orthopnea.  He thought it was  somewhat mild.  He did not have discomfort like this in the past.   PAST MEDICAL HISTORY:  Hyperlipidemia (he was noted to have elevated  triglycerides 15 years ago).  There is no history of hypertension or  diabetes.   PAST SURGICAL HISTORY:  1. Tonsillectomy.  2. Right clavicular fracture from falling off a horse.   ALLERGIES:  SULFA.   MEDICATIONS:  1. Lipitor 10 mg p.o. daily.  2. Aspirin 81 mg p.o. daily.   SOCIAL HISTORY:  The patient lives in [place].  He is a Web designer.  He is married.  He has three daughters who are adults.  He  has no grandchildren.  He has  never smoked cigarettes.  He occasionally  drinks alcohol.   FAMILY HISTORY:  Contributory for his brother having myocardial infarction  at age 78.   REVIEW OF SYMPTOMS:  As stated in the HPI, otherwise negative for other  systems.   PHYSICAL EXAMINATION:  GENERAL:  The patient is in no distress.  VITAL SIGNS:  Blood pressure 130/84, heart rate 74 and regular.  HEENT:  Eyes are unremarkable.  Pupils equal, round, reactive to light.  Fundi not visualized.  Oral mucosa unremarkable.  NECK:  No jugular venous distention, wave form within normal limits.  Carotid upstrokes brisk and symmetrical.  No bruits, thyromegaly, cervical,  axillary, or inguinal adenopathy.  LUNGS:  Clear to auscultation bilaterally.  BACK:  No costovertebral angle tenderness.  CHEST:  Unremarkable.  HEART:  PMI displaced with sustained S1 and S2 within normal limits, no S3,  no S4, no murmurs.  ABDOMEN:  Flat, positive bowel sounds, normal in frequency and pitch, no  rebound, guarding, bruits, or midline pulsatile mass, no hepatosplenomegaly.  SKIN:  No rash.  EXTREMITIES:  Pulses are 2+ throughout.  No edema.  NEUROLOGIC:  Oriented to person, place, and time.  Cranial nerves II-XII  intact.  Motor grossly intact.   LABORATORY DATA:  EKG shows sinus rhythm, axis within normal limits,  intervals within normal limits, no acute ST-T wave changes.   LABORATORIES PENDING:  Chest x-ray pending.   ASSESSMENT AND PLAN:  1. Chest pain.  The patient's chest pain is suggestive of new onset     exertional angina (unstable angina).  He has cardiovascular risk factors     with a family history.  Given this, I think the next step should be     cardiac catheterization.  The patient understands the risks and benefits     and agrees to proceed.  He will be treated with aspirin and Lovenox.  2. Hyperlipidemia.  We will check a lipid profile and we will continue his     Lipitor.                                                Rollene Rotunda, M.D.    JH/MEDQ  D:  11/19/2002  T:  11/19/2002  Job:  [id number]   cc:   Minerva Areola L. August Saucer, M.D.  [address] [address]  [place]  [place] [address]  Fax: [phone]

## 2011-03-30 ENCOUNTER — Ambulatory Visit (INDEPENDENT_AMBULATORY_CARE_PROVIDER_SITE_OTHER): Payer: Managed Care, Other (non HMO) | Admitting: Internal Medicine

## 2011-03-30 ENCOUNTER — Other Ambulatory Visit: Payer: Self-pay | Admitting: Internal Medicine

## 2011-03-30 DIAGNOSIS — Z0000 Encounter for general adult medical examination without abnormal findings: Secondary | ICD-10-CM

## 2011-03-30 DIAGNOSIS — Z23 Encounter for immunization: Secondary | ICD-10-CM

## 2011-04-12 ENCOUNTER — Encounter: Payer: Self-pay | Admitting: Internal Medicine

## 2011-06-30 ENCOUNTER — Other Ambulatory Visit: Payer: Self-pay | Admitting: Internal Medicine

## 2011-06-30 MED ORDER — PANTOPRAZOLE SODIUM 40 MG PO TBEC: 40.0000 mg | DELAYED_RELEASE_TABLET | Freq: Every day | ORAL | Status: DC

## 2011-06-30 NOTE — Telephone Encounter (Signed)
RX sent

## 2011-07-16 ENCOUNTER — Ambulatory Visit (AMBULATORY_SURGERY_CENTER): Payer: Managed Care, Other (non HMO) | Admitting: *Deleted

## 2011-07-16 VITALS — Ht 74.0 in | Wt 224.5 lb

## 2011-07-16 DIAGNOSIS — Z1211 Encounter for screening for malignant neoplasm of colon: Secondary | ICD-10-CM

## 2011-07-16 MED ORDER — PEG-KCL-NACL-NASULF-NA ASC-C 100 G PO SOLR: ORAL | Status: DC

## 2011-07-23 ENCOUNTER — Telehealth: Payer: Self-pay | Admitting: Internal Medicine

## 2011-07-23 DIAGNOSIS — Z0000 Encounter for general adult medical examination without abnormal findings: Secondary | ICD-10-CM

## 2011-07-23 DIAGNOSIS — Z87438 Personal history of other diseases of male genital organs: Secondary | ICD-10-CM

## 2011-07-23 NOTE — Telephone Encounter (Signed)
Discuss with patient, orders put in.

## 2011-07-23 NOTE — Telephone Encounter (Signed)
PSA only if prostate has been abnormal or if there is a strong family history. Other tests were in the [facility] health panel done 2012 & do not need to be repeated. V70.0 labs:lipids,BMET, CBC&dif, TSH , hepatic panel

## 2011-07-23 NOTE — Telephone Encounter (Signed)
Spoke with patient, Dr.Hopper please advise, patient read an article about men's health and wondered if in addition to regular CPX labs if he should have any of the following Testosterone  Estrogen (?) C reactive protein Homocystine  PLAC test  repeat on [facility](?), patient had this last year  Please advise on all test you would like for patient to have prior to his CPX

## 2011-07-26 ENCOUNTER — Other Ambulatory Visit (INDEPENDENT_AMBULATORY_CARE_PROVIDER_SITE_OTHER): Payer: Managed Care, Other (non HMO)

## 2011-07-26 DIAGNOSIS — Z87438 Personal history of other diseases of male genital organs: Secondary | ICD-10-CM

## 2011-07-26 DIAGNOSIS — Z0000 Encounter for general adult medical examination without abnormal findings: Secondary | ICD-10-CM

## 2011-07-26 DIAGNOSIS — Z87898 Personal history of other specified conditions: Secondary | ICD-10-CM

## 2011-07-26 LAB — CBC WITH DIFFERENTIAL/PLATELET
Basophils Relative: 0.9 % (ref 0.0–3.0)
Eosinophils Absolute: 0.2 10*3/uL (ref 0.0–0.7)
MCHC: 34.5 g/dL (ref 30.0–36.0)
MCV: 88.6 fl (ref 78.0–100.0)
Monocytes Absolute: 0.5 10*3/uL (ref 0.1–1.0)
Neutro Abs: 3.1 10*3/uL (ref 1.4–7.7)
Neutrophils Relative %: 55.8 % (ref 43.0–77.0)
RBC: 4.73 Mil/uL (ref 4.22–5.81)

## 2011-07-26 LAB — HEPATIC FUNCTION PANEL
ALT: 37 U/L (ref 0–53)
Albumin: 4.2 g/dL (ref 3.5–5.2)
Total Bilirubin: 0.5 mg/dL (ref 0.3–1.2)
Total Protein: 6.9 g/dL (ref 6.0–8.3)

## 2011-07-26 LAB — PSA: PSA: 1.05 ng/mL (ref 0.10–4.00)

## 2011-07-26 LAB — LIPID PANEL
Cholesterol: 119 mg/dL (ref 0–200)
Total CHOL/HDL Ratio: 4
Triglycerides: 96 mg/dL (ref 0.0–149.0)

## 2011-07-26 LAB — BASIC METABOLIC PANEL
CO2: 28 mEq/L (ref 19–32)
Chloride: 105 mEq/L (ref 96–112)
Creatinine, Ser: 0.9 mg/dL (ref 0.4–1.5)

## 2011-07-28 ENCOUNTER — Other Ambulatory Visit: Payer: Self-pay | Admitting: Internal Medicine

## 2011-07-28 DIAGNOSIS — Z0000 Encounter for general adult medical examination without abnormal findings: Secondary | ICD-10-CM

## 2011-07-30 ENCOUNTER — Encounter: Payer: Managed Care, Other (non HMO) | Admitting: Internal Medicine

## 2011-08-03 ENCOUNTER — Encounter: Payer: Self-pay | Admitting: Internal Medicine

## 2011-08-03 ENCOUNTER — Ambulatory Visit (INDEPENDENT_AMBULATORY_CARE_PROVIDER_SITE_OTHER): Payer: Managed Care, Other (non HMO) | Admitting: Internal Medicine

## 2011-08-03 DIAGNOSIS — Z0000 Encounter for general adult medical examination without abnormal findings: Secondary | ICD-10-CM

## 2011-08-03 DIAGNOSIS — E785 Hyperlipidemia, unspecified: Secondary | ICD-10-CM

## 2011-08-03 DIAGNOSIS — R7309 Other abnormal glucose: Secondary | ICD-10-CM

## 2011-08-03 LAB — HEMOGLOBIN A1C: Hgb A1c MFr Bld: 5.7 % (ref 4.6–6.5)

## 2011-08-03 NOTE — Assessment & Plan Note (Signed)
On Avodart his PSA is 1.05 meaning it should be considered 2.10

## 2011-08-03 NOTE — Progress Notes (Signed)
Subjective:    Patient ID: Jerry Simpson, male    DOB: July 03, 1945, 66 y.o.   MRN: [id number]  HPI  Jerry Simpson is here for a physical;acute issues include nocturia in context of BPH. He also has intermittent frequency during the day. He  Is to be  followed by Jerry Simpson, Urologist      Review of Systems PMH of ELEVATED BP w/o  DIAGNOSIS of HYPERTENSION: Disease Monitoring: Blood pressure range-<135/85  Chest pain, palpitations- no       Dyspnea-intermittently with stairs Medications: Compliance- no meds Lightheadedness,Syncope- no  Edema- no  PMH of FASTING HYPERGLYCEMIA: Polyuria/phagia/dipsia-only as noted      Visual problems- no  HYPERLIPIDEMIA: Disease Monitoring: See symptoms for Hypertension Medications: Compliance- yes  Abd pain, bowel changes- no   Muscle aches- no           Objective:   Physical Exam Gen.: Healthy and well-nourished in appearance. Alert, appropriate and cooperative throughout exam. Head: Normocephalic without obvious abnormalities  Eyes: No corneal or conjunctival inflammation noted. Pupils equal round reactive to light and accommodation. Fundal exam is benign without hemorrhages, exudate, papilledema. Extraocular motion intact. Vision grossly normal. Ears: External  ear exam reveals no significant lesions or deformities. Canals clear .TMs normal. Hearing is grossly normal bilaterally. Nose: External nasal exam reveals no deformity or inflammation. Nasal mucosa are pink and moist. No lesions or exudates noted.  Mouth: Oral mucosa and oropharynx reveal no lesions or exudates. Teeth in good repair. Neck: No deformities, masses, or tenderness noted. Range of motion & Thyroid normal. Lungs: Normal respiratory effort; chest expands symmetrically. Lungs are clear to auscultation without rales, wheezes, or increased work of breathing. Heart: Normal rate and rhythm. Normal S1 and S2. No gallop, click, or rub. Grade 1/2 over 6 systolic murmur  . Abdomen: Bowel sounds normal; abdomen soft and nontender. No masses, organomegaly or hernias noted. Genitalia: Dr Annabell Simpson  .                                                                                   Musculoskeletal/extremities: No deformity or scoliosis noted of  the thoracic or lumbar spine. No clubbing, cyanosis, edema, or deformity noted. Range of motion  normal .Tone & strength  normal.Joints normal. Nail health  good. Vascular: Carotid, radial artery, dorsalis pedis and  posterior tibial pulses are full and equal. No bruits present. Neurologic: Alert and oriented x3. Deep tendon reflexes symmetrical and normal.          Skin: Intact without suspicious lesions or rashes. Lymph: No cervical, axillary lymphadenopathy present. Psych: Mood and affect are normal. Normally interactive                                                                                         Assessment & Plan:  #1  comprehensive physical exam; no acute findings #2 see Problem List with Assessments & Recommendations Plan: see Orders

## 2011-08-03 NOTE — Patient Instructions (Signed)
Preventive Health Care: Exercise at least 30-45 minutes a day,  3-4 days a week.  Eat a low-fat diet with lots of fruits and vegetables, up to 7-9 servings per day. Consume less than 40 grams of sugar per day from foods & drinks with High Fructose Corn Sugar as # 1,2,3 or # 4 on label. Blood Pressure Goal  Ideally is an AVERAGE < 135/85. This AVERAGE should be calculated from @ least 5-7 BP readings taken @ different times of day on different days of week. You should not respond to isolated BP readings , but rather the AVERAGE for that week.  Interventions to raise HDL or GOOD cholesterol include: exercising 30-45 minutes 3-4 X per week; including salmon & tuna in the diet;  & supplementing with Omega 3 fatty acids (Flax or Fish oil )  1-2 grams per day. The B vitamin Niacin also raises HDL but has a vasodilating effect which may cause flushing.

## 2011-08-03 NOTE — Assessment & Plan Note (Signed)
Fasting blood sugar was 120; A1c will be checked

## 2011-08-03 NOTE — Assessment & Plan Note (Signed)
HDL is 28.8; interventions discussed.

## 2011-08-05 ENCOUNTER — Telehealth: Payer: Self-pay | Admitting: Internal Medicine

## 2011-08-05 MED ORDER — PANTOPRAZOLE SODIUM 40 MG PO TBEC: 40.0000 mg | DELAYED_RELEASE_TABLET | Freq: Every day | ORAL | Status: DC

## 2011-08-05 NOTE — Telephone Encounter (Signed)
Patient walked in the office requesting that a refill of protonix be sent to [facility]

## 2011-08-05 NOTE — Telephone Encounter (Signed)
Refill sent to [facility] #[phone] x no refills.

## 2011-08-08 ENCOUNTER — Encounter: Payer: Self-pay | Admitting: Internal Medicine

## 2011-08-19 ENCOUNTER — Other Ambulatory Visit: Payer: Self-pay | Admitting: Internal Medicine

## 2011-08-20 NOTE — Telephone Encounter (Signed)
Prescription sent to pharmacy.

## 2011-08-27 ENCOUNTER — Other Ambulatory Visit: Payer: Self-pay | Admitting: Internal Medicine

## 2011-08-27 NOTE — Telephone Encounter (Signed)
Prescription sent to pharmacy.

## 2011-09-14 ENCOUNTER — Encounter: Payer: Managed Care, Other (non HMO) | Admitting: Internal Medicine

## 2011-10-27 ENCOUNTER — Encounter: Payer: Self-pay | Admitting: Internal Medicine

## 2011-10-27 ENCOUNTER — Ambulatory Visit (AMBULATORY_SURGERY_CENTER): Payer: Managed Care, Other (non HMO) | Admitting: Internal Medicine

## 2011-10-27 VITALS — BP 177/101 | HR 67 | Temp 97.4°F | Resp 16 | Ht 74.0 in | Wt 224.0 lb

## 2011-10-27 DIAGNOSIS — D126 Benign neoplasm of colon, unspecified: Secondary | ICD-10-CM

## 2011-10-27 DIAGNOSIS — K5730 Diverticulosis of large intestine without perforation or abscess without bleeding: Secondary | ICD-10-CM

## 2011-10-27 DIAGNOSIS — Z1211 Encounter for screening for malignant neoplasm of colon: Secondary | ICD-10-CM

## 2011-10-27 DIAGNOSIS — Z86010 Personal history of colonic polyps: Secondary | ICD-10-CM

## 2011-10-27 MED ORDER — SODIUM CHLORIDE 0.9 % IV SOLN: 500.0000 mL | INTRAVENOUS | Status: DC

## 2011-10-27 NOTE — Patient Instructions (Signed)
YOU HAD AN ENDOSCOPIC PROCEDURE TODAY AT THE [facility]: Refer to the procedure report that was given to you for any specific questions about what was found during the examination.  If the procedure report does not answer your questions, please call your gastroenterologist to clarify.  If you requested that your care partner not be given the details of your procedure findings, then the procedure report has been included in a sealed envelope for you to review at your convenience later.  YOU SHOULD EXPECT: Some feelings of bloating in the abdomen. Passage of more gas than usual.  Walking can help get rid of the air that was put into your GI tract during the procedure and reduce the bloating. If you had a lower endoscopy (such as a colonoscopy or flexible sigmoidoscopy) you may notice spotting of blood in your stool or on the toilet paper. If you underwent a bowel prep for your procedure, then you may not have a normal bowel movement for a few days.  DIET: Your first meal following the procedure should be a light meal and then it is ok to progress to your normal diet.  A half-sandwich or bowl of soup is an example of a good first meal.  Heavy or fried foods are harder to digest and may make you feel nauseous or bloated.  Likewise meals heavy in dairy and vegetables can cause extra gas to form and this can also increase the bloating.  Drink plenty of fluids but you should avoid alcoholic beverages for 24 hours.  ACTIVITY: Your care partner should take you home directly after the procedure.  You should plan to take it easy, moving slowly for the rest of the day.  You can resume normal activity the day after the procedure however you should NOT DRIVE or use heavy machinery for 24 hours (because of the sedation medicines used during the test).    SYMPTOMS TO REPORT IMMEDIATELY: A gastroenterologist can be reached at any hour.  During normal business hours, 8:30 AM to 5:00 PM Monday through Friday,  call [phone].  After hours and on weekends, please call the GI answering service at [phone] who will take a message and have the physician on call contact you.   Following lower endoscopy (colonoscopy or flexible sigmoidoscopy):  Excessive amounts of blood in the stool  Significant tenderness or worsening of abdominal pains  Swelling of the abdomen that is new, acute  Fever of 100F or higher    FOLLOW UP: If any biopsies were taken you will be contacted by phone or by letter within the next 1-3 weeks.  Call your gastroenterologist if you have not heard about the biopsies in 3 weeks.  Our staff will call the home number listed on your records the next business day following your procedure to check on you and address any questions or concerns that you may have at that time regarding the information given to you following your procedure. This is a courtesy call and so if there is no answer at the home number and we have not heard from you through the emergency physician on call, we will assume that you have returned to your regular daily activities without incident.  SIGNATURES/CONFIDENTIALITY: You and/or your care partner have signed paperwork which will be entered into your electronic medical record.  These signatures attest to the fact that that the information above on your After Visit Summary has been reviewed and is understood.  Full responsibility of the confidentiality   of this discharge information lies with you and/or your care-partner.     

## 2011-10-27 NOTE — Progress Notes (Signed)
Patient did not experience any of the following events: a burn prior to discharge; a fall within the facility; wrong site/side/patient/procedure/implant event; or a hospital transfer or hospital admission upon discharge from the facility. (G8907) Patient did not have preoperative order for IV antibiotic SSI prophylaxis. (G8918)  

## 2011-10-27 NOTE — Op Note (Signed)
[facility] [address] [place], [place]  [address]  COLONOSCOPY PROCEDURE REPORT  PATIENT:  Jerry Simpson, Jerry Simpson  MR#:  [id number] BIRTHDATE:  [birth year], 65 yrs. old  GENDER:  male ENDOSCOPIST:  Wilhemina Bonito. Eda Keys, MD REF. BY:  Surveillance Program Recall, PROCEDURE DATE:  10/27/2011 PROCEDURE:  Colonoscopy with snare polypectomy x 1 ASA CLASS:  Class II INDICATIONS:  history of polyps, surveillance and high-risk screening ; remote hx adenomatous polyp SML (by report) and negative followup 04-2004 [facility]) with f/u at this time recommended  MEDICATIONS:   MAC sedation, administered by CRNA, propofol (Diprivan) 450 mg IV  DESCRIPTION OF PROCEDURE:   After the risks benefits and alternatives of the procedure were thoroughly explained, informed consent was obtained.  Digital rectal exam was performed and revealed no abnormalities.   The LB [id number]-[id number] endoscope was introduced through the anus and advanced to the cecum, which was identified by both the appendix and ileocecal valve, without limitations.  The quality of the prep was adequate, using MoviPrep.  The instrument was then slowly withdrawn as the colon was fully examined. <<PROCEDUREIMAGES>>  FINDINGS:  Difficult to negotiate sigmoid region. Changed to peds scope.A diminutive polyp was found in the sigmoid colon and snared without cautery. Retrieval was successful.  Moderate diverticulosis was found in the sigmoid colon.  Otherwise normal colonoscopy without other polyps, masses, vascular ectasias, or inflammatory changes.   Retroflexed views in the rectum revealed small  internal hemorrhoids.    The time to cecum = 10  minutes (+ 8 min first scope). The scope was then withdrawn in 14  minutes from the cecum and the procedure completed.  COMPLICATIONS:  None  ENDOSCOPIC IMPRESSION: 1) Diminutive polyp in the sigmoid colon - removed 2) Moderate diverticulosis in the sigmoid colon 3) Otherwise normal  colonoscopy  RECOMMENDATIONS: 1) Repeat colonoscopy in 5 years if polyp adenomatous; otherwise 10 years  ______________________________ Wilhemina Bonito. Eda Keys, MD  CC:  Pecola Lawless, MD;  The Patient  n. eSIGNED:   Wilhemina Bonito. Eda Keys at 10/27/2011 11:57 AM  Donnella Bi, [phone]

## 2011-10-27 NOTE — Progress Notes (Signed)
CHANGED COLONOSCOPE AT 1124, 10:17MIN RECORDED FOR TIME IN PRIOR TO SCOPE CHANGE

## 2011-10-28 ENCOUNTER — Telehealth: Payer: Self-pay | Admitting: *Deleted

## 2011-10-28 NOTE — Telephone Encounter (Signed)
  Follow up Call-  Call back number 10/27/2011  Post procedure Call Back phone  # [phone]  Permission to leave phone message Yes     Patient questions:  Do you have a fever, pain , or abdominal swelling? no Pain Score  0 *  Have you tolerated food without any problems? Yes   Have you been able to return to your normal activities? yes  Do you have any questions about your discharge instructions: Diet   no Medications  no Follow up visit  no  Do you have questions or concerns about your Care? no  Actions: * If pain score is 4 or above: No action needed, pain <4.

## 2011-11-01 ENCOUNTER — Encounter: Payer: Self-pay | Admitting: Internal Medicine

## 2011-11-08 ENCOUNTER — Other Ambulatory Visit: Payer: Self-pay | Admitting: Internal Medicine

## 2012-01-19 ENCOUNTER — Other Ambulatory Visit: Payer: Self-pay | Admitting: Internal Medicine

## 2012-04-10 ENCOUNTER — Ambulatory Visit (INDEPENDENT_AMBULATORY_CARE_PROVIDER_SITE_OTHER): Payer: Managed Care, Other (non HMO) | Admitting: Internal Medicine

## 2012-04-10 DIAGNOSIS — Z23 Encounter for immunization: Secondary | ICD-10-CM

## 2012-06-26 ENCOUNTER — Telehealth: Payer: Self-pay | Admitting: Internal Medicine

## 2012-06-26 ENCOUNTER — Other Ambulatory Visit: Payer: Self-pay | Admitting: Internal Medicine

## 2012-06-26 DIAGNOSIS — E785 Hyperlipidemia, unspecified: Secondary | ICD-10-CM

## 2012-06-26 DIAGNOSIS — R739 Hyperglycemia, unspecified: Secondary | ICD-10-CM

## 2012-06-26 DIAGNOSIS — T887XXA Unspecified adverse effect of drug or medicament, initial encounter: Secondary | ICD-10-CM

## 2012-06-26 DIAGNOSIS — Z0000 Encounter for general adult medical examination without abnormal findings: Secondary | ICD-10-CM

## 2012-06-26 NOTE — Telephone Encounter (Signed)
Called pt on listed 4:23pm 1.6.14-LMOM orders in [place] to go to [place] lab anytime

## 2012-06-26 NOTE — Telephone Encounter (Signed)
Orders placed.

## 2012-06-26 NOTE — Telephone Encounter (Signed)
pt has cpe scheduled for 1.10.14 at 10:30pm -- pt wants to go to [facility] tomorrow to have labs drawn so he can discuss at his visit, please call once entered cb# [phone]

## 2012-06-27 ENCOUNTER — Other Ambulatory Visit: Payer: Self-pay | Admitting: Internal Medicine

## 2012-06-27 ENCOUNTER — Other Ambulatory Visit (INDEPENDENT_AMBULATORY_CARE_PROVIDER_SITE_OTHER): Payer: Managed Care, Other (non HMO)

## 2012-06-27 DIAGNOSIS — R739 Hyperglycemia, unspecified: Secondary | ICD-10-CM

## 2012-06-27 DIAGNOSIS — R972 Elevated prostate specific antigen [PSA]: Secondary | ICD-10-CM

## 2012-06-27 DIAGNOSIS — R7309 Other abnormal glucose: Secondary | ICD-10-CM

## 2012-06-27 DIAGNOSIS — Z0000 Encounter for general adult medical examination without abnormal findings: Secondary | ICD-10-CM

## 2012-06-27 DIAGNOSIS — E785 Hyperlipidemia, unspecified: Secondary | ICD-10-CM

## 2012-06-27 DIAGNOSIS — T887XXA Unspecified adverse effect of drug or medicament, initial encounter: Secondary | ICD-10-CM

## 2012-06-27 LAB — BASIC METABOLIC PANEL
CO2: 30 mEq/L (ref 19–32)
Calcium: 9.3 mg/dL (ref 8.4–10.5)
Chloride: 103 mEq/L (ref 96–112)
Glucose, Bld: 108 mg/dL — ABNORMAL HIGH (ref 70–99)
Sodium: 137 mEq/L (ref 135–145)

## 2012-06-27 LAB — CBC WITH DIFFERENTIAL/PLATELET
Basophils Absolute: 0.1 10*3/uL (ref 0.0–0.1)
Basophils Relative: 1.2 % (ref 0.0–3.0)
Eosinophils Absolute: 0.1 10*3/uL (ref 0.0–0.7)
Hemoglobin: 14.5 g/dL (ref 13.0–17.0)
Lymphocytes Relative: 34.1 % (ref 12.0–46.0)
Lymphs Abs: 1.8 10*3/uL (ref 0.7–4.0)
MCHC: 34.2 g/dL (ref 30.0–36.0)
MCV: 86.6 fl (ref 78.0–100.0)
Monocytes Absolute: 0.4 10*3/uL (ref 0.1–1.0)
Neutro Abs: 2.9 10*3/uL (ref 1.4–7.7)
RBC: 4.9 Mil/uL (ref 4.22–5.81)
RDW: 13.1 % (ref 11.5–14.6)

## 2012-06-27 LAB — LIPID PANEL
Cholesterol: 202 mg/dL — ABNORMAL HIGH (ref 0–200)
Total CHOL/HDL Ratio: 7
VLDL: 41.8 mg/dL — ABNORMAL HIGH (ref 0.0–40.0)

## 2012-06-27 LAB — HEPATIC FUNCTION PANEL
AST: 23 U/L (ref 0–37)
Albumin: 4.2 g/dL (ref 3.5–5.2)
Alkaline Phosphatase: 44 U/L (ref 39–117)
Total Bilirubin: 0.9 mg/dL (ref 0.3–1.2)

## 2012-06-27 LAB — LDL CHOLESTEROL, DIRECT: Direct LDL: 122.1 mg/dL

## 2012-06-27 LAB — PSA, TOTAL AND FREE: PSA, Free Pct: 16 % — ABNORMAL LOW (ref >25)

## 2012-06-30 ENCOUNTER — Encounter: Payer: Self-pay | Admitting: Internal Medicine

## 2012-06-30 ENCOUNTER — Ambulatory Visit (INDEPENDENT_AMBULATORY_CARE_PROVIDER_SITE_OTHER): Payer: Managed Care, Other (non HMO) | Admitting: Internal Medicine

## 2012-06-30 VITALS — BP 140/82 | HR 79 | Temp 97.9°F | Resp 12

## 2012-06-30 DIAGNOSIS — Z23 Encounter for immunization: Secondary | ICD-10-CM

## 2012-06-30 DIAGNOSIS — Z0000 Encounter for general adult medical examination without abnormal findings: Secondary | ICD-10-CM

## 2012-06-30 DIAGNOSIS — T887XXA Unspecified adverse effect of drug or medicament, initial encounter: Secondary | ICD-10-CM

## 2012-06-30 DIAGNOSIS — E785 Hyperlipidemia, unspecified: Secondary | ICD-10-CM

## 2012-06-30 DIAGNOSIS — I2510 Atherosclerotic heart disease of native coronary artery without angina pectoris: Secondary | ICD-10-CM

## 2012-06-30 NOTE — Patient Instructions (Addendum)
To prevent palpitations or premature beats, avoid stimulants such as decongestants, diet pills, nicotine, or caffeine (coffee, tea, cola, or chocolate) to excess.  EKG is normal but there are MINOR ST-T changes of early repolarization. These are normal variants but could be mistaken for acute injury. This EKG should be available for comparison if  seen emergently.  The most common cause of elevated triglycerides is the ingestion of sugar from high fructose corn syrup sources added to processed foods & drinks.  Eat a low-fat diet with lots of fruits and vegetables, up to 7-9 servings per day. Consume less than 40 (ZERO preferably) grams of sugar per day from foods & drinks with High Fructose Corn Syrup (HFCS) sugar as #1,2,3 or # 4 on label.Whole [employer], [employer] & [employer] do not carry products with HFCS. Please  schedule fasting Labs after 10 weeks of Crestor 20 mg : CK,Lipids, hepatic panel. PLEASE BRING THESE INSTRUCTIONS TO FOLLOW UP  LAB APPOINTMENT.This will guarantee correct labs are drawn, eliminating need for repeat blood sampling ( needle sticks ! ). Diagnoses /Codes:272.4,995.20. Review and correct the record as indicated. Please share record with all medical staff seen.   If you activate My Chart; the results can be released to you as soon as they populate from the lab. If you choose not to use this program; the labs have to be reviewed, copied & mailed   causing a delay in getting the results to you.

## 2012-06-30 NOTE — Addendum Note (Signed)
Addended by: Maurice Small on: 06/30/2012 12:02 PM   Modules accepted: Orders

## 2012-06-30 NOTE — Progress Notes (Signed)
  Subjective:    Patient ID: Jerry Simpson, male    DOB: [birth year], 67 y.o.   MRN: [id number]  HPI  Mr Emelia Salisbury is here for a physical; he denies acute issues.      Review of Systems He is on a heart healthy diet; he is not  exercising . He denies chest pain, palpitations, dyspnea, or claudication. Family history is positive for premature coronary disease in a brother. Advanced cholesterol testing & PMH of stent  documents his LDL goal is less than 70.      Objective:   Physical Exam Gen.:  well-nourished in appearance. Alert, appropriate and cooperative throughout exam. Head: Normocephalic without obvious abnormalities  Eyes: No corneal or conjunctival inflammation noted. Pupils equal round reactive to light and accommodation. Fundal exam is benign without hemorrhages, exudate, papilledema. Extraocular motion intact. Vision grossly normal. Ears: External  ear exam reveals no significant lesions or deformities. Canals clear .TMs normal. Hearing is grossly normal bilaterally. Nose: External nasal exam reveals no deformity or inflammation. Nasal mucosa are pink and moist. No lesions or exudates noted. Septal deviation & dislocation  Mouth: Oral mucosa and oropharynx reveal no lesions or exudates. Teeth in good repair. Neck: No deformities, masses, or tenderness noted. Range of motion & Thyroid normal. Lungs: Normal respiratory effort; chest expands symmetrically. Lungs are clear to auscultation without rales, wheezes, or increased work of breathing. Heart: Normal rate and rhythm. Normal S1 and S2. No gallop, click, or rub. S4 w/o murmur. Abdomen: Bowel sounds normal; abdomen soft and nontender. No masses, organomegaly or hernias noted. Genitalia: Dr Annabell Howells                                                        Musculoskeletal/extremities: No deformity or scoliosis noted of  the thoracic or lumbar spine. No clubbing, cyanosis, edema, or deformity noted. Range of motion  normal .Tone & strength   normal.Joints normal. Nail health  good. Vascular: Carotid, radial artery, dorsalis pedis and  posterior tibial pulses are full and equal. No bruits present. Neurologic: Alert and oriented x3. Deep tendon reflexes symmetrical and normal.          Skin: Intact without suspicious lesions or rashes. Lymph: No cervical, axillary lymphadenopathy present. Psych: Mood and affect are normal. Normally interactive                                                                                        Assessment & Plan:  #1 comprehensive physical exam; no acute findings  Plan: see Orders

## 2012-09-07 ENCOUNTER — Other Ambulatory Visit (INDEPENDENT_AMBULATORY_CARE_PROVIDER_SITE_OTHER): Payer: Managed Care, Other (non HMO)

## 2012-09-07 DIAGNOSIS — E785 Hyperlipidemia, unspecified: Secondary | ICD-10-CM

## 2012-09-07 DIAGNOSIS — T887XXA Unspecified adverse effect of drug or medicament, initial encounter: Secondary | ICD-10-CM

## 2012-09-07 LAB — HEPATIC FUNCTION PANEL
ALT: 31 U/L (ref 0–53)
AST: 23 U/L (ref 0–37)
Albumin: 4.3 g/dL (ref 3.5–5.2)
Total Bilirubin: 0.9 mg/dL (ref 0.3–1.2)
Total Protein: 7.4 g/dL (ref 6.0–8.3)

## 2012-09-07 LAB — LIPID PANEL
Cholesterol: 119 mg/dL (ref 0–200)
HDL: 25.5 mg/dL — ABNORMAL LOW (ref >39.00)
Triglycerides: 155 mg/dL — ABNORMAL HIGH (ref 0.0–149.0)
VLDL: 31 mg/dL (ref 0.0–40.0)

## 2012-09-07 LAB — CK: Total CK: 56 U/L (ref 7–232)

## 2012-09-14 ENCOUNTER — Other Ambulatory Visit: Payer: Self-pay | Admitting: Internal Medicine

## 2012-09-22 ENCOUNTER — Other Ambulatory Visit: Payer: Self-pay | Admitting: Internal Medicine

## 2012-11-21 ENCOUNTER — Other Ambulatory Visit: Payer: Self-pay | Admitting: Internal Medicine

## 2013-01-24 ENCOUNTER — Other Ambulatory Visit: Payer: Self-pay

## 2013-03-06 ENCOUNTER — Ambulatory Visit (INDEPENDENT_AMBULATORY_CARE_PROVIDER_SITE_OTHER): Payer: Managed Care, Other (non HMO) | Admitting: Internal Medicine

## 2013-03-06 ENCOUNTER — Other Ambulatory Visit: Payer: Self-pay | Admitting: Internal Medicine

## 2013-03-06 ENCOUNTER — Encounter: Payer: Self-pay | Admitting: Internal Medicine

## 2013-03-06 VITALS — BP 141/88 | HR 91 | Temp 98.3°F | Wt 219.4 lb

## 2013-03-06 DIAGNOSIS — R05 Cough: Secondary | ICD-10-CM

## 2013-03-06 DIAGNOSIS — R059 Cough, unspecified: Secondary | ICD-10-CM

## 2013-03-06 DIAGNOSIS — J309 Allergic rhinitis, unspecified: Secondary | ICD-10-CM

## 2013-03-06 MED ORDER — HYDROCODONE-HOMATROPINE 5-1.5 MG/5ML PO SYRP: 5.0000 mL | ORAL_SOLUTION | Freq: Four times a day (QID) | ORAL | Status: DC | PRN

## 2013-03-06 MED ORDER — ROSUVASTATIN CALCIUM 20 MG PO TABS: ORAL_TABLET | ORAL | Status: DC

## 2013-03-06 MED ORDER — FLUTICASONE-SALMETEROL 100-50 MCG/DOSE IN AEPB: 1.0000 | INHALATION_SPRAY | Freq: Two times a day (BID) | RESPIRATORY_TRACT | Status: DC

## 2013-03-06 MED ORDER — MONTELUKAST SODIUM 10 MG PO TABS: 10.0000 mg | ORAL_TABLET | Freq: Every day | ORAL | Status: DC

## 2013-03-06 MED ORDER — PREDNISONE 20 MG PO TABS: 20.0000 mg | ORAL_TABLET | Freq: Two times a day (BID) | ORAL | Status: DC

## 2013-03-06 MED ORDER — FLUTICASONE PROPIONATE 50 MCG/ACT NA SUSP: 1.0000 | Freq: Two times a day (BID) | NASAL | Status: DC | PRN

## 2013-03-06 NOTE — Patient Instructions (Addendum)
Plain Mucinex (NOT D) for thick secretions ;force NON dairy fluids .   Nasal cleansing in the shower as discussed with lather of mild shampoo.After 10 seconds wash off lather while  exhaling through nostrils. Make sure that all residual soap is removed to prevent irritation.  Fluticasone 1 spray in each nostril twice a day as needed. Use the "crossover" technique into opposite nostril spraying toward opposite ear @ 45 degree angle, not straight up into nostril.  Use a Neti pot daily only  as needed for significant sinus congestion; going from open side to congested side . Plain Allegra (NOT D )  160 daily , Loratidine 10 mg , OR Zyrtec 10 mg @ bedtime  as needed for itchy eyes & sneezing. Advair one inhalations every 12 hours; gargle and spit after use Start with prednisone 20 mg twice a day with meals. The dose can be decreased by one half every 72 hours if symptoms are quiescent.

## 2013-03-06 NOTE — Progress Notes (Signed)
  Subjective:    Patient ID: Jerry Simpson, male    DOB: August 27, 1945, 67 y.o.   MRN: [id number]  HPI   Symptoms started in early September as a paroxysmal nonproductive cough which has progressed. There was no specific trigger or exposure prior to onset of symptoms  He does have occasional postnasal drainage. Nasacort and Allegra were of essentially no benefit.  He questions whether he may have had some wheezing. In 1999 there was some question of reactive airways disease.  He is not on ACE inhibitor.    Review of Systems  He denies associated extrinsic symptoms of itchy, watery eyes, sneezing. He has not had fever, chills, or sweats. He also has no dyspnea.  He denies any significant reflux symptoms.     Objective:   Physical Exam  General appearance:good health ;well nourished; no acute distress or increased work of breathing is present.  No  lymphadenopathy about the head, neck, or axilla noted.   Eyes: No conjunctival inflammation or lid edema is present.   Ears:  External ear exam shows no significant lesions or deformities.  Otoscopic examination reveals clear canals, tympanic membranes are intact bilaterally without bulging, retraction, inflammation or discharge.  Nose:  External nasal examination shows no deformity or inflammation. Nasal mucosa are pink and moist without lesions or exudates. No septal dislocation or deviation.No obstruction to airflow.   Oral exam: Dental hygiene is good; lips and gums are healthy appearing.There is no oropharyngeal erythema or exudate noted.   Neck:  No deformities,  masses, or tenderness noted.    Heart:  Normal rate and regular rhythm. S1 and S2 normal without gallop, murmur, click, rub or other extra sounds.   Lungs:Chest clear to auscultation; no wheezes, rhonchi,rales ,or rubs present.No increased work of breathing.  Dry cough  Extremities:  No cyanosis, edema, or clubbing  noted    Skin: Warm & dry         Assessment &  Plan:  #1cough # 2PNDrainage See Plan

## 2013-04-19 ENCOUNTER — Telehealth: Payer: Self-pay | Admitting: Internal Medicine

## 2013-04-19 DIAGNOSIS — E785 Hyperlipidemia, unspecified: Secondary | ICD-10-CM

## 2013-04-19 DIAGNOSIS — R7309 Other abnormal glucose: Secondary | ICD-10-CM

## 2013-04-19 DIAGNOSIS — T887XXA Unspecified adverse effect of drug or medicament, initial encounter: Secondary | ICD-10-CM

## 2013-04-19 NOTE — Telephone Encounter (Signed)
Patient called and was upset about not being able to get his lab work done before his cpe apt. Patient insists on talking to dr hopper about this. Thanks

## 2013-04-26 ENCOUNTER — Other Ambulatory Visit: Payer: Self-pay

## 2013-05-03 NOTE — Telephone Encounter (Signed)
Future labs ordered and sent.//AB/CMA 

## 2013-05-07 ENCOUNTER — Other Ambulatory Visit: Payer: Self-pay | Admitting: *Deleted

## 2013-05-07 MED ORDER — PANTOPRAZOLE SODIUM 40 MG PO TBEC: DELAYED_RELEASE_TABLET | ORAL | Status: DC

## 2013-05-07 NOTE — Telephone Encounter (Signed)
pantoprazole refilled per protocol

## 2013-05-08 ENCOUNTER — Ambulatory Visit (INDEPENDENT_AMBULATORY_CARE_PROVIDER_SITE_OTHER): Payer: Medicare Other | Admitting: Internal Medicine

## 2013-05-08 DIAGNOSIS — Z23 Encounter for immunization: Secondary | ICD-10-CM

## 2013-06-27 ENCOUNTER — Other Ambulatory Visit (INDEPENDENT_AMBULATORY_CARE_PROVIDER_SITE_OTHER): Payer: Medicare Other

## 2013-06-27 DIAGNOSIS — E785 Hyperlipidemia, unspecified: Secondary | ICD-10-CM

## 2013-06-27 DIAGNOSIS — R7309 Other abnormal glucose: Secondary | ICD-10-CM

## 2013-06-27 DIAGNOSIS — T887XXA Unspecified adverse effect of drug or medicament, initial encounter: Secondary | ICD-10-CM

## 2013-06-27 LAB — BASIC METABOLIC PANEL
BUN: 16 mg/dL (ref 6–23)
CHLORIDE: 104 meq/L (ref 96–112)
CO2: 30 mEq/L (ref 19–32)
CREATININE: 1 mg/dL (ref 0.4–1.5)
Calcium: 9 mg/dL (ref 8.4–10.5)
GFR: 80.5 mL/min (ref >60.00)
Glucose, Bld: 120 mg/dL — ABNORMAL HIGH (ref 70–99)
Potassium: 4.1 mEq/L (ref 3.5–5.1)
SODIUM: 139 meq/L (ref 135–145)

## 2013-06-27 LAB — HEPATIC FUNCTION PANEL
ALT: 20 U/L (ref 0–53)
AST: 20 U/L (ref 0–37)
Albumin: 4.3 g/dL (ref 3.5–5.2)
Alkaline Phosphatase: 47 U/L (ref 39–117)
BILIRUBIN TOTAL: 0.4 mg/dL (ref 0.3–1.2)
Bilirubin, Direct: 0.1 mg/dL (ref 0.0–0.3)
Total Protein: 7.3 g/dL (ref 6.0–8.3)

## 2013-06-27 LAB — LIPID PANEL
CHOL/HDL RATIO: 5
Cholesterol: 135 mg/dL (ref 0–200)
HDL: 27.5 mg/dL — AB (ref >39.00)
LDL CALC: 76 mg/dL (ref 0–99)
TRIGLYCERIDES: 160 mg/dL — AB (ref 0.0–149.0)
VLDL: 32 mg/dL (ref 0.0–40.0)

## 2013-06-27 LAB — CK: CK TOTAL: 51 U/L (ref 7–232)

## 2013-06-29 ENCOUNTER — Ambulatory Visit (INDEPENDENT_AMBULATORY_CARE_PROVIDER_SITE_OTHER): Payer: Medicare Other | Admitting: Internal Medicine

## 2013-06-29 ENCOUNTER — Encounter: Payer: Self-pay | Admitting: Internal Medicine

## 2013-06-29 VITALS — BP 144/84 | HR 100 | Temp 98.0°F | Wt 218.0 lb

## 2013-06-29 DIAGNOSIS — R059 Cough, unspecified: Secondary | ICD-10-CM

## 2013-06-29 DIAGNOSIS — R39198 Other difficulties with micturition: Secondary | ICD-10-CM

## 2013-06-29 DIAGNOSIS — B349 Viral infection, unspecified: Secondary | ICD-10-CM

## 2013-06-29 DIAGNOSIS — R05 Cough: Secondary | ICD-10-CM

## 2013-06-29 DIAGNOSIS — B9789 Other viral agents as the cause of diseases classified elsewhere: Secondary | ICD-10-CM

## 2013-06-29 DIAGNOSIS — R3989 Other symptoms and signs involving the genitourinary system: Secondary | ICD-10-CM

## 2013-06-29 DIAGNOSIS — N400 Enlarged prostate without lower urinary tract symptoms: Secondary | ICD-10-CM

## 2013-06-29 LAB — POCT URINALYSIS DIPSTICK
Bilirubin, UA: NEGATIVE
Blood, UA: NEGATIVE
GLUCOSE UA: NEGATIVE
Ketones, UA: NEGATIVE
Leukocytes, UA: NEGATIVE
NITRITE UA: NEGATIVE
Spec Grav, UA: 1.01
Urobilinogen, UA: NEGATIVE
pH, UA: 5

## 2013-06-29 MED ORDER — TAMSULOSIN HCL 0.4 MG PO CAPS: 0.4000 mg | ORAL_CAPSULE | Freq: Every day | ORAL | Status: DC

## 2013-06-29 MED ORDER — OSELTAMIVIR PHOSPHATE 75 MG PO CAPS: 75.0000 mg | ORAL_CAPSULE | Freq: Two times a day (BID) | ORAL | Status: DC

## 2013-06-29 MED ORDER — HYDROCODONE-HOMATROPINE 5-1.5 MG/5ML PO SYRP: 5.0000 mL | ORAL_SOLUTION | Freq: Four times a day (QID) | ORAL | Status: DC | PRN

## 2013-06-29 MED ORDER — FLUTICASONE PROPIONATE 50 MCG/ACT NA SUSP: 1.0000 | Freq: Two times a day (BID) | NASAL | Status: DC | PRN

## 2013-06-29 NOTE — Progress Notes (Signed)
   Subjective:    Patient ID: Jerry Simpson, male    DOB: Jan 07, 1946, 68 y.o.   MRN: [id number]  HPI Acute visit Symptoms started yesterday morning with sore throat, by noon he developed all the other symptoms: The dry cough, postnasal dripping, sore throat, chills. He took some Sudafed and Robitussin.  Last night he also developed urinary frequency and difficulty urinating, he had several unsuccessful trips to urinate it is only today that he was able to urinate better, he still has some urinary frequency. Sx happening in the setting of using sudafed   Past Medical History  Diagnosis Date  . Hyperlipidemia   . Environmental allergies   . CAD (coronary artery disease)   . GERD (gastroesophageal reflux disease)   . Allergy   . BPH (benign prostatic hyperplasia)     elevated PSA, Dr Jeffie Pollock   Past Surgical History  Procedure Laterality Date  . Angioplasty  2004    stent placement ;sees Dr Ron Parker  . Tonsillectomy  1962  . Cataract extraction, bilateral  2012  . Colonoscopy with polypectomy      X2, Dr Henrene Pastor   History  Substance Use Topics  . Smoking status: Never Smoker   . Smokeless tobacco: Never Used  . Alcohol Use: 3.5 oz/week    7 drink(s) per week     Comment:  < 7/ week     Review of Systems Temperature has been as high as 99. No  nausea, vomiting, diarrhea. No myalgia or sick contacts. At some point few months ago was diagnosed with borderline asthma but currently is taking no medications for asthma and he is not wheezing. Denies dysuria or gross hematuria    Objective:   Physical Exam  BP 144/84  Pulse 100  Temp(Src) 98 F (36.7 C)  Wt 218 lb (98.884 kg)  SpO2 95% General -- alert, well-developed, NAD.   HEENT-- Not pale. TMs slt bulge B, no red;  throat symmetric, no redness or discharge. Face symmetric, sinuses not tender to palpation. Nose congested.  Lungs -- normal respiratory effort, no intercostal retractions, no accessory muscle use, and normal  breath sounds.  Heart-- normal rate, regular rhythm, no murmur.  Abdomen-- Not distended, good bowel sounds,soft, non-tender.   Neurologic--  alert & oriented X3. Speech normal, gait normal, strength normal in all extremities.   Psych-- Cognition and judgment appear intact. Cooperative with normal attention span and concentration. No anxious or depressed appearing.      Assessment & Plan:  Viral syndrome, Symptoms started quickly yesterday morning, he has a viral syndrome possibly influenza. He did have a flu shot so that may have diminish his symptoms. Plan: Tamiflu, see instructions , RF hydrocodone  BPH, Has been asymptomatic for long time until last night, this is happening in the setting of the patient taking Sudafed. Plan:  Avoid Sudafed, Flomax temporarily, Udip essentially negative, will send a urine culture. Patient to call if symptoms increase. (addendum , has myberteq at home)

## 2013-06-29 NOTE — Patient Instructions (Signed)
Rest, fluids , tylenol For cough, take Mucinex DM twice a day as needed  For congestion use OTC Nasocort: 2 nasal sprays on each side of the nose daily until you feel better Take tamiflu x 5 days  Call if no better in few days Call anytime if the symptoms are severe   Take Flomax at bedtime every day for 2 weeks then as needed, avoid Sudafed, if your BPH symptoms increase let us know

## 2013-06-29 NOTE — Progress Notes (Signed)
Pre visit review using our clinic review tool, if applicable. No additional management support is needed unless otherwise documented below in the visit note. 

## 2013-07-01 ENCOUNTER — Encounter: Payer: Self-pay | Admitting: Internal Medicine

## 2013-07-01 LAB — URINE CULTURE
Colony Count: NO GROWTH
Organism ID, Bacteria: NO GROWTH

## 2013-07-03 ENCOUNTER — Telehealth: Payer: Self-pay

## 2013-07-03 NOTE — Telephone Encounter (Signed)
Medication List and allergies:  Updated and Reviewed  90 day supply/mail order: n/a Local prescriptions: [place], [place]  Immunization due: Pneumonia Vaccine  A/P: No changes to personal, family or [place] Flu- 05/08/13 Tdap- 06/30/12 PNA- Due Shingles- 03/30/11 CCS-10/27/11 PSA-06/27/12-4.56   To discuss with provider: Wants to change Crestor to Generic Lipitor New improved Pneumonia Vaccine?

## 2013-07-04 ENCOUNTER — Ambulatory Visit (INDEPENDENT_AMBULATORY_CARE_PROVIDER_SITE_OTHER): Payer: Medicare Other | Admitting: Internal Medicine

## 2013-07-04 ENCOUNTER — Encounter: Payer: Self-pay | Admitting: Internal Medicine

## 2013-07-04 VITALS — BP 132/71 | HR 89 | Temp 98.5°F | Resp 12 | Ht 75.0 in | Wt 218.0 lb

## 2013-07-04 DIAGNOSIS — Z0000 Encounter for general adult medical examination without abnormal findings: Secondary | ICD-10-CM

## 2013-07-04 DIAGNOSIS — Z23 Encounter for immunization: Secondary | ICD-10-CM

## 2013-07-04 DIAGNOSIS — Z52008 Unspecified donor, other blood: Secondary | ICD-10-CM | POA: Insufficient documentation

## 2013-07-04 DIAGNOSIS — E785 Hyperlipidemia, unspecified: Secondary | ICD-10-CM

## 2013-07-04 DIAGNOSIS — I2510 Atherosclerotic heart disease of native coronary artery without angina pectoris: Secondary | ICD-10-CM

## 2013-07-04 DIAGNOSIS — R030 Elevated blood-pressure reading, without diagnosis of hypertension: Secondary | ICD-10-CM

## 2013-07-04 MED ORDER — ATORVASTATIN CALCIUM 20 MG PO TABS: 20.0000 mg | ORAL_TABLET | Freq: Every day | ORAL | Status: DC

## 2013-07-04 NOTE — Progress Notes (Signed)
   Subjective:    Patient ID: Jerry Simpson, male    DOB: 08-13-1945, 68 y.o.   MRN: [id number]  HPI    Review of Systems     Objective:   Physical Exam        Assessment & Plan:

## 2013-07-04 NOTE — Progress Notes (Signed)
Subjective:    Patient ID: Jerry Simpson, male    DOB: 07-13-1945, 68 y.o.   MRN: [id number]  HPI Medicare Wellness Visit: Psychosocial and medical history were reviewed as required by Medicare (history related to abuse, antisocial behavior , firearm risk). Social history: Caffeine:1 cup / day   , Alcohol: < 7 / week , Tobacco OQH:UTMLY Exercise:see below Personal safety/fall risk:no Limitations of activities of daily living:no Seatbelt/ smoke alarm use:yes Healthcare Power of Attorney/Living Will status: in place Ophthalmologic exam status:UTD Hearing evaluation status:not current Orientation: Oriented X 3 Memory and recall: good Spelling  testing: good Depression/anxiety assessment: no Foreign travel [place] Immunization status for influenza/pneumonia/ shingles /tetanus:PNA due Transfusion history:no but is a blood donor Preventive health care maintenance status: Colonoscopy as per protocol/standard care:UTD Dental care:every 6 mos Chart reviewed and updated. Active issues reviewed and addressed as documented below.    Review of Systems A modified heart healthy diet is followed; no regular exercise . Family history is + for premature coronary disease. LDL goal is less than 70 due to CAD . There is medication compliance with the statin.  Low dose ASA taken Specifically denied are  chest pain, palpitations, dyspnea, or claudication.  Significant abdominal symptoms, memory deficit, or myalgias not present.     Objective:   Physical Exam  Gen.: Healthy and well-nourished in appearance. Alert, appropriate and cooperative throughout exam.Appears younger than stated age  Head: Normocephalic without obvious abnormalities  Eyes: No corneal or conjunctival inflammation noted. Pupils equal round reactive to light and accommodation. Extraocular motion intact.  Ears: External  ear exam reveals no significant lesions or deformities. Canals clear .TMs normal. Hearing is  grossly normal bilaterally. Nose: External nasal exam reveals no deformity or inflammation. Nasal mucosa are pink and moist. No lesions or exudates noted.   Mouth: Oral mucosa and oropharynx reveal no lesions or exudates. Teeth in good repair. Neck: No deformities, masses, or tenderness noted. Range of motion & Thyroid good. Lungs: Normal respiratory effort; chest expands symmetrically. Lungs are clear to auscultation without rales, wheezes, or increased work of breathing. Heart: Normal rate and rhythm. Normal S1 and S2. No gallop, click, or rub. No murmur. Abdomen: Bowel sounds normal; abdomen soft and nontender. No masses, organomegaly or hernias noted. Genitalia:  as per Dr Jeffie Pollock                                 Musculoskeletal/extremities:  There is some asymmetry of the posterior thoracic musculature suggesting occult scoliosis. No clubbing, cyanosis, edema, or significant extremity  deformity noted. Range of motion normal .Tone & strength normal. Hand joints normal . Fingernail health good. Able to lie down & sit up w/o help. Negative SLR bilaterally Vascular: Carotid, radial artery, dorsalis pedis and  posterior tibial pulses are full and equal. No bruits present. Neurologic: Alert and oriented x3. Deep tendon reflexes symmetrical and normal.  Skin: Intact without suspicious lesions or rashes. Lymph: No cervical, axillary lymphadenopathy present. Psych: Mood and affect are normal. Normally interactive  Assessment & Plan:  #1 Medicare Wellness Exam; criteria met ; data entered #2 Problem List/Diagnoses reviewed Plan:  Assessments made/ Orders entered  

## 2013-07-04 NOTE — Patient Instructions (Signed)
Please  schedule fasting Labs in 12 weeks after statin change: CK,Lipids, hepatic panel.  Cardiovascular exercise, this can be as simple a program as walking, is recommended 30-45 minutes 3-4 times per week. If you're not exercising you should take 6-8 weeks to build up to this level.

## 2013-07-04 NOTE — Progress Notes (Signed)
Pre visit review using our clinic review tool, if applicable. No additional management support is needed unless otherwise documented below in the visit note. 

## 2013-09-26 ENCOUNTER — Other Ambulatory Visit (INDEPENDENT_AMBULATORY_CARE_PROVIDER_SITE_OTHER): Payer: Medicare Other

## 2013-09-26 ENCOUNTER — Telehealth: Payer: Self-pay

## 2013-09-26 DIAGNOSIS — E785 Hyperlipidemia, unspecified: Secondary | ICD-10-CM

## 2013-09-26 LAB — HEPATIC FUNCTION PANEL
ALT: 30 U/L (ref 0–53)
AST: 25 U/L (ref 0–37)
Albumin: 4.2 g/dL (ref 3.5–5.2)
Alkaline Phosphatase: 54 U/L (ref 39–117)
BILIRUBIN DIRECT: 0.1 mg/dL (ref 0.0–0.3)
BILIRUBIN TOTAL: 0.9 mg/dL (ref 0.3–1.2)
Total Protein: 7.6 g/dL (ref 6.0–8.3)

## 2013-09-26 LAB — CK: Total CK: 62 U/L (ref 7–232)

## 2013-09-26 LAB — LIPID PANEL
CHOL/HDL RATIO: 5
Cholesterol: 137 mg/dL (ref 0–200)
HDL: 29.4 mg/dL — ABNORMAL LOW (ref >39.00)
LDL Cholesterol: 67 mg/dL (ref 0–99)
TRIGLYCERIDES: 203 mg/dL — AB (ref 0.0–149.0)
VLDL: 40.6 mg/dL — ABNORMAL HIGH (ref 0.0–40.0)

## 2013-09-26 NOTE — Telephone Encounter (Signed)
Patient called scheduler requesting an appointment.  When no appointments were available, he asked to speak to a nurse.  Patient c/o "gross hematuria" that has slowly cleared, increased urination, elevated blood pressure (338-329 systolic and 19-16 diastolic), lightheadedness, and nausea and projectile vomiting.  Symptoms started after extraneous activity of lifting (40) 50 lb bags of mulch and taking 1/2 Allegra, cough syrup with codeine, and Rapaflo. He only started to feel better after vomiting this afternoon.  He wanted to know what he should do next.  He was advised to seek medical attention at Urgent Care, to increase fluid intake to ensure adequate hydration, to notify Dr. Linna Darner to make him aware of his symptoms and to notify Urology regarding the hematuria and increase urination.  Patient stated understanding and agreed.  No further question or concerns voiced.

## 2013-09-29 ENCOUNTER — Encounter: Payer: Self-pay | Admitting: Internal Medicine

## 2013-10-04 ENCOUNTER — Other Ambulatory Visit: Payer: Self-pay

## 2013-10-04 DIAGNOSIS — E785 Hyperlipidemia, unspecified: Secondary | ICD-10-CM

## 2013-10-04 MED ORDER — PANTOPRAZOLE SODIUM 40 MG PO TBEC: DELAYED_RELEASE_TABLET | ORAL | Status: DC

## 2013-10-04 MED ORDER — ATORVASTATIN CALCIUM 20 MG PO TABS: 20.0000 mg | ORAL_TABLET | Freq: Every day | ORAL | Status: DC

## 2014-04-01 ENCOUNTER — Ambulatory Visit: Payer: Medicare Other

## 2014-04-18 ENCOUNTER — Ambulatory Visit (INDEPENDENT_AMBULATORY_CARE_PROVIDER_SITE_OTHER): Payer: Medicare Other

## 2014-04-18 DIAGNOSIS — Z23 Encounter for immunization: Secondary | ICD-10-CM

## 2014-04-22 ENCOUNTER — Other Ambulatory Visit: Payer: Self-pay | Admitting: General Practice

## 2014-04-22 DIAGNOSIS — E785 Hyperlipidemia, unspecified: Secondary | ICD-10-CM

## 2014-04-22 MED ORDER — ATORVASTATIN CALCIUM 20 MG PO TABS: 20.0000 mg | ORAL_TABLET | Freq: Every day | ORAL | Status: DC

## 2014-04-22 NOTE — Telephone Encounter (Signed)
Med filled.  

## 2014-04-26 ENCOUNTER — Other Ambulatory Visit: Payer: Self-pay | Admitting: General Practice

## 2014-04-26 MED ORDER — PANTOPRAZOLE SODIUM 40 MG PO TBEC: DELAYED_RELEASE_TABLET | ORAL | Status: DC

## 2014-05-13 ENCOUNTER — Encounter: Payer: Self-pay | Admitting: Family Medicine

## 2014-05-13 ENCOUNTER — Ambulatory Visit (INDEPENDENT_AMBULATORY_CARE_PROVIDER_SITE_OTHER): Payer: Medicare Other | Admitting: Family Medicine

## 2014-05-13 VITALS — BP 130/82 | HR 79 | Temp 97.7°F | Resp 18 | Wt 224.5 lb

## 2014-05-13 DIAGNOSIS — J989 Respiratory disorder, unspecified: Secondary | ICD-10-CM

## 2014-05-13 DIAGNOSIS — R0989 Other specified symptoms and signs involving the circulatory and respiratory systems: Secondary | ICD-10-CM

## 2014-05-13 MED ORDER — ALBUTEROL SULFATE (2.5 MG/3ML) 0.083% IN NEBU
2.5000 mg | INHALATION_SOLUTION | Freq: Once | RESPIRATORY_TRACT | Status: AC
  Administered 2014-05-13: 2.5 mg via RESPIRATORY_TRACT

## 2014-05-13 MED ORDER — ALBUTEROL SULFATE HFA 108 (90 BASE) MCG/ACT IN AERS: 2.0000 | INHALATION_SPRAY | Freq: Four times a day (QID) | RESPIRATORY_TRACT | Status: DC | PRN

## 2014-05-13 MED ORDER — PREDNISONE 10 MG PO TABS: ORAL_TABLET | ORAL | Status: DC

## 2014-05-13 NOTE — Progress Notes (Signed)
Pre visit review using our clinic review tool, if applicable. No additional management support is needed unless otherwise documented below in the visit note. 

## 2014-05-13 NOTE — Assessment & Plan Note (Signed)
New.  Suspect this is due to PND, seasonal allergies and possible viral illness.  No evidence of bacterial infxn on PE- no need for abx.  Since sxs improved s/p neb tx, pt given albuterol HFA for home use.  Start pred taper and cough meds prn.  Reviewed supportive care and red flags that should prompt return.  Pt expressed understanding and is in agreement w/ plan.

## 2014-05-13 NOTE — Progress Notes (Signed)
   Subjective:    Patient ID: Jerry Simpson, male    DOB: [birth year], 68 y.o.   MRN: [id number]  HPI Cough- sxs started ~2-3 weeks ago.  Dry, nonproductive.  Taking Allegra daily w/o relief.  Denies SOB.  No fevers.  No sinus pain/pressure.  + PND.  + sick contacts.  Hx of seasonal allergies.   Review of Systems For ROS see HPI     Objective:   Physical Exam  Constitutional: He appears well-developed and well-nourished. No distress.  HENT:  Head: Normocephalic and atraumatic.  No TTP over sinuses + turbinate edema + PND TMs normal bilaterally  Eyes: Conjunctivae and EOM are normal. Pupils are equal, round, and reactive to light.  Neck: Normal range of motion. Neck supple.  Cardiovascular: Normal rate, regular rhythm and normal heart sounds.   Pulmonary/Chest: Effort normal and breath sounds normal. No respiratory distress. He has no wheezes.  Dry cough w/ decreased air movement.  Cough and air movement improved s/p neb tx  Lymphadenopathy:    He has no cervical adenopathy.  Skin: Skin is warm and dry.          Assessment & Plan:

## 2014-05-13 NOTE — Patient Instructions (Signed)
Cancel the December appt, follow up as scheduled for January Mucinex DM for cough Albuterol inhaler- 2 puffs as needed- for coughing spells Take the Prednisone as directed- take w/ food Continue the Allegra daily Drink plenty of fluids REST! Have a Happy Thanksgiving and Merry Christmas!

## 2014-05-20 ENCOUNTER — Other Ambulatory Visit: Payer: Self-pay | Admitting: General Practice

## 2014-05-20 MED ORDER — FLUTICASONE PROPIONATE 50 MCG/ACT NA SUSP: 1.0000 | Freq: Two times a day (BID) | NASAL | Status: DC | PRN

## 2014-05-22 ENCOUNTER — Ambulatory Visit: Payer: Medicare Other | Admitting: Family Medicine

## 2014-07-08 ENCOUNTER — Other Ambulatory Visit: Payer: Self-pay | Admitting: General Practice

## 2014-07-08 DIAGNOSIS — E785 Hyperlipidemia, unspecified: Secondary | ICD-10-CM

## 2014-07-08 MED ORDER — ATORVASTATIN CALCIUM 20 MG PO TABS: 20.0000 mg | ORAL_TABLET | Freq: Every day | ORAL | Status: DC

## 2014-07-08 MED ORDER — PANTOPRAZOLE SODIUM 40 MG PO TBEC: DELAYED_RELEASE_TABLET | ORAL | Status: DC

## 2014-07-08 NOTE — Telephone Encounter (Signed)
Med filled.  

## 2014-07-09 ENCOUNTER — Ambulatory Visit (INDEPENDENT_AMBULATORY_CARE_PROVIDER_SITE_OTHER): Payer: PPO | Admitting: Family Medicine

## 2014-07-09 ENCOUNTER — Encounter: Payer: Self-pay | Admitting: Family Medicine

## 2014-07-09 VITALS — BP 140/86 | HR 76 | Temp 98.2°F | Resp 16 | Ht 74.5 in | Wt 223.2 lb

## 2014-07-09 DIAGNOSIS — I2510 Atherosclerotic heart disease of native coronary artery without angina pectoris: Secondary | ICD-10-CM

## 2014-07-09 DIAGNOSIS — G4730 Sleep apnea, unspecified: Secondary | ICD-10-CM

## 2014-07-09 DIAGNOSIS — Z23 Encounter for immunization: Secondary | ICD-10-CM

## 2014-07-09 DIAGNOSIS — D494 Neoplasm of unspecified behavior of bladder: Secondary | ICD-10-CM

## 2014-07-09 DIAGNOSIS — Z0000 Encounter for general adult medical examination without abnormal findings: Secondary | ICD-10-CM

## 2014-07-09 DIAGNOSIS — E785 Hyperlipidemia, unspecified: Secondary | ICD-10-CM

## 2014-07-09 NOTE — Progress Notes (Signed)
Pre visit review using our clinic review tool, if applicable. No additional management support is needed unless otherwise documented below in the visit note. 

## 2014-07-09 NOTE — Assessment & Plan Note (Signed)
Chronic problem.  Tolerating statin w/o difficulty.  Due to previous stent, LDL goal is ~70.  Check labs.  Adjust meds prn

## 2014-07-09 NOTE — Assessment & Plan Note (Signed)
New.  Pt dx'd by Dr Jeffie Pollock and has appt upcoming at [facility].  Will follow along.  Offered my support.

## 2014-07-09 NOTE — Assessment & Plan Note (Signed)
New.  Wife reports loud snoring and frequent breathing pauses.  This is very worrisome to her.  Based on this, will refer to pulmonary for complete evaluation and tx prn.

## 2014-07-09 NOTE — Assessment & Plan Note (Signed)
Pt's PE WNL.  UTD on urology and colonoscopy.  Written screening schedule updated and given to pt. Pneumovax given today.  Check labs.  Anticipatory guidance provided.

## 2014-07-09 NOTE — Progress Notes (Signed)
   Subjective:    Patient ID: Jerry Simpson, male    DOB: December 14, 1945, 69 y.o.   MRN: [id number]  HPI Here today for CPE.  Risk Factors: Hyperlipidemia- chronic problem, on Lipitor. CAD- s/p 2 stents.  Following w/ Dr Ron Parker.  On statin, ASA 81mg . Bladder tumor- has appt w/ UroOncology at [facility] in Feb. Snoring- wife is concerned for possible sleep apnea.  Pt is snoring 'very loudly' per wife's report and will have frequent breathing pauses. Physical Activity: no regular activity but just recently re-joined gym Fall Risk: low Depression: Denies current sxs Hearing: normal to conversational tones, + tinnitus, mildly decreased to whispered voice ADL's: independent Cognitive: normal linear thought process, memory and attention intact Home Safety: safe at home, lives w/ wife Height, Weight, BMI, Visual Acuity: see vitals, vision corrected to 20/20 w/ cataract surgery Counseling: UTD on colonoscopy, Urology Labs Ordered: See A&P Care Plan: See A&P    Review of Systems Patient reports no vision/hearing changes, anorexia, fever ,adenopathy, persistant/recurrent hoarseness, swallowing issues, chest pain, palpitations, edema, persistant/recurrent cough, hemoptysis, dyspnea (rest,exertional, paroxysmal nocturnal), gastrointestinal  bleeding (melena, rectal bleeding), abdominal pain, excessive heart burn, syncope, focal weakness, memory loss, numbness & tingling, skin/hair/nail changes, depression, anxiety, abnormal bruising/bleeding, musculoskeletal symptoms/signs.     Objective:   Physical Exam General Appearance:    Alert, cooperative, no distress, appears stated age  Head:    Normocephalic, without obvious abnormality, atraumatic  Eyes:    PERRL, conjunctiva/corneas clear, EOM's intact, fundi    benign, both eyes       Ears:    Normal TM's and external ear canals, both ears  Nose:   Nares normal, septum midline, mucosa normal, no drainage   or sinus tenderness  Throat:   Lips, mucosa, and  tongue normal; teeth and gums normal  Neck:   Supple, symmetrical, trachea midline, no adenopathy;       thyroid:  No enlargement/tenderness/nodules  Back:     Symmetric, no curvature, ROM normal, no CVA tenderness  Lungs:     Clear to auscultation bilaterally, respirations unlabored  Chest wall:    No tenderness or deformity  Heart:    Regular rate and rhythm, S1 and S2 normal, no murmur, rub   or gallop  Abdomen:     Soft, non-tender, bowel sounds active all four quadrants,    no masses, no organomegaly  Genitalia:    Deferred to urology  Rectal:    Extremities:   Extremities normal, atraumatic, no cyanosis or edema  Pulses:   2+ and symmetric all extremities  Skin:   Skin color, texture, turgor normal, no rashes or lesions  Lymph nodes:   Cervical, supraclavicular, and axillary nodes normal  Neurologic:   CNII-XII intact. Normal strength, sensation and reflexes      throughout          Assessment & Plan:

## 2014-07-09 NOTE — Assessment & Plan Note (Signed)
Pt had stent placed nearly 12 yrs ago.  No longer seeing cards.  On ASA and statin to reduce risk.  Check labs.  Adjust meds prn

## 2014-07-09 NOTE — Patient Instructions (Signed)
Follow up in 6 months to recheck cholesterol We'll notify you of your lab results and make any changes if needed If you want labs done prior to your visit, please MyChart 1-2 weeks prior so that we can put your labs in [facility] call you with your pulmonary appt Try and make healthy food choices and get regular exercise Call with any questions or concerns Good Luck at [facility]!!

## 2014-07-15 ENCOUNTER — Encounter: Payer: Self-pay | Admitting: General Practice

## 2014-07-15 NOTE — Progress Notes (Signed)
Pt was sent a mychart message

## 2014-07-16 ENCOUNTER — Other Ambulatory Visit (INDEPENDENT_AMBULATORY_CARE_PROVIDER_SITE_OTHER): Payer: PPO

## 2014-07-16 ENCOUNTER — Other Ambulatory Visit: Payer: Self-pay | Admitting: Family Medicine

## 2014-07-16 DIAGNOSIS — I2510 Atherosclerotic heart disease of native coronary artery without angina pectoris: Secondary | ICD-10-CM

## 2014-07-16 DIAGNOSIS — E785 Hyperlipidemia, unspecified: Secondary | ICD-10-CM

## 2014-07-16 LAB — CBC WITH DIFFERENTIAL/PLATELET
Basophils Absolute: 0 10*3/uL (ref 0.0–0.1)
Basophils Relative: 0.8 % (ref 0.0–3.0)
Eosinophils Absolute: 0.2 10*3/uL (ref 0.0–0.7)
Eosinophils Relative: 2.5 % (ref 0.0–5.0)
HCT: 44.2 % (ref 39.0–52.0)
Hemoglobin: 15.3 g/dL (ref 13.0–17.0)
LYMPHS ABS: 1.6 10*3/uL (ref 0.7–4.0)
LYMPHS PCT: 26.4 % (ref 12.0–46.0)
MCHC: 34.7 g/dL (ref 30.0–36.0)
MCV: 85.6 fl (ref 78.0–100.0)
Monocytes Absolute: 0.6 10*3/uL (ref 0.1–1.0)
Monocytes Relative: 9.3 % (ref 3.0–12.0)
Neutro Abs: 3.6 10*3/uL (ref 1.4–7.7)
Neutrophils Relative %: 61 % (ref 43.0–77.0)
Platelets: 247 10*3/uL (ref 150.0–400.0)
RBC: 5.17 Mil/uL (ref 4.22–5.81)
RDW: 13 % (ref 11.5–15.5)
WBC: 5.9 10*3/uL (ref 4.0–10.5)

## 2014-07-16 LAB — BASIC METABOLIC PANEL
BUN: 15 mg/dL (ref 6–23)
CALCIUM: 9.4 mg/dL (ref 8.4–10.5)
CO2: 28 meq/L (ref 19–32)
CREATININE: 1.03 mg/dL (ref 0.40–1.50)
Chloride: 104 mEq/L (ref 96–112)
GFR: 76.65 mL/min (ref >60.00)
Glucose, Bld: 130 mg/dL — ABNORMAL HIGH (ref 70–99)
Potassium: 4.8 mEq/L (ref 3.5–5.1)
Sodium: 139 mEq/L (ref 135–145)

## 2014-07-16 LAB — TSH: TSH: 1.97 u[IU]/mL (ref 0.35–4.50)

## 2014-07-16 LAB — LIPID PANEL
Cholesterol: 129 mg/dL (ref 0–200)
HDL: 26.1 mg/dL — AB (ref >39.00)
NonHDL: 102.9
Total CHOL/HDL Ratio: 5
Triglycerides: 212 mg/dL — ABNORMAL HIGH (ref 0.0–149.0)
VLDL: 42.4 mg/dL — ABNORMAL HIGH (ref 0.0–40.0)

## 2014-07-16 LAB — HEPATIC FUNCTION PANEL
ALBUMIN: 4.3 g/dL (ref 3.5–5.2)
ALT: 28 U/L (ref 0–53)
AST: 23 U/L (ref 0–37)
Alkaline Phosphatase: 54 U/L (ref 39–117)
Bilirubin, Direct: 0.2 mg/dL (ref 0.0–0.3)
TOTAL PROTEIN: 7.3 g/dL (ref 6.0–8.3)
Total Bilirubin: 0.6 mg/dL (ref 0.2–1.2)

## 2014-07-16 LAB — LDL CHOLESTEROL, DIRECT: LDL DIRECT: 70 mg/dL

## 2014-07-17 ENCOUNTER — Encounter: Payer: Self-pay | Admitting: Family Medicine

## 2014-07-17 ENCOUNTER — Other Ambulatory Visit (INDEPENDENT_AMBULATORY_CARE_PROVIDER_SITE_OTHER): Payer: PPO

## 2014-07-17 DIAGNOSIS — R739 Hyperglycemia, unspecified: Secondary | ICD-10-CM

## 2014-07-17 DIAGNOSIS — R7309 Other abnormal glucose: Secondary | ICD-10-CM

## 2014-07-17 LAB — HEMOGLOBIN A1C: Hgb A1c MFr Bld: 6.1 % (ref 4.6–6.5)

## 2014-07-17 MED ORDER — FENOFIBRATE 160 MG PO TABS: 160.0000 mg | ORAL_TABLET | Freq: Every day | ORAL | Status: DC

## 2014-07-17 NOTE — Telephone Encounter (Signed)
Med filled.  

## 2014-08-09 ENCOUNTER — Institutional Professional Consult (permissible substitution): Payer: PPO | Admitting: Pulmonary Disease

## 2014-09-12 ENCOUNTER — Telehealth: Payer: Self-pay | Admitting: General Practice

## 2014-09-12 ENCOUNTER — Other Ambulatory Visit: Payer: Self-pay | Admitting: General Practice

## 2014-09-12 ENCOUNTER — Other Ambulatory Visit: Payer: Self-pay | Admitting: Family Medicine

## 2014-09-12 DIAGNOSIS — R399 Unspecified symptoms and signs involving the genitourinary system: Secondary | ICD-10-CM

## 2014-09-12 NOTE — Telephone Encounter (Signed)
Called pt and LMOVM to return call, received a fax from [facility] stating that they would like pt to come to our office for Urine dip and Culture.   Labs placed, pt needs lab appt.

## 2014-09-12 NOTE — Telephone Encounter (Signed)
Urine results need to be faxed to [facility] [phone] Attn: Carmelina Noun, PA

## 2014-09-12 NOTE — Telephone Encounter (Signed)
Pt returning your call would like to speak with you regarding urine appointment pt stated he was under the impression an appointment is not needed.

## 2014-09-12 NOTE — Telephone Encounter (Signed)
Spoke with pt who informed that he was unable to get through to schedule an appt due to being told that "our office does not do labs for outside providers" Instead of a note being sent to provider. Pt had labs completed at [facility].

## 2014-10-09 ENCOUNTER — Other Ambulatory Visit: Payer: Self-pay | Admitting: Family Medicine

## 2014-10-09 NOTE — Telephone Encounter (Signed)
Med filled.  

## 2014-10-28 ENCOUNTER — Encounter: Payer: Self-pay | Admitting: Physician Assistant

## 2014-10-28 ENCOUNTER — Telehealth: Payer: Self-pay | Admitting: Family Medicine

## 2014-10-28 ENCOUNTER — Ambulatory Visit (INDEPENDENT_AMBULATORY_CARE_PROVIDER_SITE_OTHER): Payer: PPO | Admitting: Physician Assistant

## 2014-10-28 VITALS — BP 151/95 | HR 97 | Temp 99.2°F | Wt 216.0 lb

## 2014-10-28 DIAGNOSIS — J069 Acute upper respiratory infection, unspecified: Secondary | ICD-10-CM

## 2014-10-28 DIAGNOSIS — B9789 Other viral agents as the cause of diseases classified elsewhere: Secondary | ICD-10-CM

## 2014-10-28 MED ORDER — METHYLPREDNISOLONE 4 MG PO TBPK: ORAL_TABLET | ORAL | Status: DC

## 2014-10-28 NOTE — Patient Instructions (Signed)
Printer not working at time of visit.  AVS typed into word document and printed for patient.

## 2014-10-28 NOTE — Telephone Encounter (Signed)
Left message for patient to return my call.

## 2014-10-28 NOTE — Telephone Encounter (Signed)
Any viral illness is potentially contagious, but as long as he is not running a fever, washes his hands and avoids coughing in people's faces, there are no worries.

## 2014-10-28 NOTE — Progress Notes (Signed)
Patient presents to clinic today c/o 3 days of dry cough, PND, chest tightness and fatigue.  Denies SOB, chest pain, sinus pain or ear pain.  Denies fever, chills. States symptoms started after doing some yard work.  Denies recent travel or sick contact.  Past Medical History  Diagnosis Date  . Hyperlipidemia   . Environmental allergies   . CAD (coronary artery disease)   . GERD (gastroesophageal reflux disease)   . Allergy   . BPH (benign prostatic hyperplasia)     elevated PSA, Dr Jeffie Pollock  . Adenomatous polyps   . Bladder tumor     Current Outpatient Prescriptions on File Prior to Visit  Medication Sig Dispense Refill  . albuterol (PROVENTIL HFA;VENTOLIN HFA) 108 (90 BASE) MCG/ACT inhaler Inhale 2 puffs into the lungs every 6 (six) hours as needed for wheezing or shortness of breath. 1 Inhaler 0  . aspirin EC 81 MG tablet Take 81 mg by mouth daily.     Marland Kitchen atorvastatin (LIPITOR) 20 MG tablet Take 1 tablet (20 mg total) by mouth daily. 90 tablet 1  . Coenzyme Q10 (CO Q 10 PO) Take by mouth. Takes 300 mg daily    . fenofibrate 160 MG tablet TAKE 1 TABLET DAILY. 90 tablet 1  . fexofenadine (ALLEGRA) 180 MG tablet Take 90 mg by mouth daily.     . fluticasone (FLONASE) 50 MCG/ACT nasal spray Place 1 spray into both nostrils 2 (two) times daily as needed for rhinitis. 16 g 2  . Multiple Vitamin (MULTIVITAMIN) tablet Take 1 tablet by mouth daily.    Marland Kitchen nystatin-triamcinolone (MYCOLOG II) cream Apply 1 application topically as needed.     No current facility-administered medications on file prior to visit.    Allergies  Allergen Reactions  . Sulfonamide Derivatives Rash    RASH  Because of a history of documented adverse serious drug reaction;Medi Alert bracelet  is recommended    Family History  Problem Relation Age of Onset  . Colon cancer Neg Hx   . Stomach cancer Neg Hx   . Hypertension Father   . Heart attack Father 46  . Diabetes Father   . Cancer Brother     lung cancer  ; leukemia terminally  . Heart attack Brother 66  . Heart disease Paternal Uncle 73  . Stroke Maternal Grandmother     >65    History   Social History  . Marital Status: Married    Spouse Name: N/A  . Number of Children: N/A  . Years of Education: N/A   Social History Main Topics  . Smoking status: Never Smoker   . Smokeless tobacco: Never Used  . Alcohol Use: 3.5 oz/week    7 drink(s) per week     Comment:  < 7/ week  . Drug Use: No  . Sexual Activity: Not on file   Other Topics Concern  . None   Social History Narrative   Review of Systems - See HPI.  All other ROS are negative.  BP 151/95 mmHg  Pulse 97  Temp(Src) 99.2 F (37.3 C)  Wt 216 lb (97.977 kg)  SpO2 95%  Physical Exam  Constitutional: He is oriented to person, place, and time and well-developed, well-nourished, and in no distress.  HENT:  Head: Normocephalic and atraumatic.  Right Ear: External ear normal.  Left Ear: External ear normal.  Nose: Nose normal.  Mouth/Throat: Oropharynx is clear and moist. No oropharyngeal exudate.  TM within normal limits bilaterally.  Eyes: Conjunctivae are normal.  Neck: Neck supple.  Cardiovascular: Normal rate, regular rhythm, normal heart sounds and intact distal pulses.   Pulmonary/Chest: Effort normal and breath sounds normal. No respiratory distress. He has no wheezes. He has no rales. He exhibits no tenderness.  Lymphadenopathy:    He has no cervical adenopathy.  Neurological: He is alert and oriented to person, place, and time. No cranial nerve deficit.  Skin: Skin is warm and dry. No rash noted.  Psychiatric: Affect normal.  Vitals reviewed.   No results found for this or any previous visit (from the past 2160 hour(s)).  Assessment/Plan: Viral URI with cough Increase fluids.  Saline nasal spray. Mucinex. Continue Allegra.  Begin Albuterol and Flonase.  Will give Medrol pack to start tomorrow if chest tightness not improving with  Albuterol.

## 2014-10-28 NOTE — Assessment & Plan Note (Signed)
Increase fluids.  Saline nasal spray. Mucinex. Continue Allegra.  Begin Albuterol and Flonase.  Will give Medrol pack to start tomorrow if chest tightness not improving with Albuterol.

## 2014-10-28 NOTE — Telephone Encounter (Signed)
Caller name: Raffi Relation to pt: self Call back number: [phone] Pharmacy:  Reason for call:   Wants to know if he is contagious?

## 2014-10-28 NOTE — Progress Notes (Signed)
Pre visit review using our clinic review tool, if applicable. No additional management support is needed unless otherwise documented below in the visit note. 

## 2014-10-30 ENCOUNTER — Telehealth: Payer: Self-pay | Admitting: Family Medicine

## 2014-10-30 MED ORDER — DOXYCYCLINE HYCLATE 100 MG PO CAPS: 100.0000 mg | ORAL_CAPSULE | Freq: Two times a day (BID) | ORAL | Status: DC

## 2014-10-30 NOTE — Telephone Encounter (Signed)
Caller name: Robertson Relation to pt: self Call back number: [phone] Pharmacy: [facility]  Reason for call:   Jerry Simpson. Still having symptoms. Having orange drainage when blowing nose. Cough is better but lingering.

## 2014-10-30 NOTE — Telephone Encounter (Signed)
Informed patient of this.  °

## 2014-10-30 NOTE — Telephone Encounter (Signed)
Antibiotic Doxycycline sent to pharmacy.  Take as directed.

## 2014-11-01 NOTE — Telephone Encounter (Signed)
Pt states he is still coughing, not feeling any better would like to speak with PA before RX is sent. Please advise

## 2014-11-01 NOTE — Telephone Encounter (Signed)
Doxycycline is a first line antibiotic for sinusitis/bronchitis etc.  He is to continue antibiotic until course is complete. In regards to Tramadol he can speak with his PCP as I only saw him for an acute and will not be taking over chronic medication management.

## 2014-11-01 NOTE — Telephone Encounter (Signed)
Pt c/o coughing productive (yellowish) . Pt on day 3 of the doxycycline .pt doesn't think highly of doxy believes its a low spectrum low effective antibiotic.Marland Kitchenadvised pt to continue the course and if no better by Monday to give [place] a call or if getting worst to go to urgent care. Pt also had a question regarding ultram it was suggested by another provider Rosiland Oz (pulmonology) in 2008 to replace codeine?  Pt would like [place] to call him.Marland Kitchen

## 2014-11-01 NOTE — Telephone Encounter (Signed)
I have no record that pt is on Codeine so I do not know what he is referring to.  Please clarify w/ pt.

## 2014-11-01 NOTE — Telephone Encounter (Signed)
Called PT and LMOVM to return call.

## 2014-11-05 NOTE — Telephone Encounter (Signed)
Called pt again yesterday. Could you please find out why pt was on Codeine in 2008 (cannot find documentation in the chart)? Also why was tramadol suggested for pain? What symptoms is pt currently having if any?

## 2014-11-07 NOTE — Telephone Encounter (Signed)
Unfortunately prednisone can have side effects and this is not prescribed w/o an appt so that we can be sure that we are treating him appropriately.  He can always call his pulmonologist if he feels they are more familiar and will give him the prednisone w/o an appt

## 2014-11-07 NOTE — Telephone Encounter (Signed)
Patient states Dr. Joya Gaskins prescribed the Tramadol for coughing (help suppress cough).  Patient spoke to insurance about Copay and discovered it would be too expensive.  He is not interested in the Tramadol for this reason.  He states he still has a cough and "has snot."  His wife reports the same symptoms.  Offered office appointment, but patient states he feels too well to have pneumonia and is not sure what there would be to find in office visit.  He is requesting a prednisone dose pack.  He states he has been taking the Claritin regularly.  Please advise.

## 2014-11-08 NOTE — Telephone Encounter (Signed)
Patient scheduled appointment for 11/12/14- offered earlier appointment, but patient declined.

## 2014-11-12 ENCOUNTER — Ambulatory Visit: Payer: PPO | Admitting: Family Medicine

## 2014-12-19 ENCOUNTER — Encounter: Payer: Self-pay | Admitting: Family Medicine

## 2015-01-28 ENCOUNTER — Encounter: Payer: Self-pay | Admitting: Family Medicine

## 2015-02-20 ENCOUNTER — Other Ambulatory Visit: Payer: Self-pay | Admitting: Family Medicine

## 2015-02-22 ENCOUNTER — Other Ambulatory Visit: Payer: Self-pay | Admitting: Family Medicine

## 2015-02-25 NOTE — Telephone Encounter (Signed)
Medication filled to pharmacy as requested.   

## 2015-04-02 ENCOUNTER — Ambulatory Visit (INDEPENDENT_AMBULATORY_CARE_PROVIDER_SITE_OTHER): Payer: PPO

## 2015-04-02 DIAGNOSIS — Z23 Encounter for immunization: Secondary | ICD-10-CM | POA: Diagnosis not present

## 2015-05-13 ENCOUNTER — Encounter: Payer: Self-pay | Admitting: Medical

## 2015-05-13 ENCOUNTER — Ambulatory Visit (INDEPENDENT_AMBULATORY_CARE_PROVIDER_SITE_OTHER): Payer: PPO | Admitting: Medical

## 2015-05-13 VITALS — BP 120/80 | HR 89 | Temp 98.1°F | Ht 74.5 in | Wt 222.0 lb

## 2015-05-13 DIAGNOSIS — J309 Allergic rhinitis, unspecified: Secondary | ICD-10-CM

## 2015-05-13 MED ORDER — FLUTICASONE PROPIONATE 50 MCG/ACT NA SUSP: 2.0000 | Freq: Every day | NASAL | Status: AC

## 2015-05-13 MED ORDER — AZITHROMYCIN 250 MG PO TABS: ORAL_TABLET | ORAL | Status: DC

## 2015-05-13 NOTE — Progress Notes (Signed)
Subjective:    Patient ID: Jerry Simpson, male    DOB: August 21, 1945, 69 y.o.   MRN: [id number]  HPI   Pt in states yesterday afternoon felt mild nasal congestion. But then last night was coughing up a lot of mucous. No fever, no chills or sweats.   Pt took some mucinex last night.   No sinus pressure but does report a lot  nasal congestion.  Pt states various family members have Upper respiratory type illness.   Pt daughter had pneumonia.  No fever, chills or sweats. Pt has some concern that he will worsen over the holidays.   Review of Systems  Constitutional: Negative for fever, chills, diaphoresis, activity change and fatigue.  HENT: Positive for congestion and postnasal drip. Negative for sinus pressure.   Respiratory: Positive for cough. Negative for chest tightness and shortness of breath.        Productive.  Cardiovascular: Negative for chest pain, palpitations and leg swelling.  Gastrointestinal: Negative for nausea, vomiting and abdominal pain.  Musculoskeletal: Negative for neck pain and neck stiffness.  Neurological: Negative for dizziness and weakness.  Psychiatric/Behavioral: Negative for behavioral problems, confusion and agitation. The patient is not nervous/anxious.    Past Medical History  Diagnosis Date  . Hyperlipidemia   . Environmental allergies   . CAD (coronary artery disease)   . GERD (gastroesophageal reflux disease)   . Allergy   . BPH (benign prostatic hyperplasia)     elevated PSA, Dr Jeffie Pollock  . Adenomatous polyps   . Bladder tumor     Social History   Social History  . Marital Status: Married    Spouse Name: N/A  . Number of Children: N/A  . Years of Education: N/A   Occupational History  . Not on file.   Social History Main Topics  . Smoking status: Never Smoker   . Smokeless tobacco: Never Used  . Alcohol Use: 3.5 oz/week    7 drink(s) per week     Comment:  < 7/ week  . Drug Use: No  . Sexual Activity: Not on file    Other Topics Concern  . Not on file   Social History Narrative    Past Surgical History  Procedure Laterality Date  . Angioplasty  2004    stent placement ;sees Dr Ron Parker  . Tonsillectomy  1962  . Cataract extraction, bilateral  2012  . Colonoscopy with polypectomy      X2, Dr Henrene Pastor    Family History  Problem Relation Age of Onset  . Colon cancer Neg Hx   . Stomach cancer Neg Hx   . Hypertension Father   . Heart attack Father 20  . Diabetes Father   . Cancer Brother     lung cancer ; leukemia terminally  . Heart attack Brother 67  . Heart disease Paternal Uncle 62  . Stroke Maternal Grandmother     >65    Allergies  Allergen Reactions  . Sulfonamide Derivatives Rash    RASH  Because of a history of documented adverse serious drug reaction;Medi Alert bracelet  is recommended  . Claritin [Loratadine]     Pt states it affects his prostate.     Current Outpatient Prescriptions on File Prior to Visit  Medication Sig Dispense Refill  . albuterol (PROVENTIL HFA;VENTOLIN HFA) 108 (90 BASE) MCG/ACT inhaler Inhale 2 puffs into the lungs every 6 (six) hours as needed for wheezing or shortness of breath. 1 Inhaler 0  . aspirin EC  81 MG tablet Take 81 mg by mouth daily.     Marland Kitchen atorvastatin (LIPITOR) 20 MG tablet TAKE 1 TABLET ONCE DAILY. 90 tablet 0  . Coenzyme Q10 (CO Q 10 PO) Take by mouth. Takes 300 mg daily    . doxycycline (VIBRAMYCIN) 100 MG capsule Take 1 capsule (100 mg total) by mouth 2 (two) times daily. 14 capsule 0  . fenofibrate 160 MG tablet TAKE 1 TABLET DAILY. 90 tablet 1  . fexofenadine (ALLEGRA) 180 MG tablet Take 90 mg by mouth daily.     . finasteride (PROSCAR) 5 MG tablet Take 5 mg by mouth.    . Multiple Vitamin (MULTIVITAMIN) tablet Take 1 tablet by mouth daily.    Marland Kitchen nystatin-triamcinolone (MYCOLOG II) cream Apply 1 application topically as needed.    . pantoprazole (PROTONIX) 40 MG tablet TAKE 1 TABLET ONCE DAILY. 90 tablet 0  . tamsulosin (FLOMAX)  0.4 MG CAPS capsule Take 0.4 mg by mouth.     No current facility-administered medications on file prior to visit.    BP 120/80 mmHg  Pulse 89  Temp(Src) 98.1 F (36.7 C) (Oral)  Ht 6' 2.5" (1.892 m)  Wt 222 lb (100.699 kg)  BMI 28.13 kg/m2  SpO2 98%       Objective:   Physical Exam  General  Mental Status - Alert. General Appearance - Well groomed. Not in acute distress.  Skin Rashes- No Rashes.  HEENT Head- Normal. Ear Auditory Canal - Left- Normal. Right - Normal.Tympanic Membrane- Left- Normal. Right- Normal. Eye Sclera/Conjunctiva- Left- Normal. Right- Normal. Nose & Sinuses Nasal Mucosa- Left-  Boggy and Congested. Right-  Boggy and  Congested.Bilateral no  maxillary and  No frontal sinus pressure. Mouth & Throat Lips: Upper Lip- Normal: no dryness, cracking, pallor, cyanosis, or vesicular eruption. Lower Lip-Normal: no dryness, cracking, pallor, cyanosis or vesicular eruption. Buccal Mucosa- Bilateral- No Aphthous ulcers. Oropharynx- No Discharge or Erythema. +pnd. Tonsils: Characteristics- Bilateral- No Erythema or Congestion. Size/Enlargement- Bilateral- No enlargement. Discharge- bilateral-None.  Neck Neck- Supple. No Masses.   Chest and Lung Exam Auscultation: Breath Sounds:-Clear even and unlabored.  Cardiovascular Auscultation:Rythm- Regular, rate and rhythm. Murmurs & Other Heart Sounds:Ausculatation of the heart reveal- No Murmurs.  Lymphatic Head & Neck General Head & Neck Lymphatics: Bilateral: Description- No Localized lymphadenopathy.        Assessment & Plan:  Your symptoms seem to indicate early allergic rhinitis vs uri. But with close contact with various family members who were recently sick and upcoming holidays I am concerned you may get bacterial infection.  Will rx flonase nasal spray and  use your allegra as well. If you worsen over thanksgiving then start azihromycin.  Recommend you show your dermatologist left ear skin  lesion.(no later than jan 2017).

## 2015-05-13 NOTE — Progress Notes (Signed)
Pre visit review using our clinic review tool, if applicable. No additional management support is needed unless otherwise documented below in the visit note. 

## 2015-05-13 NOTE — Patient Instructions (Addendum)
Your symptoms seem to indicate early allergic rhinitis vs uri. But with close contact with various family members who were recently sick and upcoming holidays I am concerned you may get bacterial infection.  Will rx flonase nasal spray and  use your allegra as well. If you worsen over thanksgiving then start azihromycin.  Recommend you show your dermatologist left ear skin lesion.(no later than jan 2017).  For cough you can use tramadol as pulmonologist advised you in past could use for cough.

## 2015-05-20 ENCOUNTER — Encounter: Payer: Self-pay | Admitting: Family Medicine

## 2015-05-21 MED ORDER — PREDNISONE 10 MG PO TABS: ORAL_TABLET | ORAL | Status: DC

## 2015-05-26 ENCOUNTER — Other Ambulatory Visit: Payer: Self-pay | Admitting: Family Medicine

## 2015-05-26 NOTE — Telephone Encounter (Signed)
Medication filled to pharmacy as requested.   

## 2015-06-06 ENCOUNTER — Encounter: Payer: Self-pay | Admitting: Gastroenterology

## 2015-06-11 ENCOUNTER — Encounter: Payer: Self-pay | Admitting: Family Medicine

## 2015-06-11 ENCOUNTER — Other Ambulatory Visit: Payer: Self-pay | Admitting: Family Medicine

## 2015-06-11 DIAGNOSIS — R05 Cough: Secondary | ICD-10-CM

## 2015-06-11 DIAGNOSIS — R059 Cough, unspecified: Secondary | ICD-10-CM

## 2015-06-12 ENCOUNTER — Telehealth: Payer: Self-pay | Admitting: Internal Medicine

## 2015-06-12 ENCOUNTER — Encounter: Payer: Self-pay | Admitting: Internal Medicine

## 2015-06-12 ENCOUNTER — Ambulatory Visit (INDEPENDENT_AMBULATORY_CARE_PROVIDER_SITE_OTHER): Payer: PPO | Admitting: Internal Medicine

## 2015-06-12 VITALS — BP 124/80 | HR 84 | Ht 74.0 in | Wt 218.0 lb

## 2015-06-12 DIAGNOSIS — R05 Cough: Secondary | ICD-10-CM | POA: Diagnosis not present

## 2015-06-12 DIAGNOSIS — R058 Other specified cough: Secondary | ICD-10-CM

## 2015-06-12 MED ORDER — TRAMADOL HCL 50 MG PO TABS: ORAL_TABLET | ORAL | Status: DC

## 2015-06-12 MED ORDER — PREDNISONE 10 MG PO TABS: ORAL_TABLET | ORAL | Status: DC

## 2015-06-12 NOTE — Telephone Encounter (Signed)
Called spoke with pt. He reports he does not need to make an appt since MW stated f/u in 2 weeks if not better. He will call if not better to schedule appt.

## 2015-06-12 NOTE — Telephone Encounter (Signed)
Please see the AVS- he was only to return in 2 wks if not better  He does not need appt scheduled at this time thanks

## 2015-06-12 NOTE — Telephone Encounter (Signed)
Patient requested to go ahead and make the 2wk appt.

## 2015-06-12 NOTE — Progress Notes (Signed)
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 Pulmonary Consult    Referred by Dr. Birdie Riddle. Pt c/o cough x 5 wks- non prod with PND.   onset of this episode was acute assoc with watery nasal d/c that turned dry p couple of days rx with zpak/ pred Best rx is hycodan  Better at hs / worse with voice use.  Only sob during coughing episodes   No obvious day to day or daytime variability or assoc cp or chest tightness, subjective wheeze or overt   hb symptoms. No unusual exp hx or h/o childhood pna/ asthma or knowledge of premature birth.  Sleeping ok without nocturnal  or early am exacerbation  of respiratory  c/o's or need for noct saba. Also denies any obvious fluctuation of symptoms with weather or environmental changes or other aggravating or alleviating factors except as outlined above   Current Medications, Allergies, Complete Past Medical History, Past Surgical History, Family History, and Social History were reviewed in [employer] record.  ROS  The following are not active complaints unless bolded sore throat, dysphagia, dental problems, itching, sneezing,  nasal congestion or excess/ purulent secretions, ear ache,   fever, chills, sweats, unintended wt loss, classically pleuritic or exertional cp, hemoptysis,  orthopnea pnd or leg swelling, presyncope, palpitations, abdominal pain, anorexia, nausea, vomiting, diarrhea  or change in bowel or bladder habits, change in stools or urine, dysuria,hematuria,  rash,  arthralgias, visual complaints, headache, numbness, weakness or ataxia or problems with walking or coordination,  change in mood/affect or memory.           Review of Systems  Constitutional: Negative for fever, chills, activity change, appetite change and unexpected weight change.  HENT: Positive for postnasal drip. Negative for congestion, dental problem, rhinorrhea, sneezing, sore throat, trouble swallowing and voice change.   Eyes: Negative for visual disturbance.  Respiratory: Positive for cough and shortness of breath. Negative for choking.   Cardiovascular: Negative for chest pain and leg swelling.  Gastrointestinal: Negative for nausea, vomiting and abdominal pain.  Genitourinary: Negative for difficulty urinating.  Musculoskeletal: Negative for arthralgias.  Skin: Negative for rash.  Psychiatric/Behavioral: Negative for behavioral problems and confusion.       Objective:   Physical Exam  amb wm nad/ lots of throat clearing   Wt Readings from Last 3 Encounters:  06/12/15 218 lb (98.884 kg)  05/13/15 222 lb (100.699 kg)  10/28/14 216 lb (97.977 kg)    Vital signs reviewed   HEENT: nl dentition, turbinates, and oropharynx. Nl external ear canals without cough reflex   NECK :  without JVD/Nodes/TM/ nl carotid upstrokes bilaterally   LUNGS: no acc muscle use,  Nl contour chest which is clear to A and P bilaterally without cough on insp or exp maneuvers   CV:  RRR  no s3 or murmur or increase in P2, no edema   ABD:  soft and nontender with nl inspiratory excursion in the supine position.  No bruits or organomegaly, bowel sounds nl  MS:  Nl gait/ ext warm without deformities, calf tenderness, cyanosis or clubbing No obvious joint restrictions   SKIN: warm and dry without lesions    NEURO:  alert, approp, nl sensorium with  no motor deficits           Assessment & Plan:

## 2015-06-12 NOTE — Patient Instructions (Addendum)
The key to effective treatment for your cough is eliminating the non-stop cycle of cough you're stuck in long enough to let your airway heal completely and then see if there is anything still making you cough once you stop the cough suppression, but this should take no more than 5 days to figure out  First take delsym two tsp every 12 hours and supplement if needed with  tramadol 50 mg up to 2 every 4 hours to suppress the urge to cough at all or even clear your throat. Swallowing water or using ice chips/non mint and menthol containing candies (such as lifesavers or sugarless jolly ranchers) are also effective.  You should rest your voice and avoid activities that you know make you cough.  Once you have eliminated the cough for 3 straight days try reducing the tramadol first,  then the delsym as tolerated.    Prednisone 10 mg take  4 each am x 2 days,   2 each am x 2 days,  1 each am x 2 days and stop (this is to eliminate allergies and inflammation from coughing)  Protonix (pantoprazole) Take 30-60 min before first meal of the day and Pepcid 20 mg one bedtime  week without the need for cough suppression  GERD (REFLUX)  is an extremely common cause of respiratory symptoms, many times with no significant heartburn at all.    It can be treated with medication, but also with lifestyle changes including avoidance of late meals, excessive alcohol, smoking cessation, and avoid fatty foods, chocolate, peppermint, colas, red wine, and acidic juices such as orange juice.  NO MINT OR MENTHOL PRODUCTS SO NO COUGH DROPS  USE HARD CANDY INSTEAD (jolley ranchers or Stover's or Lifesavers (all available in sugarless versions) NO OIL BASED VITAMINS - use powdered substitutes.  If not better in 2 weeks please return

## 2015-06-16 ENCOUNTER — Encounter: Payer: Self-pay | Admitting: Internal Medicine

## 2015-06-16 NOTE — Assessment & Plan Note (Addendum)
The most common causes of chronic cough in immunocompetent adults include the following: upper airway cough syndrome (UACS), previously referred to as postnasal drip syndrome (PNDS), which is caused by variety of rhinosinus conditions; (2) asthma; (3) GERD; (4) chronic bronchitis from cigarette smoking or other inhaled environmental irritants; (5) nonasthmatic eosinophilic bronchitis; and (6) bronchiectasis.   These conditions, singly or in combination, have accounted for up to 94% of the causes of chronic cough in prospective studies.   Other conditions have constituted no >6% of the causes in prospective studies These have included bronchogenic carcinoma, chronic interstitial pneumonia, sarcoidosis, left ventricular failure, ACEI-induced cough, and aspiration from a condition associated with pharyngeal dysfunction.    Chronic cough is often simultaneously caused by more than one condition. A single cause has been found from 38 to 82% of the time, multiple causes from 18 to 62%. Multiply caused cough has been the result of three diseases up to 42% of the time.       Based on hx and exam, this is most likely:  Classic Upper airway cough syndrome, so named because it's frequently impossible to sort out how much is  CR/sinusitis with freq throat clearing (which can be related to primary GERD)   vs  causing  secondary (" extra esophageal")  GERD from wide swings in gastric pressure that occur with throat clearing, often  promoting self use of mint and menthol lozenges that reduce the lower esophageal sphincter tone and exacerbate the problem further in a cyclical fashion.   These are the same pts (now being labeled as having "irritable larynx syndrome" by some cough centers) who not infrequently have a history of having failed to tolerate ace inhibitors,  dry powder inhalers or biphosphonates or report having atypical reflux symptoms that don't respond to standard doses of PPI , and are easily confused as  having aecopd or asthma flares by even experienced allergists/ pulmonologists.   The first step is to maximize both acid and non-acid rx of GERD  and eliminate cyclical coughing then regroup if the cough persists.  I had an extended discussion with the patient reviewing all relevant studies completed to date and  lasting 35/60 min ov        1) Each maintenance medication was reviewed in detail including most importantly the difference between maintenance and prns and under what circumstances the prns are to be triggered using an action plan format that is not reflected in the computer generated alphabetically organized AVS.    Please see instructions for details which were reviewed in writing and the patient given a copy highlighting the part that I personally wrote and discussed at today's ov.   See instructions for specific recommendations which were reviewed directly with the patient who was given a copy with highlighter outlining the key components.

## 2015-07-14 ENCOUNTER — Other Ambulatory Visit: Payer: Self-pay | Admitting: Family Medicine

## 2015-07-14 NOTE — Telephone Encounter (Signed)
Medication filled to pharmacy as requested.   

## 2015-08-05 DIAGNOSIS — H43813 Vitreous degeneration, bilateral: Secondary | ICD-10-CM | POA: Diagnosis not present

## 2015-08-05 DIAGNOSIS — H35372 Puckering of macula, left eye: Secondary | ICD-10-CM | POA: Diagnosis not present

## 2015-08-05 DIAGNOSIS — Z961 Presence of intraocular lens: Secondary | ICD-10-CM | POA: Diagnosis not present

## 2015-08-05 DIAGNOSIS — H52203 Unspecified astigmatism, bilateral: Secondary | ICD-10-CM | POA: Diagnosis not present

## 2015-08-09 ENCOUNTER — Other Ambulatory Visit: Payer: Self-pay | Admitting: Family Medicine

## 2015-08-12 ENCOUNTER — Telehealth: Payer: Self-pay | Admitting: Family Medicine

## 2015-08-12 DIAGNOSIS — IMO0001 Reserved for inherently not codable concepts without codable children: Secondary | ICD-10-CM

## 2015-08-12 DIAGNOSIS — E785 Hyperlipidemia, unspecified: Secondary | ICD-10-CM

## 2015-08-12 DIAGNOSIS — R030 Elevated blood-pressure reading, without diagnosis of hypertension: Secondary | ICD-10-CM

## 2015-08-12 NOTE — Telephone Encounter (Signed)
Orders placed. Please advise pt is 70 years old, and PSA last drawn 06/2012 result was 4.56. Do I need to order this as well?

## 2015-08-12 NOTE — Telephone Encounter (Signed)
Pt scheduled CPE via Mychart. Pt is requesting labs before his appt. Pt would like to have them completed at the [facility] location.    Please advise.

## 2015-08-12 NOTE — Telephone Encounter (Signed)
Pt follows with urology

## 2015-08-12 NOTE — Telephone Encounter (Signed)
[facility] for labs: Dx: hyperlipidemia Lipid panel LFTs  Dx: elevated BP TSH CBC w/ diff BMP

## 2015-08-13 NOTE — Telephone Encounter (Signed)
Noted Care team updated in chart to include Dr. Jeffie Pollock.

## 2015-08-14 ENCOUNTER — Other Ambulatory Visit (INDEPENDENT_AMBULATORY_CARE_PROVIDER_SITE_OTHER): Payer: PPO

## 2015-08-14 DIAGNOSIS — R7309 Other abnormal glucose: Secondary | ICD-10-CM

## 2015-08-14 DIAGNOSIS — E785 Hyperlipidemia, unspecified: Secondary | ICD-10-CM | POA: Diagnosis not present

## 2015-08-14 DIAGNOSIS — IMO0001 Reserved for inherently not codable concepts without codable children: Secondary | ICD-10-CM

## 2015-08-14 DIAGNOSIS — R030 Elevated blood-pressure reading, without diagnosis of hypertension: Secondary | ICD-10-CM

## 2015-08-14 LAB — HEPATIC FUNCTION PANEL
ALT: 35 U/L (ref 0–53)
AST: 24 U/L (ref 0–37)
Albumin: 4.2 g/dL (ref 3.5–5.2)
Alkaline Phosphatase: 44 U/L (ref 39–117)
BILIRUBIN DIRECT: 0.1 mg/dL (ref 0.0–0.3)
BILIRUBIN TOTAL: 0.5 mg/dL (ref 0.2–1.2)
Total Protein: 7.2 g/dL (ref 6.0–8.3)

## 2015-08-14 LAB — BASIC METABOLIC PANEL
BUN: 16 mg/dL (ref 6–23)
CHLORIDE: 106 meq/L (ref 96–112)
CO2: 28 mEq/L (ref 19–32)
CREATININE: 1.04 mg/dL (ref 0.40–1.50)
Calcium: 9.1 mg/dL (ref 8.4–10.5)
GFR: 75.56 mL/min (ref >60.00)
GLUCOSE: 120 mg/dL — AB (ref 70–99)
POTASSIUM: 4.1 meq/L (ref 3.5–5.1)
Sodium: 139 mEq/L (ref 135–145)

## 2015-08-14 LAB — CBC WITH DIFFERENTIAL/PLATELET
BASOS PCT: 0.9 % (ref 0.0–3.0)
Basophils Absolute: 0.1 10*3/uL (ref 0.0–0.1)
EOS ABS: 0.1 10*3/uL (ref 0.0–0.7)
EOS PCT: 2.3 % (ref 0.0–5.0)
HCT: 42.8 % (ref 39.0–52.0)
HEMOGLOBIN: 14.5 g/dL (ref 13.0–17.0)
LYMPHS ABS: 1.8 10*3/uL (ref 0.7–4.0)
Lymphocytes Relative: 29 % (ref 12.0–46.0)
MCHC: 33.8 g/dL (ref 30.0–36.0)
MCV: 86.4 fl (ref 78.0–100.0)
MONO ABS: 0.6 10*3/uL (ref 0.1–1.0)
Monocytes Relative: 8.9 % (ref 3.0–12.0)
NEUTROS PCT: 58.9 % (ref 43.0–77.0)
Neutro Abs: 3.6 10*3/uL (ref 1.4–7.7)
PLATELETS: 268 10*3/uL (ref 150.0–400.0)
RBC: 4.95 Mil/uL (ref 4.22–5.81)
RDW: 13 % (ref 11.5–15.5)
WBC: 6.2 10*3/uL (ref 4.0–10.5)

## 2015-08-14 LAB — LIPID PANEL
CHOL/HDL RATIO: 5
CHOLESTEROL: 139 mg/dL (ref 0–200)
HDL: 27.8 mg/dL — AB (ref >39.00)
LDL CALC: 85 mg/dL (ref 0–99)
NonHDL: 111.1
TRIGLYCERIDES: 132 mg/dL (ref 0.0–149.0)
VLDL: 26.4 mg/dL (ref 0.0–40.0)

## 2015-08-14 LAB — HEMOGLOBIN A1C: HEMOGLOBIN A1C: 5.9 % (ref 4.6–6.5)

## 2015-08-14 LAB — TSH: TSH: 1.49 u[IU]/mL (ref 0.35–4.50)

## 2015-08-18 ENCOUNTER — Ambulatory Visit (INDEPENDENT_AMBULATORY_CARE_PROVIDER_SITE_OTHER): Payer: PPO | Admitting: Family Medicine

## 2015-08-18 ENCOUNTER — Encounter: Payer: Self-pay | Admitting: Family Medicine

## 2015-08-18 VITALS — BP 126/80 | HR 82 | Temp 97.7°F | Resp 16 | Ht 74.0 in | Wt 223.1 lb

## 2015-08-18 DIAGNOSIS — E785 Hyperlipidemia, unspecified: Secondary | ICD-10-CM | POA: Diagnosis not present

## 2015-08-18 DIAGNOSIS — Z0000 Encounter for general adult medical examination without abnormal findings: Secondary | ICD-10-CM | POA: Diagnosis not present

## 2015-08-18 NOTE — Patient Instructions (Signed)
Follow up in 6 months to recheck cholesterol Your labs look great!  No changes at this time You are up to date on colonoscopy- not due until next year You are up to date on your immunizations- yay! Call with any questions or concerns If you want to join [place] at the new [place] office, any scheduled appointments will automatically transfer and we will see you at [address] [place] [address], [place], [place] [address] [facility]) Have a great week!!!

## 2015-08-18 NOTE — Progress Notes (Signed)
Pre visit review using our clinic review tool, if applicable. No additional management support is needed unless otherwise documented below in the visit note. 

## 2015-08-18 NOTE — Assessment & Plan Note (Signed)
Pt's labs WNL but he did stop Fenofibrate due to bursitis.  Plan is to repeat labs in 6 months to make sure triglycerides don't jump back up after stopping medication.  Pt expressed understanding and is in agreement w/ plan.

## 2015-08-18 NOTE — Progress Notes (Signed)
   Subjective:    Patient ID: Jerry Simpson, male    DOB: [birth year], 70 y.o.   MRN: [id number]  HPI Here today for CPE.  Risk Factors: Hyperlipidemia- chronic problem, on Lipitor.  Stopped fenofibrate due to shoulder pain  Physical Activity: exercise 2-3x/week Fall Risk: low risk Depression: denies current sxs Hearing: normal to conversational tones, whispered voice at 6 ft ADL's: independent Cognitive: normal linear thought process, memory and attention intact Home Safety: safe at home, lives w/ wife Height, Weight, BMI, Visual Acuity: see vitals, vision corrected to 20/20 w/ glasses Counseling: UTD on colonoscopy Henrene Pastor), Urology Jeffie Pollock).  UTD on immunizations. Care team reviewed and updated w/ pt Labs Ordered: See A&P Care Plan: See A&P    Review of Systems Patient reports no vision/hearing changes, anorexia, fever ,adenopathy, persistant/recurrent hoarseness, swallowing issues, chest pain, palpitations, edema, persistant/recurrent cough, hemoptysis, gastrointestinal  bleeding (melena, rectal bleeding), abdominal pain, excessive heart burn, GU symptoms (dysuria, hematuria, voiding/incontinence issues) syncope, focal weakness, memory loss, numbness & tingling, skin/hair/nail changes, depression, anxiety, abnormal bruising/bleeding.   + shortness of breath w/ stairs    Objective:   Physical Exam General Appearance:    Alert, cooperative, no distress, appears stated age  Head:    Normocephalic, without obvious abnormality, atraumatic  Eyes:    PERRL, conjunctiva/corneas clear, EOM's intact, fundi    benign, both eyes       Ears:    Normal TM's and external ear canals, both ears  Nose:   Nares normal, septum midline, mucosa normal, no drainage   or sinus tenderness  Throat:   Lips, mucosa, and tongue normal; teeth and gums normal  Neck:   Supple, symmetrical, trachea midline, no adenopathy;       thyroid:  No enlargement/tenderness/nodules  Back:     Symmetric, no  curvature, ROM normal, no CVA tenderness  Lungs:     Clear to auscultation bilaterally, respirations unlabored  Chest wall:    No tenderness or deformity  Heart:    Regular rate and rhythm, S1 and S2 normal, no murmur, rub   or gallop  Abdomen:     Soft, non-tender, bowel sounds active all four quadrants,    no masses, no organomegaly  Genitalia:    Deferred to urology  Rectal:    Extremities:   Extremities normal, atraumatic, no cyanosis or edema  Pulses:   2+ and symmetric all extremities  Skin:   Skin color, texture, turgor normal, no rashes or lesions  Lymph nodes:   Cervical, supraclavicular, and axillary nodes normal  Neurologic:   CNII-XII intact. Normal strength, sensation and reflexes      throughout          Assessment & Plan:

## 2015-08-18 NOTE — Assessment & Plan Note (Signed)
Pt's PE WNL.  UTD on colonoscopy- due next year, and urology.  UTD on immunizations.  Written screening schedule updated and given to pt.  Check labs.  Anticipatory guidance provided.

## 2015-08-27 ENCOUNTER — Other Ambulatory Visit: Payer: Self-pay | Admitting: Family Medicine

## 2015-08-27 NOTE — Telephone Encounter (Signed)
Medication filled to pharmacy as requested.   

## 2015-10-18 ENCOUNTER — Other Ambulatory Visit: Payer: Self-pay | Admitting: Family Medicine

## 2015-10-20 NOTE — Telephone Encounter (Signed)
Medication filled to pharmacy as requested.   

## 2015-11-10 ENCOUNTER — Encounter: Payer: Self-pay | Admitting: Family Medicine

## 2015-11-10 MED ORDER — ALBUTEROL SULFATE HFA 108 (90 BASE) MCG/ACT IN AERS: 2.0000 | INHALATION_SPRAY | Freq: Four times a day (QID) | RESPIRATORY_TRACT | Status: DC | PRN

## 2015-11-14 ENCOUNTER — Ambulatory Visit (INDEPENDENT_AMBULATORY_CARE_PROVIDER_SITE_OTHER): Payer: PPO | Admitting: Family Medicine

## 2015-11-14 ENCOUNTER — Ambulatory Visit: Payer: PPO | Admitting: Adult Health

## 2015-11-14 ENCOUNTER — Encounter: Payer: Self-pay | Admitting: Family Medicine

## 2015-11-14 VITALS — BP 140/84 | HR 86 | Temp 98.0°F | Resp 17 | Wt 212.2 lb

## 2015-11-14 DIAGNOSIS — R058 Other specified cough: Secondary | ICD-10-CM

## 2015-11-14 DIAGNOSIS — R05 Cough: Secondary | ICD-10-CM | POA: Diagnosis not present

## 2015-11-14 DIAGNOSIS — R350 Frequency of micturition: Secondary | ICD-10-CM

## 2015-11-14 LAB — POCT URINALYSIS DIPSTICK
Bilirubin, UA: NEGATIVE
Glucose, UA: NEGATIVE
Ketones, UA: NEGATIVE
LEUKOCYTES UA: NEGATIVE
NITRITE UA: NEGATIVE
PH UA: 6
PROTEIN UA: NEGATIVE
RBC UA: NEGATIVE
Spec Grav, UA: 1.01
UROBILINOGEN UA: 0.2

## 2015-11-14 MED ORDER — PREDNISONE 10 MG PO TABS: ORAL_TABLET | ORAL | Status: DC

## 2015-11-14 NOTE — Patient Instructions (Signed)
Follow up as needed Start the Prednisone as directed for the airway inflammation and cough Continue Mucinex DM to thin your cough and congestion Use the albuterol inhaler for cough, wheezing, shortness of breath Switch the Allegra to Zyrtec for a few weeks and see if this makes a difference Call with any questions or concerns Enjoy your trip!

## 2015-11-14 NOTE — Progress Notes (Signed)
Pre visit review using our clinic review tool, if applicable. No additional management support is needed unless otherwise documented below in the visit note. 

## 2015-11-14 NOTE — Assessment & Plan Note (Signed)
New.  Suspect this is related to his known BPH and recent travel along w/ effects of OTC cough/cold meds.  UA WNL.  No need for abx.  Encouraged him to contact urology if sxs persist.  Pt expressed understanding and is in agreement w/ plan.

## 2015-11-14 NOTE — Progress Notes (Signed)
   Subjective:    Patient ID: Jerry Simpson, male    DOB: 07-10-1945, 70 y.o.   MRN: [id number]  HPI URI- pt sent MyChart message on 5/22 indicating he was struggling w/ cough and drainage.  Encouraged him to take OTC allergy meds, Flonase, and use his inhaler (prescription was sent to pharmacy).  Pt reports he continues to cough and have nasal congestion that is turning yellow as of yesterday.  Pt reports cough is 'squeaky' and productive- particularly at night.  'i'm so much better than I was but it's just an irritating cough'.  No fevers.  Denies sinus pain, pressure.  No HAs.    Urinary frequency- sxs started a few days ago.  He had difficulty [place] from the beach due to needing frequent stops.  Denies dysuria.  + hx of BPH.  + urgency.  Follows w/ urology.  Denies suprapubic or CVA tenderness.   Review of Systems For ROS see HPI     Objective:   Physical Exam  Constitutional: He appears well-developed and well-nourished. No distress.  HENT:  Head: Normocephalic and atraumatic.  Right Ear: Tympanic membrane normal.  Left Ear: Tympanic membrane normal.  Nose: No mucosal edema or rhinorrhea. Right sinus exhibits no maxillary sinus tenderness and no frontal sinus tenderness. Left sinus exhibits no maxillary sinus tenderness and no frontal sinus tenderness.  Mouth/Throat: Mucous membranes are normal. No oropharyngeal exudate, posterior oropharyngeal edema or posterior oropharyngeal erythema.  Eyes: Conjunctivae and EOM are normal. Pupils are equal, round, and reactive to light.  Neck: Normal range of motion. Neck supple.  Cardiovascular: Normal rate, regular rhythm and normal heart sounds.   Pulmonary/Chest: Effort normal and breath sounds normal. No respiratory distress. He has no wheezes.  + hacking cough  Lymphadenopathy:    He has no cervical adenopathy.  Skin: Skin is warm and dry.  Vitals reviewed.         Assessment & Plan:

## 2015-11-14 NOTE — Assessment & Plan Note (Signed)
Pt's current cough and absence of bacterial infxn on PE are consistent w/ a post-viral cough/allergy combo.  Encouraged him to continue his albuterol inhaler prn, switch allergy medication from Allegra to Zyrtec, and will start prednisone taper for airway inflammation.  Reviewed supportive care and red flags that should prompt return.  Pt expressed understanding and is in agreement w/ plan.

## 2015-12-09 DIAGNOSIS — R609 Edema, unspecified: Secondary | ICD-10-CM | POA: Diagnosis not present

## 2015-12-09 DIAGNOSIS — D303 Benign neoplasm of bladder: Secondary | ICD-10-CM | POA: Diagnosis not present

## 2015-12-09 DIAGNOSIS — R319 Hematuria, unspecified: Secondary | ICD-10-CM | POA: Diagnosis not present

## 2015-12-09 DIAGNOSIS — R9341 Abnormal radiologic findings on diagnostic imaging of renal pelvis, ureter, or bladder: Secondary | ICD-10-CM | POA: Diagnosis not present

## 2015-12-09 DIAGNOSIS — R339 Retention of urine, unspecified: Secondary | ICD-10-CM | POA: Diagnosis not present

## 2016-04-06 ENCOUNTER — Ambulatory Visit: Payer: PPO

## 2016-04-08 ENCOUNTER — Ambulatory Visit (INDEPENDENT_AMBULATORY_CARE_PROVIDER_SITE_OTHER): Payer: PPO

## 2016-04-08 DIAGNOSIS — Z23 Encounter for immunization: Secondary | ICD-10-CM

## 2016-05-04 ENCOUNTER — Other Ambulatory Visit (INDEPENDENT_AMBULATORY_CARE_PROVIDER_SITE_OTHER): Payer: PPO

## 2016-05-04 ENCOUNTER — Other Ambulatory Visit: Payer: Self-pay | Admitting: Family Medicine

## 2016-05-04 DIAGNOSIS — E785 Hyperlipidemia, unspecified: Secondary | ICD-10-CM

## 2016-05-04 LAB — LIPID PANEL
Cholesterol: 191 mg/dL (ref 0–200)
HDL: 29 mg/dL — AB (ref >39.00)
NONHDL: 161.81
TRIGLYCERIDES: 260 mg/dL — AB (ref 0.0–149.0)
Total CHOL/HDL Ratio: 7
VLDL: 52 mg/dL — AB (ref 0.0–40.0)

## 2016-05-04 LAB — BASIC METABOLIC PANEL
BUN: 16 mg/dL (ref 6–23)
CALCIUM: 9.5 mg/dL (ref 8.4–10.5)
CO2: 31 meq/L (ref 19–32)
Chloride: 102 mEq/L (ref 96–112)
Creatinine, Ser: 0.89 mg/dL (ref 0.40–1.50)
GFR: 90.24 mL/min (ref >60.00)
Glucose, Bld: 81 mg/dL (ref 70–99)
POTASSIUM: 4.3 meq/L (ref 3.5–5.1)
SODIUM: 140 meq/L (ref 135–145)

## 2016-05-04 LAB — HEPATIC FUNCTION PANEL
ALBUMIN: 4.4 g/dL (ref 3.5–5.2)
ALT: 21 U/L (ref 0–53)
AST: 19 U/L (ref 0–37)
Alkaline Phosphatase: 51 U/L (ref 39–117)
Bilirubin, Direct: 0.1 mg/dL (ref 0.0–0.3)
Total Bilirubin: 0.8 mg/dL (ref 0.2–1.2)
Total Protein: 7.1 g/dL (ref 6.0–8.3)

## 2016-05-04 LAB — LDL CHOLESTEROL, DIRECT: LDL DIRECT: 125 mg/dL

## 2016-05-05 ENCOUNTER — Other Ambulatory Visit: Payer: Self-pay | Admitting: General Practice

## 2016-05-05 MED ORDER — FENOFIBRATE 160 MG PO TABS: 160.0000 mg | ORAL_TABLET | Freq: Every day | ORAL | 6 refills | Status: DC

## 2016-05-10 ENCOUNTER — Other Ambulatory Visit: Payer: Self-pay | Admitting: Family Medicine

## 2016-05-10 NOTE — Telephone Encounter (Signed)
Medication filled to pharmacy as requested.   

## 2016-05-18 DIAGNOSIS — D225 Melanocytic nevi of trunk: Secondary | ICD-10-CM | POA: Diagnosis not present

## 2016-05-18 DIAGNOSIS — L820 Inflamed seborrheic keratosis: Secondary | ICD-10-CM | POA: Diagnosis not present

## 2016-05-18 DIAGNOSIS — X32XXXD Exposure to sunlight, subsequent encounter: Secondary | ICD-10-CM | POA: Diagnosis not present

## 2016-05-18 DIAGNOSIS — Z1283 Encounter for screening for malignant neoplasm of skin: Secondary | ICD-10-CM | POA: Diagnosis not present

## 2016-05-18 DIAGNOSIS — L570 Actinic keratosis: Secondary | ICD-10-CM | POA: Diagnosis not present

## 2016-06-24 ENCOUNTER — Ambulatory Visit (INDEPENDENT_AMBULATORY_CARE_PROVIDER_SITE_OTHER): Payer: PPO | Admitting: Physician Assistant

## 2016-06-24 ENCOUNTER — Encounter: Payer: Self-pay | Admitting: Physician Assistant

## 2016-06-24 VITALS — BP 126/84 | HR 78 | Temp 97.9°F | Resp 16 | Ht 74.0 in | Wt 222.0 lb

## 2016-06-24 DIAGNOSIS — J019 Acute sinusitis, unspecified: Secondary | ICD-10-CM | POA: Diagnosis not present

## 2016-06-24 DIAGNOSIS — B9689 Other specified bacterial agents as the cause of diseases classified elsewhere: Secondary | ICD-10-CM | POA: Diagnosis not present

## 2016-06-24 MED ORDER — AMOXICILLIN-POT CLAVULANATE 875-125 MG PO TABS: 1.0000 | ORAL_TABLET | Freq: Two times a day (BID) | ORAL | 0 refills | Status: DC

## 2016-06-24 NOTE — Progress Notes (Signed)
Patient presents to clinic today c/o > 1 week of sinus pressure, head congestion, with some sinus pain and PND. Initially clear drainage but now thick yellow,green and odorous. Denies fevers, chest congestion. Notes dry cough. Has been using saline nasal rinses and his Flonase and Allegra daily for allergy symptoms. Denies recent travel. Denies sick contact.   Past Medical History:  Diagnosis Date  . Adenomatous polyps   . Allergy   . Bladder tumor   . BPH (benign prostatic hyperplasia)    elevated PSA, Dr Jeffie Pollock  . CAD (coronary artery disease)   . Environmental allergies   . GERD (gastroesophageal reflux disease)   . Hyperlipidemia     Current Outpatient Prescriptions on File Prior to Visit  Medication Sig Dispense Refill  . albuterol (PROVENTIL HFA;VENTOLIN HFA) 108 (90 Base) MCG/ACT inhaler Inhale 2 puffs into the lungs every 6 (six) hours as needed for wheezing or shortness of breath. 1 Inhaler 0  . aspirin EC 81 MG tablet Take 81 mg by mouth daily.     Marland Kitchen atorvastatin (LIPITOR) 20 MG tablet TAKE 1 TABLET ONCE DAILY. 90 tablet 1  . Coenzyme Q10 (CO Q 10 PO) Take by mouth. Takes 300 mg daily    . fenofibrate 160 MG tablet Take 1 tablet (160 mg total) by mouth daily. 30 tablet 6  . fexofenadine (ALLEGRA) 180 MG tablet Take 90 mg by mouth daily.     . fluticasone (FLONASE) 50 MCG/ACT nasal spray Place 2 sprays into both nostrils daily. 16 g 1  . Multiple Vitamin (MULTIVITAMIN) tablet Take 1 tablet by mouth daily. Reported on 06/12/2015    . nystatin-triamcinolone (MYCOLOG II) cream Apply 1 application topically as needed.    . pantoprazole (PROTONIX) 40 MG tablet TAKE 1 TABLET ONCE DAILY. 90 tablet 1  . tamsulosin (FLOMAX) 0.4 MG CAPS capsule TAKE 1 CAPSULE DAILY. 90 capsule 1   No current facility-administered medications on file prior to visit.     Allergies  Allergen Reactions  . Sulfonamide Derivatives Rash    RASH  Because of a history of documented adverse serious drug  reaction;Medi Alert bracelet  is recommended  . Claritin [Loratadine]     Pt states it affects his prostate.     Family History  Problem Relation Age of Onset  . Colon cancer Neg Hx   . Stomach cancer Neg Hx   . Hypertension Father   . Heart attack Father 56  . Diabetes Father   . Cancer Brother     lung cancer ; leukemia terminally  . Heart attack Brother 9  . Heart disease Paternal Uncle 62  . Stroke Maternal Grandmother     >65    Social History   Social History  . Marital status: Married    Spouse name: N/A  . Number of children: N/A  . Years of education: N/A   Social History Main Topics  . Smoking status: Never Smoker  . Smokeless tobacco: Never Used  . Alcohol use 3.5 oz/week    7 drink(s) per week     Comment:  < 7/ week  . Drug use: No  . Sexual activity: Not Asked   Other Topics Concern  . None   Social History Narrative  . None    Review of Systems - See HPI.  All other ROS are negative.  BP 126/84   Pulse 78   Temp 97.9 F (36.6 C) (Oral)   Resp 16   Ht 6\' 2"  (  1.88 m)   Wt 222 lb (100.7 kg)   SpO2 96%   BMI 28.50 kg/m   Physical Exam  Constitutional: He is oriented to person, place, and time and well-developed, well-nourished, and in no distress.  HENT:  Head: Normocephalic and atraumatic.  Right Ear: Tympanic membrane normal.  Left Ear: Tympanic membrane normal.  Nose: Mucosal edema and rhinorrhea present.  Mouth/Throat: Uvula is midline, oropharynx is clear and moist and mucous membranes are normal.  Eyes: Conjunctivae are normal.  Cardiovascular: Normal rate, regular rhythm, normal heart sounds and intact distal pulses.   Pulmonary/Chest: Effort normal and breath sounds normal. No respiratory distress. He has no wheezes. He has no rales. He exhibits no tenderness.  Neurological: He is alert and oriented to person, place, and time.  Skin: Skin is warm and dry. No rash noted.  Psychiatric: Affect normal.  Vitals reviewed.  Recent  Results (from the past 2160 hour(s))  Basic metabolic panel     Status: None   Collection Time: 05/04/16 11:19 AM  Result Value Ref Range   Sodium 140 135 - 145 mEq/L   Potassium 4.3 3.5 - 5.1 mEq/L   Chloride 102 96 - 112 mEq/L   CO2 31 19 - 32 mEq/L   Glucose, Bld 81 70 - 99 mg/dL   BUN 16 6 - 23 mg/dL   Creatinine, Ser 0.89 0.40 - 1.50 mg/dL   Calcium 9.5 8.4 - 10.5 mg/dL   GFR 90.24 >60.00 mL/min  Lipid panel     Status: Abnormal   Collection Time: 05/04/16 11:19 AM  Result Value Ref Range   Cholesterol 191 0 - 200 mg/dL    Comment: ATP III Classification       Desirable:  < 200 mg/dL               Borderline High:  200 - 239 mg/dL          High:  > = 240 mg/dL   Triglycerides 260.0 (H) 0.0 - 149.0 mg/dL    Comment: Normal:  <150 mg/dLBorderline High:  150 - 199 mg/dL   HDL 29.00 (L) >39.00 mg/dL   VLDL 52.0 (H) 0.0 - 40.0 mg/dL   Total CHOL/HDL Ratio 7     Comment:                Men          Women1/2 Average Risk     3.4          3.3Average Risk          5.0          4.42X Average Risk          9.6          7.13X Average Risk          15.0          11.0                       NonHDL 161.81     Comment: NOTE:  Non-HDL goal should be 30 mg/dL higher than patient's LDL goal (i.e. LDL goal of < 70 mg/dL, would have non-HDL goal of < 100 mg/dL)  Hepatic function panel     Status: None   Collection Time: 05/04/16 11:19 AM  Result Value Ref Range   Total Bilirubin 0.8 0.2 - 1.2 mg/dL   Bilirubin, Direct 0.1 0.0 - 0.3 mg/dL   Alkaline Phosphatase 51 39 - 117  U/L   AST 19 0 - 37 U/L   ALT 21 0 - 53 U/L   Total Protein 7.1 6.0 - 8.3 g/dL   Albumin 4.4 3.5 - 5.2 g/dL  LDL cholesterol, direct     Status: None   Collection Time: 05/04/16 11:19 AM  Result Value Ref Range   Direct LDL 125.0 mg/dL    Comment: Optimal:  <100 mg/dLNear or Above Optimal:  100-129 mg/dLBorderline High:  130-159 mg/dLHigh:  160-189 mg/dLVery High:  >190 mg/dL    Assessment/Plan: 1. Acute bacterial  sinusitis Rx Augmentin. Supportive measures and OTC medications reviewed. Continue chronic allergy medications.  - amoxicillin-clavulanate (AUGMENTIN) 875-125 MG tablet; Take 1 tablet by mouth 2 (two) times daily.  Dispense: 14 tablet; Refill: 0   Leeanne Rio, [place]

## 2016-06-24 NOTE — Patient Instructions (Signed)
Please take antibiotic as directed.  Increase fluid intake.  Use Saline nasal spray.  Take a daily multivitamin. Continue allergy medications and Flonase.  Place a humidifier in the bedroom.  Please call or return clinic if symptoms are not improving.  Sinusitis Sinusitis is redness, soreness, and swelling (inflammation) of the paranasal sinuses. Paranasal sinuses are air pockets within the bones of your face (beneath the eyes, the middle of the forehead, or above the eyes). In healthy paranasal sinuses, mucus is able to drain out, and air is able to circulate through them by way of your nose. However, when your paranasal sinuses are inflamed, mucus and air can become trapped. This can allow bacteria and other germs to grow and cause infection. Sinusitis can develop quickly and last only a short time (acute) or continue over a long period (chronic). Sinusitis that lasts for more than 12 weeks is considered chronic.  CAUSES  Causes of sinusitis include:  Allergies.  Structural abnormalities, such as displacement of the cartilage that separates your nostrils (deviated septum), which can decrease the air flow through your nose and sinuses and affect sinus drainage.  Functional abnormalities, such as when the small hairs (cilia) that line your sinuses and help remove mucus do not work properly or are not present. SYMPTOMS  Symptoms of acute and chronic sinusitis are the same. The primary symptoms are pain and pressure around the affected sinuses. Other symptoms include:  Upper toothache.  Earache.  Headache.  Bad breath.  Decreased sense of smell and taste.  A cough, which worsens when you are lying flat.  Fatigue.  Fever.  Thick drainage from your nose, which often is green and may contain pus (purulent).  Swelling and warmth over the affected sinuses. DIAGNOSIS  Your caregiver will perform a physical exam. During the exam, your caregiver may:  Look in your nose for signs of  abnormal growths in your nostrils (nasal polyps).  Tap over the affected sinus to check for signs of infection.  View the inside of your sinuses (endoscopy) with a special imaging device with a light attached (endoscope), which is inserted into your sinuses. If your caregiver suspects that you have chronic sinusitis, one or more of the following tests may be recommended:  Allergy tests.  Nasal culture A sample of mucus is taken from your nose and sent to a lab and screened for bacteria.  Nasal cytology A sample of mucus is taken from your nose and examined by your caregiver to determine if your sinusitis is related to an allergy. TREATMENT  Most cases of acute sinusitis are related to a viral infection and will resolve on their own within 10 days. Sometimes medicines are prescribed to help relieve symptoms (pain medicine, decongestants, nasal steroid sprays, or saline sprays).  However, for sinusitis related to a bacterial infection, your caregiver will prescribe antibiotic medicines. These are medicines that will help kill the bacteria causing the infection.  Rarely, sinusitis is caused by a fungal infection. In theses cases, your caregiver will prescribe antifungal medicine. For some cases of chronic sinusitis, surgery is needed. Generally, these are cases in which sinusitis recurs more than 3 times per year, despite other treatments. HOME CARE INSTRUCTIONS   Drink plenty of water. Water helps thin the mucus so your sinuses can drain more easily.  Use a humidifier.  Inhale steam 3 to 4 times a day (for example, sit in the bathroom with the shower running).  Apply a warm, moist washcloth to your face 3  to 4 times a day, or as directed by your caregiver.  Use saline nasal sprays to help moisten and clean your sinuses.  Take over-the-counter or prescription medicines for pain, discomfort, or fever only as directed by your caregiver. SEEK IMMEDIATE MEDICAL CARE IF:  You have increasing  pain or severe headaches.  You have nausea, vomiting, or drowsiness.  You have swelling around your face.  You have vision problems.  You have a stiff neck.  You have difficulty breathing. MAKE SURE YOU:   Understand these instructions.  Will watch your condition.  Will get help right away if you are not doing well or get worse. Document Released: 06/07/2005 Document Revised: 08/30/2011 Document Reviewed: 06/22/2011 Exit[employer] Patient Information 2014 [employer].   

## 2016-06-24 NOTE — Progress Notes (Signed)
Pre visit review using our clinic review tool, if applicable. No additional management support is needed unless otherwise documented below in the visit note. 

## 2016-09-15 ENCOUNTER — Encounter: Payer: Self-pay | Admitting: Internal Medicine

## 2016-09-25 ENCOUNTER — Other Ambulatory Visit: Payer: Self-pay | Admitting: Family Medicine

## 2016-10-04 DIAGNOSIS — H26492 Other secondary cataract, left eye: Secondary | ICD-10-CM | POA: Diagnosis not present

## 2016-10-04 DIAGNOSIS — H52203 Unspecified astigmatism, bilateral: Secondary | ICD-10-CM | POA: Diagnosis not present

## 2016-10-04 DIAGNOSIS — H35372 Puckering of macula, left eye: Secondary | ICD-10-CM | POA: Diagnosis not present

## 2016-10-04 DIAGNOSIS — H43813 Vitreous degeneration, bilateral: Secondary | ICD-10-CM | POA: Diagnosis not present

## 2016-10-22 ENCOUNTER — Other Ambulatory Visit: Payer: Self-pay | Admitting: Family Medicine

## 2016-10-31 ENCOUNTER — Other Ambulatory Visit: Payer: Self-pay | Admitting: Family Medicine

## 2016-11-11 ENCOUNTER — Telehealth: Payer: Self-pay

## 2016-11-11 NOTE — Telephone Encounter (Signed)
Patient called reporting gross hematuria x 10-12 hours. Patient states he has experienced this in the past, so he is not worried. He did not take his baby ASA this morning and is wanting other recommendations since he is out of town. Per Dr. Birdie Riddle, advised patient to go to a local urgent care for evaluation. Patient verbalized understanding, but states that is not an option so he would just rest and drink extra fluids. Encouraged patient again to be evaluated.

## 2016-11-12 NOTE — Telephone Encounter (Signed)
Agree w/ advice given.  Unfortunately, I cannot make him get evaluated

## 2016-11-20 ENCOUNTER — Other Ambulatory Visit: Payer: Self-pay | Admitting: Family Medicine

## 2016-12-02 ENCOUNTER — Telehealth: Payer: Self-pay | Admitting: Family Medicine

## 2016-12-02 ENCOUNTER — Other Ambulatory Visit: Payer: PPO

## 2016-12-02 ENCOUNTER — Other Ambulatory Visit: Payer: Self-pay | Admitting: *Deleted

## 2016-12-02 DIAGNOSIS — R399 Unspecified symptoms and signs involving the genitourinary system: Secondary | ICD-10-CM

## 2016-12-02 LAB — MICROALBUMIN / CREATININE URINE RATIO
Creatinine,U: 154.2 mg/dL
MICROALB UR: 17.8 mg/dL — AB (ref 0.0–1.9)
Microalb Creat Ratio: 11.5 mg/g (ref 0.0–30.0)

## 2016-12-02 NOTE — Telephone Encounter (Signed)
Pt called back to check status on this, advise pt of KT recommendations and that we will call him once ths order has been placed

## 2016-12-02 NOTE — Telephone Encounter (Signed)
He can leave a urine at [facility] (please place order) but given his very complicated urologic situation, would recommend he call his urologist rather than just leaving a urine.

## 2016-12-02 NOTE — Progress Notes (Signed)
urin

## 2016-12-02 NOTE — Telephone Encounter (Signed)
Orders have been placed, patient is aware

## 2016-12-02 NOTE — Telephone Encounter (Signed)
Pt state that he believes he may have a uti and would like to go to our [place] lab location to leave a sample there. Pt declined appt and states that he can wait for results, advise pt that KT was out of the office and that it may be Monday before a Rx could be called in without an appt.. Pt understood.

## 2016-12-03 LAB — URINE CULTURE: Organism ID, Bacteria: NO GROWTH

## 2016-12-14 NOTE — Progress Notes (Addendum)
Subjective:   Jerry Simpson is a 71 y.o. male who presents for Medicare Annual/Subsequent preventive examination.  Review of Systems:  No ROS.  Medicare Wellness Visit. Additional risk factors are reflected in the social history.  Cardiac Risk Factors include: advanced age (>37men, >23 women);dyslipidemia;family history of premature cardiovascular disease;male gender   Sleep patterns: Sleeps 7 hours, feels rested.  Home Safety/Smoke Alarms: Feels safe in home. Smoke alarms in place.  Living environment; residence and Firearm Safety: Lives with wife and adult daughter in 3 story home, rails at steps.  Seat Belt Safety/Bike Helmet: Wears seat belt.   Counseling:   Eye Exam-Last exam 2018, yearly. Dr. Norlene Duel exam 2018, every 6 months. Dr. Jetty Duhamel  Male:   [facility]-Colonoscopy 10/27/2011, polyps. Recall 5 years. Will call to schedule.  PSA- Followed by Urology Lab Results  Component Value Date   PSA 4.56 (H) 06/27/2012   PSA 1.05 07/26/2011   PSA 1.24 07/01/2010       Objective:    Vitals: BP 136/72 (BP Location: Left Arm, Patient Position: Sitting, Cuff Size: Normal)   Pulse 65   Ht 6\' 2"  (1.88 m)   Wt 206 lb (93.4 kg)   SpO2 97%   BMI 26.45 kg/m   Body mass index is 26.45 kg/m.  Tobacco History  Smoking Status  . Never Smoker  Smokeless Tobacco  . Never Used     Counseling given: No   Past Medical History:  Diagnosis Date  . Adenomatous polyps   . Allergy   . Bladder tumor   . BPH (benign prostatic hyperplasia)    elevated PSA, Dr Jeffie Pollock  . CAD (coronary artery disease)   . Environmental allergies   . GERD (gastroesophageal reflux disease)   . Hyperlipidemia    Past Surgical History:  Procedure Laterality Date  . ANGIOPLASTY  2004   stent placement ;sees Dr Ron Parker  . CATARACT EXTRACTION, BILATERAL  2012  . colonoscopy with polypectomy     X2, Dr Henrene Pastor  . TONSILLECTOMY  1962   Family History  Problem Relation Age of Onset  .  Hypertension Father   . Heart attack Father 49  . Diabetes Father   . Cancer Brother        lung cancer ; leukemia terminally  . Heart attack Brother 61  . Heart disease Paternal Uncle 5  . Stroke Maternal Grandmother        >65  . Colon cancer Neg Hx   . Stomach cancer Neg Hx    History  Sexual Activity  . Sexual activity: Not on file    Outpatient Encounter Prescriptions as of 12/15/2016  Medication Sig  . aspirin EC 81 MG tablet Take 81 mg by mouth daily.   Marland Kitchen atorvastatin (LIPITOR) 20 MG tablet TAKE 1 TABLET ONCE DAILY. (Patient taking differently: every other day)  . Cyanocobalamin (B-12 PO) Take by mouth.  . fexofenadine (ALLEGRA) 180 MG tablet Take 90 mg by mouth daily.   . fluticasone (FLONASE) 50 MCG/ACT nasal spray Place 2 sprays into both nostrils daily.  . Multiple Vitamin (MULTIVITAMIN) tablet Take 1 tablet by mouth daily. Reported on 06/12/2015  . nystatin-triamcinolone (MYCOLOG II) cream Apply 1 application topically as needed.  . pantoprazole (PROTONIX) 40 MG tablet TAKE 1 TABLET ONCE DAILY. (Patient taking differently: prn)  . tamsulosin (FLOMAX) 0.4 MG CAPS capsule TAKE 1 CAPSULE DAILY.  Marland Kitchen TURMERIC CURCUMIN PO Take 2 tablets by mouth daily.  Marland Kitchen albuterol (PROVENTIL HFA;VENTOLIN HFA)  108 (90 Base) MCG/ACT inhaler Inhale 2 puffs into the lungs every 6 (six) hours as needed for wheezing or shortness of breath. (Patient not taking: Reported on 12/15/2016)  . amoxicillin-clavulanate (AUGMENTIN) 875-125 MG tablet Take 1 tablet by mouth 2 (two) times daily. (Patient not taking: Reported on 12/15/2016)  . Coenzyme Q10 (CO Q 10 PO) Take by mouth. Takes 300 mg daily  . fenofibrate 160 MG tablet Take 1 tablet (160 mg total) by mouth daily. (Patient not taking: Reported on 12/15/2016)   No facility-administered encounter medications on file as of 12/15/2016.     Activities of Daily Living In your present state of health, do you have any difficulty performing the following  activities: 12/15/2016  Hearing? N  Vision? N  Difficulty concentrating or making decisions? N  Walking or climbing stairs? N  Dressing or bathing? N  Doing errands, shopping? N  Preparing Food and eating ? N  Using the Toilet? N  In the past six months, have you accidently leaked urine? N  Do you have problems with loss of bowel control? N  Managing your Medications? N  Managing your Finances? N  Housekeeping or managing your Housekeeping? N  Some recent data might be hidden    Patient Care Team: Midge Minium, MD as PCP - General (Family Medicine) Irene Shipper, MD as Consulting Physician (Gastroenterology) Beryle Lathe, MD as Referring Physician (General Surgery)   Assessment:    Physical assessment deferred to PCP.  Exercise Activities and Dietary recommendations Current Exercise Habits: Home exercise routine (yard work; golf), Type of exercise: walking, Time (Minutes): 30, Frequency (Times/Week): 3, Weekly Exercise (Minutes/Week): 90, Exercise limited by: None identified   Diet (meal preparation, eat out, water intake, caffeinated beverages, dairy products, fruits and vegetables): Drinks water and "tommy tonic (vodka, OJ, diet seltzer)"  Breakfast: eggs, toast, coffee Lunch: deli meat, pickle, nuts Dinner: Salad, protein  (chicken, salmon)  Patient currently following Weight Watchers plan and getting back into gym.   Goals    . Weight (lb) < 198 lb (89.8 kg)          Lose belly weight by increasing exercises.       Fall Risk Fall Risk  12/15/2016 08/18/2015 07/09/2014 06/29/2013  Falls in the past year? No No No No   Depression Screen PHQ 2/9 Scores 12/15/2016 08/18/2015 07/09/2014 06/29/2013  PHQ - 2 Score 0 0 0 0    Cognitive Function       Ad8 score reviewed for issues:  Issues making decisions: no  Less interest in hobbies / activities: no  Repeats questions, stories (family complaining): no  Trouble using ordinary gadgets (microwave, computer,  phone): no  Forgets the month or year: no  Mismanaging finances: no  Remembering appts: no  Daily problems with thinking and/or memory: no Ad8 score is=0     Immunization History  Administered Date(s) Administered  . Influenza Split 03/30/2011, 04/10/2012  . Influenza Whole 04/25/2007, 04/03/2009, 06/05/2010  . Influenza, High Dose Seasonal PF 05/08/2013, 04/02/2015, 04/08/2016  . Influenza,inj,Quad PF,36+ Mos 04/18/2014  . Pneumococcal Conjugate-13 07/04/2013  . Pneumococcal Polysaccharide-23 07/09/2014  . Tdap 06/30/2012  . Zoster 03/30/2011   Screening Tests Health Maintenance  Topic Date Due  . Hepatitis C Screening  1946/06/01  . COLONOSCOPY  10/26/2016  . INFLUENZA VACCINE  01/19/2017  . TETANUS/TDAP  06/30/2022  . PNA vac Low Risk Adult  Completed  Postponing Hep C Screening for CPE labs. Scheduling colonoscopy.  Plan:    Bring a copy of your advance directives to your next office visit.  Continue doing brain stimulating activities (puzzles, reading, adult coloring books, staying active) to keep memory sharp.   Schedule colonoscopy  I have personally reviewed and noted the following in the patient's chart:   . Medical and social history . Use of alcohol, tobacco or illicit drugs  . Current medications and supplements . Functional ability and status . Nutritional status . Physical activity . Advanced directives . List of other physicians . Hospitalizations, surgeries, and ER visits in previous 12 months . Vitals . Screenings to include cognitive, depression, and falls . Referrals and appointments  In addition, I have reviewed and discussed with patient certain preventive protocols, quality metrics, and best practice recommendations. A written personalized care plan for preventive services as well as general preventive health recommendations were provided to patient.     Gerilyn Nestle, RN  12/15/2016  PCP Notes: -Scheduling  colonoscopy -Postpone Hep C to do with CPE labs -Will be looking for local Urology (does not like [facility])  Reviewed documentation and agree w/ above.  Annye Asa, MD

## 2016-12-15 ENCOUNTER — Ambulatory Visit (INDEPENDENT_AMBULATORY_CARE_PROVIDER_SITE_OTHER): Payer: PPO

## 2016-12-15 VITALS — BP 136/72 | HR 65 | Ht 74.0 in | Wt 206.0 lb

## 2016-12-15 DIAGNOSIS — Z0000 Encounter for general adult medical examination without abnormal findings: Secondary | ICD-10-CM | POA: Diagnosis not present

## 2016-12-15 NOTE — Patient Instructions (Addendum)
Bring a copy of your advance directives to your next office visit.  Continue doing brain stimulating activities (puzzles, reading, adult coloring books, staying active) to keep memory sharp.   Schedule colonoscopy   Fall Prevention in the Home Falls can cause injuries. They can happen to people of all ages. There are many things you can do to make your home safe and to help prevent falls. What can I do on the outside of my home?  Regularly fix the edges of walkways and driveways and fix any cracks.  Remove anything that might make you trip as you walk through a door, such as a raised step or threshold.  Trim any bushes or trees on the path to your home.  Use bright outdoor lighting.  Clear any walking paths of anything that might make someone trip, such as rocks or tools.  Regularly check to see if handrails are loose or broken. Make sure that both sides of any steps have handrails.  Any raised decks and porches should have guardrails on the edges.  Have any leaves, snow, or ice cleared regularly.  Use sand or salt on walking paths during winter.  Clean up any spills in your garage right away. This includes oil or grease spills. What can I do in the bathroom?  Use night lights.  Install grab bars by the toilet and in the tub and shower. Do not use towel bars as grab bars.  Use non-skid mats or decals in the tub or shower.  If you need to sit down in the shower, use a plastic, non-slip stool.  Keep the floor dry. Clean up any water that spills on the floor as soon as it happens.  Remove soap buildup in the tub or shower regularly.  Attach bath mats securely with double-sided non-slip rug tape.  Do not have throw rugs and other things on the floor that can make you trip. What can I do in the bedroom?  Use night lights.  Make sure that you have a light by your bed that is easy to reach.  Do not use any sheets or blankets that are too big for your bed. They should not  hang down onto the floor.  Have a firm chair that has side arms. You can use this for support while you get dressed.  Do not have throw rugs and other things on the floor that can make you trip. What can I do in the kitchen?  Clean up any spills right away.  Avoid walking on wet floors.  Keep items that you use a lot in easy-to-reach places.  If you need to reach something above you, use a strong step stool that has a grab bar.  Keep electrical cords out of the way.  Do not use floor polish or wax that makes floors slippery. If you must use wax, use non-skid floor wax.  Do not have throw rugs and other things on the floor that can make you trip. What can I do with my stairs?  Do not leave any items on the stairs.  Make sure that there are handrails on both sides of the stairs and use them. Fix handrails that are broken or loose. Make sure that handrails are as long as the stairways.  Check any carpeting to make sure that it is firmly attached to the stairs. Fix any carpet that is loose or worn.  Avoid having throw rugs at the top or bottom of the stairs. If you do  have throw rugs, attach them to the floor with carpet tape.  Make sure that you have a light switch at the top of the stairs and the bottom of the stairs. If you do not have them, ask someone to add them for you. What else can I do to help prevent falls?  Wear shoes that: ? Do not have high heels. ? Have rubber bottoms. ? Are comfortable and fit you well. ? Are closed at the toe. Do not wear sandals.  If you use a stepladder: ? Make sure that it is fully opened. Do not climb a closed stepladder. ? Make sure that both sides of the stepladder are locked into place. ? Ask someone to hold it for you, if possible.  Clearly mark and make sure that you can see: ? Any grab bars or handrails. ? First and last steps. ? Where the edge of each step is.  Use tools that help you move around (mobility aids) if they are  needed. These include: ? Canes. ? Walkers. ? Scooters. ? Crutches.  Turn on the lights when you go into a dark area. Replace any light bulbs as soon as they burn out.  Set up your furniture so you have a clear path. Avoid moving your furniture around.  If any of your floors are uneven, fix them.  If there are any pets around you, be aware of where they are.  Review your medicines with your doctor. Some medicines can make you feel dizzy. This can increase your chance of falling. Ask your doctor what other things that you can do to help prevent falls. This information is not intended to replace advice given to you by your health care provider. Make sure you discuss any questions you have with your health care provider. Document Released: 04/03/2009 Document Revised: 11/13/2015 Document Reviewed: 07/12/2014 Elsevier Interactive Patient Education  2018 [place] Maintenance, Male A healthy lifestyle and preventive care is important for your health and wellness. Ask your health care provider about what schedule of regular examinations is right for you. What should I know about weight and diet? Eat a Healthy Diet  Eat plenty of vegetables, fruits, whole grains, low-fat dairy products, and lean protein.  Do not eat a lot of foods high in solid fats, added sugars, or salt.  Maintain a Healthy Weight Regular exercise can help you achieve or maintain a healthy weight. You should:  Do at least 150 minutes of exercise each week. The exercise should increase your heart rate and make you sweat (moderate-intensity exercise).  Do strength-training exercises at least twice a week.  Watch Your Levels of Cholesterol and Blood Lipids  Have your blood tested for lipids and cholesterol every 5 years starting at 69 years of age. If you are at high risk for heart disease, you should start having your blood tested when you are 69 years old. You may need to have your cholesterol levels  checked more often if: ? Your lipid or cholesterol levels are high. ? You are older than 69 years of age. ? You are at high risk for heart disease.  What should I know about cancer screening? Many types of cancers can be detected early and may often be prevented. Lung Cancer  You should be screened every year for lung cancer if: ? You are a current smoker who has smoked for at least 30 years. ? You are a former smoker who has quit within the past 15 years.  Talk to your health care provider about your screening options, when you should start screening, and how often you should be screened.  Colorectal Cancer  Routine colorectal cancer screening usually begins at 69 years of age and should be repeated every 5-10 years until you are 69 years old. You may need to be screened more often if early forms of precancerous polyps or small growths are found. Your health care provider may recommend screening at an earlier age if you have risk factors for colon cancer.  Your health care provider may recommend using home test kits to check for hidden blood in the stool.  A small camera at the end of a tube can be used to examine your colon (sigmoidoscopy or colonoscopy). This checks for the earliest forms of colorectal cancer.  Prostate and Testicular Cancer  Depending on your age and overall health, your health care provider may do certain tests to screen for prostate and testicular cancer.  Talk to your health care provider about any symptoms or concerns you have about testicular or prostate cancer.  Skin Cancer  Check your skin from head to toe regularly.  Tell your health care provider about any new moles or changes in moles, especially if: ? There is a change in a mole's size, shape, or color. ? You have a mole that is larger than a pencil eraser.  Always use sunscreen. Apply sunscreen liberally and repeat throughout the day.  Protect yourself by wearing long sleeves, pants, a  wide-brimmed hat, and sunglasses when outside.  What should I know about heart disease, diabetes, and high blood pressure?  If you are 57-78 years of age, have your blood pressure checked every 3-5 years. If you are 46 years of age or older, have your blood pressure checked every year. You should have your blood pressure measured twice-once when you are at a hospital or clinic, and once when you are not at a hospital or clinic. Record the average of the two measurements. To check your blood pressure when you are not at a hospital or clinic, you can use: ? An automated blood pressure machine at a pharmacy. ? A home blood pressure monitor.  Talk to your health care provider about your target blood pressure.  If you are between 76-58 years old, ask your health care provider if you should take aspirin to prevent heart disease.  Have regular diabetes screenings by checking your fasting blood sugar level. ? If you are at a normal weight and have a low risk for diabetes, have this test once every three years after the age of 11. ? If you are overweight and have a high risk for diabetes, consider being tested at a younger age or more often.  A one-time screening for abdominal aortic aneurysm (AAA) by ultrasound is recommended for men aged 25-75 years who are current or former smokers. What should I know about preventing infection? Hepatitis B If you have a higher risk for hepatitis B, you should be screened for this virus. Talk with your health care provider to find out if you are at risk for hepatitis B infection. Hepatitis C Blood testing is recommended for:  Everyone born from [birth year] through 1963.  Anyone with known risk factors for hepatitis C.  Sexually Transmitted Diseases (STDs)  You should be screened each year for STDs including gonorrhea and chlamydia if: ? You are sexually active and are younger than 69 years of age. ? You are older than 69 years of age  and your health care provider  tells you that you are at risk for this type of infection. ? Your sexual activity has changed since you were last screened and you are at an increased risk for chlamydia or gonorrhea. Ask your health care provider if you are at risk.  Talk with your health care provider about whether you are at high risk of being infected with HIV. Your health care provider may recommend a prescription medicine to help prevent HIV infection.  What else can I do?  Schedule regular health, dental, and eye exams.  Stay current with your vaccines (immunizations).  Do not use any tobacco products, such as cigarettes, chewing tobacco, and e-cigarettes. If you need help quitting, ask your health care provider.  Limit alcohol intake to no more than 2 drinks per day. One drink equals 12 ounces of beer, 5 ounces of wine, or 1 ounces of hard liquor.  Do not use street drugs.  Do not share needles.  Ask your health care provider for help if you need support or information about quitting drugs.  Tell your health care provider if you often feel depressed.  Tell your health care provider if you have ever been abused or do not feel safe at home. This information is not intended to replace advice given to you by your health care provider. Make sure you discuss any questions you have with your health care provider. Document Released: 12/04/2007 Document Revised: 02/04/2016 Document Reviewed: 03/11/2015 Elsevier Interactive Patient Education  [employer].

## 2017-02-15 ENCOUNTER — Other Ambulatory Visit: Payer: Self-pay | Admitting: Family Medicine

## 2017-03-09 ENCOUNTER — Telehealth: Payer: Self-pay | Admitting: *Deleted

## 2017-03-09 DIAGNOSIS — Z125 Encounter for screening for malignant neoplasm of prostate: Secondary | ICD-10-CM

## 2017-03-09 DIAGNOSIS — E785 Hyperlipidemia, unspecified: Secondary | ICD-10-CM

## 2017-03-09 NOTE — Telephone Encounter (Signed)
Patient states that he has not had it checked in a while, and he does not have an appointment with Urology because he is not having issues.  He states that he wants this checked when they do his bloodwork and if he has to follow-up with Urology after that he will.

## 2017-03-09 NOTE — Telephone Encounter (Signed)
Ok for CBC w/ diff, BMP, LFTs, lipid panel and TSH dx- hyperlipidemia

## 2017-03-09 NOTE — Telephone Encounter (Signed)
Patient calling requesting lab orders be put in before he comes in for his appointment on Monday.  Pt. States that this is always done for him and he would like the orders put in asap so that he can go to the [facility] lab.     Routed to provider to advise.

## 2017-03-09 NOTE — Telephone Encounter (Signed)
Patient notified that labs are all ordered and he can go have them drawn.  Pt. States that he will be going to the [facility] office for bloodwork.

## 2017-03-09 NOTE — Telephone Encounter (Signed)
Ok to add medicare PSA- dx prostate cancer screen

## 2017-03-09 NOTE — Telephone Encounter (Signed)
Pt sees urology- typically they are responsible for this

## 2017-03-09 NOTE — Telephone Encounter (Signed)
Patient requesting PSA as well.  Okay to order?

## 2017-03-10 ENCOUNTER — Other Ambulatory Visit: Payer: Self-pay | Admitting: *Deleted

## 2017-03-10 ENCOUNTER — Other Ambulatory Visit: Payer: Self-pay | Admitting: Family Medicine

## 2017-03-10 ENCOUNTER — Other Ambulatory Visit (INDEPENDENT_AMBULATORY_CARE_PROVIDER_SITE_OTHER): Payer: PPO

## 2017-03-10 DIAGNOSIS — Z125 Encounter for screening for malignant neoplasm of prostate: Secondary | ICD-10-CM

## 2017-03-10 DIAGNOSIS — E785 Hyperlipidemia, unspecified: Secondary | ICD-10-CM | POA: Diagnosis not present

## 2017-03-10 DIAGNOSIS — R972 Elevated prostate specific antigen [PSA]: Secondary | ICD-10-CM

## 2017-03-10 LAB — CBC WITH DIFFERENTIAL/PLATELET
BASOS ABS: 0.1 10*3/uL (ref 0.0–0.1)
BASOS PCT: 1.1 % (ref 0.0–3.0)
EOS ABS: 0.1 10*3/uL (ref 0.0–0.7)
Eosinophils Relative: 2 % (ref 0.0–5.0)
HCT: 49.2 % (ref 39.0–52.0)
Hemoglobin: 16.4 g/dL (ref 13.0–17.0)
LYMPHS ABS: 1.9 10*3/uL (ref 0.7–4.0)
Lymphocytes Relative: 30.1 % (ref 12.0–46.0)
MCHC: 33.3 g/dL (ref 30.0–36.0)
MCV: 89.6 fl (ref 78.0–100.0)
MONO ABS: 0.6 10*3/uL (ref 0.1–1.0)
Monocytes Relative: 8.9 % (ref 3.0–12.0)
NEUTROS PCT: 57.9 % (ref 43.0–77.0)
Neutro Abs: 3.6 10*3/uL (ref 1.4–7.7)
PLATELETS: 233 10*3/uL (ref 150.0–400.0)
RBC: 5.49 Mil/uL (ref 4.22–5.81)
RDW: 13.4 % (ref 11.5–15.5)
WBC: 6.2 10*3/uL (ref 4.0–10.5)

## 2017-03-10 LAB — LIPID PANEL
CHOLESTEROL: 213 mg/dL — AB (ref 0–200)
HDL: 35.8 mg/dL — AB (ref >39.00)
LDL CALC: 147 mg/dL — AB (ref 0–99)
NonHDL: 176.88
Total CHOL/HDL Ratio: 6
Triglycerides: 148 mg/dL (ref 0.0–149.0)
VLDL: 29.6 mg/dL (ref 0.0–40.0)

## 2017-03-10 LAB — HEPATIC FUNCTION PANEL
ALBUMIN: 4.6 g/dL (ref 3.5–5.2)
ALK PHOS: 42 U/L (ref 39–117)
ALT: 16 U/L (ref 0–53)
AST: 17 U/L (ref 0–37)
Bilirubin, Direct: 0.1 mg/dL (ref 0.0–0.3)
Total Bilirubin: 0.8 mg/dL (ref 0.2–1.2)
Total Protein: 7.5 g/dL (ref 6.0–8.3)

## 2017-03-10 LAB — PSA, MEDICARE: PSA: 5.83 ng/ml — ABNORMAL HIGH (ref 0.10–4.00)

## 2017-03-10 LAB — BASIC METABOLIC PANEL
BUN: 24 mg/dL — ABNORMAL HIGH (ref 6–23)
CHLORIDE: 101 meq/L (ref 96–112)
CO2: 31 mEq/L (ref 19–32)
Calcium: 9.8 mg/dL (ref 8.4–10.5)
Creatinine, Ser: 1.06 mg/dL (ref 0.40–1.50)
GFR: 73.57 mL/min (ref >60.00)
Glucose, Bld: 104 mg/dL — ABNORMAL HIGH (ref 70–99)
Potassium: 4.5 mEq/L (ref 3.5–5.1)
SODIUM: 137 meq/L (ref 135–145)

## 2017-03-10 LAB — TSH: TSH: 2.01 u[IU]/mL (ref 0.35–4.50)

## 2017-03-14 ENCOUNTER — Encounter: Payer: Self-pay | Admitting: Family Medicine

## 2017-03-14 ENCOUNTER — Ambulatory Visit (INDEPENDENT_AMBULATORY_CARE_PROVIDER_SITE_OTHER): Payer: PPO | Admitting: Family Medicine

## 2017-03-14 VITALS — BP 122/68 | HR 72 | Temp 98.1°F | Resp 16 | Ht 74.0 in | Wt 209.0 lb

## 2017-03-14 DIAGNOSIS — Z0000 Encounter for general adult medical examination without abnormal findings: Secondary | ICD-10-CM

## 2017-03-14 DIAGNOSIS — Z23 Encounter for immunization: Secondary | ICD-10-CM

## 2017-03-14 MED ORDER — ZOSTER VAC RECOMB ADJUVANTED 50 MCG/0.5ML IM SUSR: 0.5000 mL | Freq: Once | INTRAMUSCULAR | 1 refills | Status: AC

## 2017-03-14 NOTE — Progress Notes (Addendum)
   Subjective:    Patient ID: Jerry Simpson, male    DOB: 11/07/1945, 71 y.o.   MRN: [id number]  HPI CPE- pt here today for CPE.  Had his labs drawn last week that showed elevated PSA- referral placed back to [facility].  Cholesterol is again elevated as pt has stopped his Lipitor and Fenofibrate almost 1 yr ago.  Pt is attempting to eat better and exercise more- he is not interested in statin at this time.  Due for recall colonoscopy- pt plans to schedule.   Review of Systems Patient reports no vision/hearing changes, anorexia, fever ,adenopathy, persistant/recurrent hoarseness, swallowing issues, chest pain, palpitations, edema, persistant/recurrent cough, hemoptysis, dyspnea (rest,exertional, paroxysmal nocturnal), gastrointestinal  bleeding (melena, rectal bleeding), abdominal pain, excessive heart burn, GU symptoms (dysuria, hematuria, voiding/incontinence issues) syncope, focal weakness, memory loss, numbness & tingling, skin/hair/nail changes, depression, anxiety, abnormal bruising/bleeding, musculoskeletal symptoms/signs.     Objective:   Physical Exam General Appearance:    Alert, cooperative, no distress, appears stated age  Head:    Normocephalic, without obvious abnormality, atraumatic  Eyes:    PERRL, conjunctiva/corneas clear, EOM's intact, fundi    benign, both eyes       Ears:    Normal TM's and external ear canals, both ears  Nose:   Nares normal, septum midline, mucosa normal, no drainage   or sinus tenderness  Throat:   Lips, mucosa, and tongue normal; teeth and gums normal  Neck:   Supple, symmetrical, trachea midline, no adenopathy;       thyroid:  No enlargement/tenderness/nodules  Back:     Symmetric, no curvature, ROM normal, no CVA tenderness  Lungs:     Clear to auscultation bilaterally, respirations unlabored  Chest wall:    No tenderness or deformity  Heart:    Regular rate and rhythm, S1 and S2 normal, no murmur, rub   or gallop  Abdomen:     Soft,  non-tender, bowel sounds active all four quadrants,    no masses, no organomegaly  Genitalia:    Deferred to urology  Rectal:    Extremities:   Extremities normal, atraumatic, no cyanosis or edema  Pulses:   2+ and symmetric all extremities  Skin:   Skin color, texture, turgor normal, no rashes or lesions  Lymph nodes:   Cervical, supraclavicular, and axillary nodes normal  Neurologic:   CNII-XII intact. Normal strength, sensation and reflexes      throughout          Assessment & Plan:

## 2017-03-14 NOTE — Patient Instructions (Signed)
Follow up in 6 months to recheck cholesterol Please schedule your Annual Wellness w/ Kim at your convenience Continue to work on healthy diet and regular exercise- you can do it! Add OTC Red Yeast Rice to help improve LDL cholesterol Please call and schedule your colonoscopy Have the urologist send me a copy of their notes so I can scan them in your chart Get your shingles shot at the pharmacy Call with any questions or concerns Happy Fall!!

## 2017-03-14 NOTE — Progress Notes (Signed)
Pre visit review using our clinic review tool, if applicable. No additional management support is needed unless otherwise documented below in the visit note. 

## 2017-03-14 NOTE — Assessment & Plan Note (Signed)
Pt's PE WNL.  Due for repeat colonoscopy this year.  Reviewed recent labs- WNL w/ exception of cholesterol and PSA.  Pt prefers to go to [facility] rather than [facility].  He states he already has an appt.  Pt will start Red Yeast Rice rather than statin.  Flu shot given.  Anticipatory guidance provided.

## 2017-03-17 ENCOUNTER — Other Ambulatory Visit: Payer: Self-pay | Admitting: Family Medicine

## 2017-03-28 ENCOUNTER — Encounter: Payer: Self-pay | Admitting: Internal Medicine

## 2017-05-16 ENCOUNTER — Other Ambulatory Visit: Payer: Self-pay | Admitting: Family Medicine

## 2017-05-19 ENCOUNTER — Other Ambulatory Visit: Payer: Self-pay

## 2017-05-19 ENCOUNTER — Ambulatory Visit (AMBULATORY_SURGERY_CENTER): Payer: Self-pay | Admitting: *Deleted

## 2017-05-19 VITALS — Ht 74.0 in | Wt 215.2 lb

## 2017-05-19 DIAGNOSIS — Z86010 Personal history of colonic polyps: Secondary | ICD-10-CM

## 2017-05-19 MED ORDER — NA SULFATE-K SULFATE-MG SULF 17.5-3.13-1.6 GM/177ML PO SOLN
1.0000 [IU] | Freq: Once | ORAL | 0 refills | Status: AC
Start: 2017-05-19 — End: 2017-05-19

## 2017-05-19 NOTE — Progress Notes (Signed)
No egg or soy allergy known to patient  No issues with past sedation with any surgeries  or procedures, no intubation problems  No diet pills per patient No home 02 use per patient  No blood thinners per patient  Pt denies issues with constipation  No A fib or A flutter  EMMI video sent to pt's e mail pt. Declined  

## 2017-06-02 ENCOUNTER — Encounter: Payer: PPO | Admitting: Internal Medicine

## 2017-06-21 HISTORY — PX: POLYPECTOMY: SHX149

## 2017-06-21 HISTORY — PX: COLONOSCOPY: SHX174

## 2017-07-04 DIAGNOSIS — R310 Gross hematuria: Secondary | ICD-10-CM | POA: Diagnosis not present

## 2017-07-04 DIAGNOSIS — R972 Elevated prostate specific antigen [PSA]: Secondary | ICD-10-CM | POA: Diagnosis not present

## 2017-07-04 DIAGNOSIS — N401 Enlarged prostate with lower urinary tract symptoms: Secondary | ICD-10-CM | POA: Diagnosis not present

## 2017-07-04 DIAGNOSIS — R351 Nocturia: Secondary | ICD-10-CM | POA: Diagnosis not present

## 2017-07-25 ENCOUNTER — Encounter (HOSPITAL_COMMUNITY): Payer: Self-pay

## 2017-07-25 DIAGNOSIS — M25511 Pain in right shoulder: Secondary | ICD-10-CM | POA: Insufficient documentation

## 2017-07-25 DIAGNOSIS — Z5321 Procedure and treatment not carried out due to patient leaving prior to being seen by health care provider: Secondary | ICD-10-CM | POA: Diagnosis not present

## 2017-07-25 LAB — I-STAT TROPONIN, ED: TROPONIN I, POC: 0 ng/mL (ref 0.00–0.08)

## 2017-07-25 NOTE — ED Triage Notes (Signed)
Pt complains of right shoulder pain and arm pain after loading bags of mulch today Pt had cardiac stents placed 15 yrs ago Pt denies chest pain currently

## 2017-07-26 ENCOUNTER — Emergency Department (HOSPITAL_COMMUNITY)
Admission: EM | Admit: 2017-07-26 | Discharge: 2017-07-26 | Disposition: A | Discharge status: Home / Self Care | Payer: Medicare HMO | Attending: Emergency Medicine | Admitting: Emergency Medicine

## 2017-08-02 ENCOUNTER — Other Ambulatory Visit: Payer: Self-pay | Admitting: General Practice

## 2017-08-02 MED ORDER — TAMSULOSIN HCL 0.4 MG PO CAPS: 0.4000 mg | ORAL_CAPSULE | Freq: Every day | ORAL | 0 refills | Status: DC

## 2017-09-07 ENCOUNTER — Encounter: Payer: Self-pay | Admitting: Family Medicine

## 2017-09-07 DIAGNOSIS — E785 Hyperlipidemia, unspecified: Secondary | ICD-10-CM

## 2017-09-08 ENCOUNTER — Other Ambulatory Visit (INDEPENDENT_AMBULATORY_CARE_PROVIDER_SITE_OTHER): Payer: Medicare HMO

## 2017-09-08 DIAGNOSIS — E785 Hyperlipidemia, unspecified: Secondary | ICD-10-CM

## 2017-09-08 LAB — CBC WITH DIFFERENTIAL/PLATELET
BASOS ABS: 0.1 10*3/uL (ref 0.0–0.1)
Basophils Relative: 1.3 % (ref 0.0–3.0)
Eosinophils Absolute: 0.1 10*3/uL (ref 0.0–0.7)
Eosinophils Relative: 2.2 % (ref 0.0–5.0)
HCT: 43.1 % (ref 39.0–52.0)
Hemoglobin: 14.9 g/dL (ref 13.0–17.0)
LYMPHS ABS: 1.7 10*3/uL (ref 0.7–4.0)
Lymphocytes Relative: 26.8 % (ref 12.0–46.0)
MCHC: 34.5 g/dL (ref 30.0–36.0)
MCV: 87.9 fl (ref 78.0–100.0)
MONO ABS: 0.5 10*3/uL (ref 0.1–1.0)
MONOS PCT: 7.1 % (ref 3.0–12.0)
NEUTROS PCT: 62.6 % (ref 43.0–77.0)
Neutro Abs: 4 10*3/uL (ref 1.4–7.7)
Platelets: 245 10*3/uL (ref 150.0–400.0)
RBC: 4.9 Mil/uL (ref 4.22–5.81)
RDW: 13.7 % (ref 11.5–15.5)
WBC: 6.4 10*3/uL (ref 4.0–10.5)

## 2017-09-08 LAB — BASIC METABOLIC PANEL
BUN: 19 mg/dL (ref 6–23)
CHLORIDE: 101 meq/L (ref 96–112)
CO2: 29 meq/L (ref 19–32)
Calcium: 9.8 mg/dL (ref 8.4–10.5)
Creatinine, Ser: 1.09 mg/dL (ref 0.40–1.50)
GFR: 71.13 mL/min (ref >60.00)
Glucose, Bld: 119 mg/dL — ABNORMAL HIGH (ref 70–99)
Potassium: 3.8 mEq/L (ref 3.5–5.1)
SODIUM: 139 meq/L (ref 135–145)

## 2017-09-08 LAB — LIPID PANEL
CHOL/HDL RATIO: 6
Cholesterol: 180 mg/dL (ref 0–200)
HDL: 29.4 mg/dL — AB (ref >39.00)
NonHDL: 150.2
TRIGLYCERIDES: 202 mg/dL — AB (ref 0.0–149.0)
VLDL: 40.4 mg/dL — ABNORMAL HIGH (ref 0.0–40.0)

## 2017-09-08 LAB — HEPATIC FUNCTION PANEL
ALBUMIN: 4.8 g/dL (ref 3.5–5.2)
ALK PHOS: 47 U/L (ref 39–117)
ALT: 15 U/L (ref 0–53)
AST: 16 U/L (ref 0–37)
Bilirubin, Direct: 0 mg/dL (ref 0.0–0.3)
TOTAL PROTEIN: 7.6 g/dL (ref 6.0–8.3)
Total Bilirubin: 0.6 mg/dL (ref 0.2–1.2)

## 2017-09-08 LAB — LDL CHOLESTEROL, DIRECT: Direct LDL: 116 mg/dL

## 2017-09-08 LAB — TSH: TSH: 2.14 u[IU]/mL (ref 0.35–4.50)

## 2017-09-09 ENCOUNTER — Other Ambulatory Visit (INDEPENDENT_AMBULATORY_CARE_PROVIDER_SITE_OTHER): Payer: Medicare HMO

## 2017-09-09 DIAGNOSIS — R7309 Other abnormal glucose: Secondary | ICD-10-CM

## 2017-09-09 LAB — HEMOGLOBIN A1C: Hgb A1c MFr Bld: 5.4 % (ref 4.6–6.5)

## 2017-09-10 ENCOUNTER — Encounter: Payer: Self-pay | Admitting: Family Medicine

## 2017-09-12 ENCOUNTER — Ambulatory Visit (INDEPENDENT_AMBULATORY_CARE_PROVIDER_SITE_OTHER): Payer: Medicare HMO | Admitting: Family Medicine

## 2017-09-12 ENCOUNTER — Other Ambulatory Visit: Payer: Self-pay

## 2017-09-12 ENCOUNTER — Encounter: Payer: Self-pay | Admitting: Family Medicine

## 2017-09-12 VITALS — BP 131/82 | HR 86 | Temp 98.1°F | Resp 16 | Ht 74.0 in | Wt 210.4 lb

## 2017-09-12 DIAGNOSIS — E785 Hyperlipidemia, unspecified: Secondary | ICD-10-CM | POA: Diagnosis not present

## 2017-09-12 MED ORDER — PANTOPRAZOLE SODIUM 40 MG PO TBEC: 40.0000 mg | DELAYED_RELEASE_TABLET | Freq: Every day | ORAL | 1 refills | Status: DC

## 2017-09-12 NOTE — Progress Notes (Signed)
   Subjective:    Patient ID: Jerry Simpson, male    DOB: Apr 14, 1946, 72 y.o.   MRN: [id number]  HPI Hyperlipidemia- chronic problem.  Pt is not taking his Lipitor 20mg  daily.  Is on Red Yeast Rice OTC.  Plans to rejoin [employer].  Total cholesterol improved from 213 --> 180.  Triglycerides elevated at 202.  HDL (good cholesterol) is low at 29.4.  Denies abd pain, N/V, myalgias.  Pt is not exercising.  No CP, SOB,.   Review of Systems For ROS see HPI     Objective:   Physical Exam  Constitutional: He is oriented to person, place, and time. He appears well-developed and well-nourished. No distress.  HENT:  Head: Normocephalic and atraumatic.  Eyes: Pupils are equal, round, and reactive to light. Conjunctivae and EOM are normal.  Neck: Normal range of motion. Neck supple. No thyromegaly present.  Cardiovascular: Normal rate, regular rhythm, normal heart sounds and intact distal pulses.  No murmur heard. Pulmonary/Chest: Effort normal and breath sounds normal. No respiratory distress.  Abdominal: Soft. Bowel sounds are normal. He exhibits no distension.  Musculoskeletal: He exhibits no edema.  Lymphadenopathy:    He has no cervical adenopathy.  Neurological: He is alert and oriented to person, place, and time. No cranial nerve deficit.  Skin: Skin is warm and dry.  Psychiatric: He has a normal mood and affect. His behavior is normal.  Vitals reviewed.         Assessment & Plan:

## 2017-09-12 NOTE — Patient Instructions (Signed)
Schedule your complete physical in 6 months The labs look good!  Keep up the good work! Continue to work on healthy diet and regular exercise- you can do it! Call with any questions or concerns Happy Spring!!!

## 2017-09-12 NOTE — Assessment & Plan Note (Signed)
Chronic problem.  Stopped Lipitor in favor of Red Yeast Rice.  Encouraged healthy diet and regular exercise.  Reviewed recent labs w/ pt.  Will continue to follow.

## 2017-09-15 DIAGNOSIS — R69 Illness, unspecified: Secondary | ICD-10-CM | POA: Diagnosis not present

## 2017-10-04 ENCOUNTER — Other Ambulatory Visit: Payer: Self-pay

## 2017-10-04 ENCOUNTER — Encounter: Payer: Self-pay | Admitting: Internal Medicine

## 2017-10-04 ENCOUNTER — Ambulatory Visit (AMBULATORY_SURGERY_CENTER): Payer: Self-pay | Admitting: *Deleted

## 2017-10-04 VITALS — Ht 74.0 in | Wt 211.0 lb

## 2017-10-04 DIAGNOSIS — Z86010 Personal history of colonic polyps: Secondary | ICD-10-CM

## 2017-10-04 NOTE — Progress Notes (Signed)
No egg or soy allergy known to patient  No issues with past sedation with any surgeries  or procedures, no intubation problems  No diet pills per patient No home 02 use per patient  No blood thinners per patient  Pt denies issues with constipation  No A fib or A flutter  EMMI video sent to pt's e mail - pt declined  Pt has suprep at home already

## 2017-10-05 DIAGNOSIS — H52203 Unspecified astigmatism, bilateral: Secondary | ICD-10-CM | POA: Diagnosis not present

## 2017-10-05 DIAGNOSIS — H26493 Other secondary cataract, bilateral: Secondary | ICD-10-CM | POA: Diagnosis not present

## 2017-10-05 DIAGNOSIS — H35372 Puckering of macula, left eye: Secondary | ICD-10-CM | POA: Diagnosis not present

## 2017-10-05 DIAGNOSIS — H43813 Vitreous degeneration, bilateral: Secondary | ICD-10-CM | POA: Diagnosis not present

## 2017-10-11 ENCOUNTER — Ambulatory Visit (AMBULATORY_SURGERY_CENTER): Payer: Medicare HMO | Admitting: Internal Medicine

## 2017-10-11 ENCOUNTER — Encounter: Payer: Self-pay | Admitting: Internal Medicine

## 2017-10-11 ENCOUNTER — Other Ambulatory Visit: Payer: Self-pay

## 2017-10-11 VITALS — BP 131/77 | HR 51 | Temp 98.0°F | Resp 13 | Ht 74.0 in | Wt 211.0 lb

## 2017-10-11 DIAGNOSIS — Z86010 Personal history of colonic polyps: Secondary | ICD-10-CM | POA: Diagnosis present

## 2017-10-11 DIAGNOSIS — D122 Benign neoplasm of ascending colon: Secondary | ICD-10-CM | POA: Diagnosis not present

## 2017-10-11 MED ORDER — SODIUM CHLORIDE 0.9 % IV SOLN: 500.0000 mL | Freq: Once | INTRAVENOUS | Status: DC

## 2017-10-11 NOTE — Progress Notes (Signed)
Spontaneous respirations throughout. VSS. Resting comfortably. To PACU on room air. Report to  RN. 

## 2017-10-11 NOTE — Progress Notes (Signed)
Pt's states no medical or surgical changes since previsit or office visit. 

## 2017-10-11 NOTE — Patient Instructions (Signed)
*  Handouts given to patient on polyps and diverticulosis.   YOU HAD AN ENDOSCOPIC PROCEDURE TODAY AT [facility]:   Refer to the procedure report that was given to you for any specific questions about what was found during the examination.  If the procedure report does not answer your questions, please call your gastroenterologist to clarify.  If you requested that your care partner not be given the details of your procedure findings, then the procedure report has been included in a sealed envelope for you to review at your convenience later.  YOU SHOULD EXPECT: Some feelings of bloating in the abdomen. Passage of more gas than usual.  Walking can help get rid of the air that was put into your GI tract during the procedure and reduce the bloating. If you had a lower endoscopy (such as a colonoscopy or flexible sigmoidoscopy) you may notice spotting of blood in your stool or on the toilet paper. If you underwent a bowel prep for your procedure, you may not have a normal bowel movement for a few days.  Please Note:  You might notice some irritation and congestion in your nose or some drainage.  This is from the oxygen used during your procedure.  There is no need for concern and it should clear up in a day or so.  SYMPTOMS TO REPORT IMMEDIATELY:   Following lower endoscopy (colonoscopy or flexible sigmoidoscopy):  Excessive amounts of blood in the stool  Significant tenderness or worsening of abdominal pains  Swelling of the abdomen that is new, acute  Fever of 100F or higher  For urgent or emergent issues, a gastroenterologist can be reached at any hour by calling [phone].   DIET:  We do recommend a small meal at first, but then you may proceed to your regular diet.  Drink plenty of fluids but you should avoid alcoholic beverages for 24 hours.  ACTIVITY:  You should plan to take it easy for the rest of today and you should NOT DRIVE or use heavy machinery until tomorrow  (because of the sedation medicines used during the test).    FOLLOW UP: Our staff will call the number listed on your records the next business day following your procedure to check on you and address any questions or concerns that you may have regarding the information given to you following your procedure. If we do not reach you, we will leave a message.  However, if you are feeling well and you are not experiencing any problems, there is no need to return our call.  We will assume that you have returned to your regular daily activities without incident.  If any biopsies were taken you will be contacted by phone or by letter within the next 1-3 weeks.  Please call us at [phone] if you have not heard about the biopsies in 3 weeks.    SIGNATURES/CONFIDENTIALITY: You and/or your care partner have signed paperwork which will be entered into your electronic medical record.  These signatures attest to the fact that that the information above on your After Visit Summary has been reviewed and is understood.  Full responsibility of the confidentiality of this discharge information lies with you and/or your care-partner.

## 2017-10-11 NOTE — Op Note (Signed)
[name]: Jerry Simpson Procedure Date: 10/11/2017 9:21 AM MRN: [id number] Endoscopist: Docia Chuck. Henrene Pastor , MD Age: 72 Referring MD:  Date of Birth: [birth year] Gender: Male Account #: [id number] Procedure:                Colonoscopy, with cold snare polypectomy x 1 Indications:              High risk colon cancer surveillance: Personal                            history of multiple (3 or more) adenomas. Previous                            examinations with Dr. Velora Heckler most recently 2005                            and myself most recently 2013 Medicines:                Monitored Anesthesia Care Procedure:                Pre-Anesthesia Assessment:                           - Prior to the procedure, a History and Physical                            was performed, and patient medications and                            allergies were reviewed. The patient's tolerance of                            previous anesthesia was also reviewed. The risks                            and benefits of the procedure and the sedation                            options and risks were discussed with the patient.                            All questions were answered, and informed consent                            was obtained. Prior Anticoagulants: The patient has                            taken no previous anticoagulant or antiplatelet                            agents. ASA Grade Assessment: II - A patient with                            mild systemic disease. After reviewing the risks  and benefits, the patient was deemed in                            satisfactory condition to undergo the procedure.                           After obtaining informed consent, the colonoscope                            was passed under direct vision. Throughout the                            procedure, the patient's blood pressure, pulse, and   oxygen saturations were monitored continuously. The                            Colonoscope was introduced through the anus and                            advanced to the the cecum, identified by                            appendiceal orifice and ileocecal valve. The                            ileocecal valve, appendiceal orifice, and rectum                            were photographed. The quality of the bowel                            preparation was excellent. The colonoscopy was                            performed without difficulty. The patient tolerated                            the procedure well. The bowel preparation used was                            SUPREP. Scope In: 9:38:08 AM Scope Out: 4:00:86 AM Scope Withdrawal Time: 0 hours 13 minutes 29 seconds  Total Procedure Duration: 0 hours 18 minutes 30 seconds  Findings:                 Two polyps were found in the ascending colon. The                            polyps were 2 to 5 mm in size. These polyps were                            removed with a cold snare. Resection and retrieval                            were  complete.                           Multiple diverticula were found in the sigmoid                            colon.                           The exam was otherwise without abnormality on                            direct and retroflexion views. Complications:            No immediate complications. Estimated blood loss:                            None. Estimated Blood Loss:     Estimated blood loss: none. Impression:               - Two 2 to 5 mm polyps in the ascending colon,                            removed with a cold snare. Resected and retrieved.                           - Diverticulosis in the sigmoid colon.                           - The examination was otherwise normal on direct                            and retroflexion views. Recommendation:           - Repeat colonoscopy in 5 years for  surveillance.                           - Patient has a contact number available for                            emergencies. The signs and symptoms of potential                            delayed complications were discussed with the                            patient. Return to normal activities tomorrow.                            Written discharge instructions were provided to the                            patient.                           - Resume previous diet.                           -  Continue present medications.                           - Await pathology results. Docia Chuck. Henrene Pastor, MD 10/11/2017 10:06:32 AM This report has been signed electronically.

## 2017-10-11 NOTE — Progress Notes (Signed)
Called to room to assist during endoscopic procedure.  Patient ID and intended procedure confirmed with present staff. Received instructions for my participation in the procedure from the performing physician.  

## 2017-10-12 ENCOUNTER — Telehealth: Payer: Self-pay

## 2017-10-12 NOTE — Telephone Encounter (Signed)
  Follow up Call-  Call back number 10/11/2017  Post procedure Call Back phone  # [phone]  Permission to leave phone message Yes  Some recent data might be hidden     Patient questions:  Do you have a fever, pain , or abdominal swelling? Yes.   Pain Score  0 * "a two Advil headache last night but gone now"  Have you tolerated food without any problems? Yes.    Have you been able to return to your normal activities? Yes.    Do you have any questions about your discharge instructions: Diet   No. Medications  No. Follow up visit  No.  Do you have questions or concerns about your Care? No.  Actions: * If pain score is 4 or above: No action needed, pain <4.

## 2017-10-17 ENCOUNTER — Encounter: Payer: Self-pay | Admitting: Internal Medicine

## 2017-11-09 DIAGNOSIS — R69 Illness, unspecified: Secondary | ICD-10-CM | POA: Diagnosis not present

## 2017-11-12 ENCOUNTER — Other Ambulatory Visit: Payer: Self-pay | Admitting: Family Medicine

## 2017-12-07 DIAGNOSIS — R338 Other retention of urine: Secondary | ICD-10-CM | POA: Diagnosis not present

## 2017-12-10 ENCOUNTER — Emergency Department (HOSPITAL_COMMUNITY)
Admission: EM | Admit: 2017-12-10 | Discharge: 2017-12-10 | Disposition: A | Discharge status: Home / Self Care | Payer: Medicare HMO | Attending: Emergency Medicine | Admitting: Emergency Medicine

## 2017-12-10 ENCOUNTER — Other Ambulatory Visit: Payer: Self-pay

## 2017-12-10 ENCOUNTER — Encounter (HOSPITAL_COMMUNITY): Payer: Self-pay

## 2017-12-10 DIAGNOSIS — Y829 Unspecified medical devices associated with adverse incidents: Secondary | ICD-10-CM | POA: Diagnosis not present

## 2017-12-10 DIAGNOSIS — E785 Hyperlipidemia, unspecified: Secondary | ICD-10-CM | POA: Diagnosis not present

## 2017-12-10 DIAGNOSIS — T839XXA Unspecified complication of genitourinary prosthetic device, implant and graft, initial encounter: Secondary | ICD-10-CM

## 2017-12-10 DIAGNOSIS — I2510 Atherosclerotic heart disease of native coronary artery without angina pectoris: Secondary | ICD-10-CM | POA: Insufficient documentation

## 2017-12-10 DIAGNOSIS — Z79899 Other long term (current) drug therapy: Secondary | ICD-10-CM | POA: Insufficient documentation

## 2017-12-10 DIAGNOSIS — T83098A Other mechanical complication of other indwelling urethral catheter, initial encounter: Secondary | ICD-10-CM | POA: Diagnosis not present

## 2017-12-10 NOTE — Discharge Instructions (Signed)
1.  Follow-up with your urologist on Monday. 2.  Return to the emergency department if the catheter does not seem to be draining, you are getting pressure and pain in your bladder area, fever or other concerning symptoms.

## 2017-12-10 NOTE — ED Notes (Signed)
Patient verbalized understanding of discharge instructions, no questions. Foley education given. Patient ambulated out of ED with steady gait in no distress.

## 2017-12-10 NOTE — ED Provider Notes (Signed)
[facility] DEPT Provider Note   CSN: [id number] Arrival date & time: 12/10/17  1207     History   Chief Complaint Chief Complaint  Patient presents with  . urinary cath issues    HPI Jerry Simpson is a 72 y.o. male.  HPI Patient reports every so often he develops urinary retention.  He reports his prostate is the size of a softball.  Typically he does not require indwelling Foley catheter.  He however developed significant urinary retention Wednesday.  He called his urologist and they placed a Foley catheter in the office.  He reports that was effective but starting last night it seemed to get blocked up.  He could see some clot material in the tubing.  He reports he started to leak urine around the catheter and was having burning sensation urethra. Past Medical History:  Diagnosis Date  . Adenomatous polyps   . Allergy   . BPH (benign prostatic hyperplasia)    elevated PSA, Dr Jeffie Pollock  . CAD (coronary artery disease)   . Cataract    removed both eyes   . Environmental allergies   . GERD (gastroesophageal reflux disease)   . Hyperlipidemia     Patient Active Problem List   Diagnosis Date Noted  . Urine frequency 11/14/2015  . Post-viral cough syndrome 11/14/2015  . Upper airway cough syndrome 06/12/2015  . Physical exam 07/09/2014  . Sleep apnea 07/09/2014  . Bladder tumor   . Reactive airway disease that is not asthma 05/13/2014  . Blood donor 07/04/2013  . HYPERGLYCEMIA, FASTING 07/16/2010  . Coronary atherosclerosis 10/16/2008  . BENIGN PROSTATIC HYPERTROPHY 10/16/2008  . SKIN CANCER, HX OF 10/16/2008  . COLONIC POLYPS, HX OF 10/16/2008  . ELEVATED PROSTATE SPECIFIC ANTIGEN 06/05/2008  . ELEVATED BLOOD PRESSURE WITHOUT DIAGNOSIS OF HYPERTENSION 06/05/2008  . Hyperlipidemia 09/27/2007  . GERD 06/20/2007  . Allergic rhinitis, cause unspecified 05/05/2007    Past Surgical History:  Procedure Laterality Date  . ANGIOPLASTY   2004   stent placement ;sees Dr Ron Parker  . CATARACT EXTRACTION, BILATERAL  2012  . COLONOSCOPY    . colonoscopy with polypectomy     X2, Dr Henrene Pastor  . POLYPECTOMY    . TONSILLECTOMY  1962        Home Medications    Prior to Admission medications   Medication Sig Start Date End Date Taking? Authorizing Provider  fexofenadine (ALLEGRA) 180 MG tablet Take 180 mg by mouth daily as needed for allergies.    Yes [provider]  fluticasone (FLONASE) 50 MCG/ACT nasal spray Place 2 sprays into both nostrils daily. Patient taking differently: Place 2 sprays into both nostrils daily as needed for allergies.  05/13/15  Yes Saguier, Percell Miller, PA-C  Ibuprofen (ADVIL) 200 MG CAPS Take 1 capsule by mouth at bedtime.   Yes [provider]  pantoprazole (PROTONIX) 40 MG tablet Take 1 tablet (40 mg total) by mouth daily. Patient taking differently: Take 40 mg by mouth daily as needed (indigestion).  09/12/17  Yes Midge Minium, MD  Red Yeast Rice Extract (RED YEAST RICE PO) Take 2 tablets by mouth daily.    Yes [provider]  tamsulosin (FLOMAX) 0.4 MG CAPS capsule TAKE ONE CAPSULE BY MOUTH DAILY Patient taking differently: TAKE 2 CAPSULES BY MOUTH DAILY 11/15/17  Yes Midge Minium, MD    Family History Family History  Problem Relation Age of Onset  . Hypertension Father   . Heart attack Father 56  .  Diabetes Father   . Cancer Brother        lung cancer ; leukemia terminally  . Heart attack Brother 13  . Heart disease Paternal Uncle 65  . Stroke Maternal Grandmother        >65  . Colon cancer Neg Hx   . Stomach cancer Neg Hx   . Colon polyps Neg Hx   . Esophageal cancer Neg Hx   . Rectal cancer Neg Hx     Social History Social History   Tobacco Use  . Smoking status: Never Smoker  . Smokeless tobacco: Never Used  Substance Use Topics  . Alcohol use: Yes    Alcohol/week: 4.2 oz    Types: 7 drink(s) per week    Comment:  < 7/ week  . Drug use: No       Allergies   Sulfonamide derivatives and Claritin [loratadine]   Review of Systems Review of Systems 10 Systems reviewed and are negative for acute change except as noted in the HPI.   Physical Exam Updated Vital Signs BP (!) 143/90 (BP Location: Left Arm)   Pulse 93   Temp 97.9 F (36.6 C) (Oral)   Resp 16   Ht 6\' 2"  (1.88 m)   Wt 91.6 kg (202 lb)   SpO2 99%   BMI 25.94 kg/m   Physical Exam  Constitutional: He is oriented to person, place, and time. He appears well-developed and well-nourished. No distress.  HENT:  Head: Normocephalic and atraumatic.  Eyes: EOM are normal.  Cardiovascular: Normal rate, regular rhythm, normal heart sounds and intact distal pulses.  Pulmonary/Chest: Effort normal and breath sounds normal.  Abdominal: Soft.  Mild suprapubic discomfort but no firm mass.  Genitourinary:  Genitourinary Comments: Penis is normal in appearance.  There is a catheter in place.  No blood at the meatus.  Swelling of the scrotum or penis.  There is some rusty material in the neck of the catheter bag.  Musculoskeletal: Normal range of motion.  Neurological: He is alert and oriented to person, place, and time. He exhibits normal muscle tone. Coordination normal.  Skin: Skin is warm and dry.  Psychiatric: He has a normal mood and affect.     ED Treatments / Results  Labs (all labs ordered are listed, but only abnormal results are displayed) Labs Reviewed - No data to display  EKG None  Radiology No results found.  Procedures Procedures (including critical care time)  Medications Ordered in ED Medications - No data to display   Initial Impression / Assessment and Plan / ED Course  I have reviewed the triage vital signs and the nursing notes.  Pertinent labs & imaging results that were available during my care of the patient were reviewed by me and considered in my medical decision making (see chart for details).      Final Clinical  Impressions(s) / ED Diagnoses   Final diagnoses:  Problem with Foley catheter, initial encounter [facility])   Patient was having leakage around the catheter.  Balloon was checked.  8 mils withdrawn by nurse and replaced with 15 mils.  This improved function per the patient.  At this time he reports he no longer has a burning sensation of any urine going around the catheter.  Catheter was irrigated with just small amount of rusty tinged blood but otherwise clear.  No large clots per nursing staff.  Catheter is now draining and patient feels comfortable.  He has follow-up on Monday.  Return precautions  reviewed. ED Discharge Orders    None       Charlesetta Shanks, MD 12/10/17 1444

## 2017-12-10 NOTE — ED Notes (Signed)
Upon standing, patient noted to have catheter leakage. Provided new pants. Will notify MD.

## 2017-12-10 NOTE — ED Notes (Signed)
Bladder scanner= 30ml. Balloon catheter intact, 46ml NS out of balloon. No apparent blockage noted.

## 2017-12-10 NOTE — ED Notes (Signed)
Foley irrigated, no clots noted. Patient tolerated well. 74ml NS placed into balloon for a total of 72ml. Leg bag replaced. MD notified.

## 2017-12-10 NOTE — ED Triage Notes (Signed)
Patient reports that his urinary catheter is "stopped up," and has been having drainage from around the catheter. Patient reports clots.

## 2017-12-12 DIAGNOSIS — R338 Other retention of urine: Secondary | ICD-10-CM | POA: Diagnosis not present

## 2017-12-27 ENCOUNTER — Encounter: Payer: Self-pay | Admitting: Family Medicine

## 2017-12-29 ENCOUNTER — Ambulatory Visit: Payer: PPO

## 2017-12-29 ENCOUNTER — Other Ambulatory Visit: Payer: Self-pay

## 2017-12-29 ENCOUNTER — Ambulatory Visit (INDEPENDENT_AMBULATORY_CARE_PROVIDER_SITE_OTHER): Payer: Medicare HMO

## 2017-12-29 VITALS — BP 150/80 | Ht 74.0 in | Wt 210.1 lb

## 2017-12-29 DIAGNOSIS — Z23 Encounter for immunization: Secondary | ICD-10-CM | POA: Diagnosis not present

## 2017-12-29 DIAGNOSIS — Z0000 Encounter for general adult medical examination without abnormal findings: Secondary | ICD-10-CM | POA: Diagnosis not present

## 2017-12-29 MED ORDER — ZOSTER VAC RECOMB ADJUVANTED 50 MCG/0.5ML IM SUSR: 0.5000 mL | Freq: Once | INTRAMUSCULAR | 1 refills | Status: AC

## 2017-12-29 NOTE — Patient Instructions (Addendum)
Shingles vaccine at pharmacy  Continue doing brain stimulating activities (puzzles, reading, adult coloring books, staying active) to keep memory sharp.   Bring a copy of your living will and/or healthcare power of attorney to your next office visit.   Health Maintenance, Male A healthy lifestyle and preventive care is important for your health and wellness. Ask your health care provider about what schedule of regular examinations is right for you. What should I know about weight and diet? Eat a Healthy Diet  Eat plenty of vegetables, fruits, whole grains, low-fat dairy products, and lean protein.  Do not eat a lot of foods high in solid fats, added sugars, or salt.  Maintain a Healthy Weight Regular exercise can help you achieve or maintain a healthy weight. You should:  Do at least 150 minutes of exercise each week. The exercise should increase your heart rate and make you sweat (moderate-intensity exercise).  Do strength-training exercises at least twice a week.  Watch Your Levels of Cholesterol and Blood Lipids  Have your blood tested for lipids and cholesterol every 5 years starting at 70 years of age. If you are at high risk for heart disease, you should start having your blood tested when you are 70 years old. You may need to have your cholesterol levels checked more often if: ? Your lipid or cholesterol levels are high. ? You are older than 70 years of age. ? You are at high risk for heart disease.  What should I know about cancer screening? Many types of cancers can be detected early and may often be prevented. Lung Cancer  You should be screened every year for lung cancer if: ? You are a current smoker who has smoked for at least 30 years. ? You are a former smoker who has quit within the past 15 years.  Talk to your health care provider about your screening options, when you should start screening, and how often you should be screened.  Colorectal Cancer  Routine  colorectal cancer screening usually begins at 71 years of age and should be repeated every 5-10 years until you are 70 years old. You may need to be screened more often if early forms of precancerous polyps or small growths are found. Your health care provider may recommend screening at an earlier age if you have risk factors for colon cancer.  Your health care provider may recommend using home test kits to check for hidden blood in the stool.  A small camera at the end of a tube can be used to examine your colon (sigmoidoscopy or colonoscopy). This checks for the earliest forms of colorectal cancer.  Prostate and Testicular Cancer  Depending on your age and overall health, your health care provider may do certain tests to screen for prostate and testicular cancer.  Talk to your health care provider about any symptoms or concerns you have about testicular or prostate cancer.  Skin Cancer  Check your skin from head to toe regularly.  Tell your health care provider about any new moles or changes in moles, especially if: ? There is a change in a mole's size, shape, or color. ? You have a mole that is larger than a pencil eraser.  Always use sunscreen. Apply sunscreen liberally and repeat throughout the day.  Protect yourself by wearing long sleeves, pants, a wide-brimmed hat, and sunglasses when outside.  What should I know about heart disease, diabetes, and high blood pressure?  If you are 48-29 years of age, have  your blood pressure checked every 3-5 years. If you are 20 years of age or older, have your blood pressure checked every year. You should have your blood pressure measured twice-once when you are at a hospital or clinic, and once when you are not at a hospital or clinic. Record the average of the two measurements. To check your blood pressure when you are not at a hospital or clinic, you can use: ? An automated blood pressure machine at a pharmacy. ? A home blood pressure  monitor.  Talk to your health care provider about your target blood pressure.  If you are between 4-39 years old, ask your health care provider if you should take aspirin to prevent heart disease.  Have regular diabetes screenings by checking your fasting blood sugar level. ? If you are at a normal weight and have a low risk for diabetes, have this test once every three years after the age of 63. ? If you are overweight and have a high risk for diabetes, consider being tested at a younger age or more often.  A one-time screening for abdominal aortic aneurysm (AAA) by ultrasound is recommended for men aged 54-75 years who are current or former smokers. What should I know about preventing infection? Hepatitis B If you have a higher risk for hepatitis B, you should be screened for this virus. Talk with your health care provider to find out if you are at risk for hepatitis B infection. Hepatitis C Blood testing is recommended for:  Everyone born from [birth year] through 1963.  Anyone with known risk factors for hepatitis C.  Sexually Transmitted Diseases (STDs)  You should be screened each year for STDs including gonorrhea and chlamydia if: ? You are sexually active and are younger than 70 years of age. ? You are older than 70 years of age and your health care provider tells you that you are at risk for this type of infection. ? Your sexual activity has changed since you were last screened and you are at an increased risk for chlamydia or gonorrhea. Ask your health care provider if you are at risk.  Talk with your health care provider about whether you are at high risk of being infected with HIV. Your health care provider may recommend a prescription medicine to help prevent HIV infection.  What else can I do?  Schedule regular health, dental, and eye exams.  Stay current with your vaccines (immunizations).  Do not use any tobacco products, such as cigarettes, chewing tobacco, and  e-cigarettes. If you need help quitting, ask your health care provider.  Limit alcohol intake to no more than 2 drinks per day. One drink equals 12 ounces of beer, 5 ounces of wine, or 1 ounces of hard liquor.  Do not use street drugs.  Do not share needles.  Ask your health care provider for help if you need support or information about quitting drugs.  Tell your health care provider if you often feel depressed.  Tell your health care provider if you have ever been abused or do not feel safe at home. This information is not intended to replace advice given to you by your health care provider. Make sure you discuss any questions you have with your health care provider. Document Released: 12/04/2007 Document Revised: 02/04/2016 Document Reviewed: 03/11/2015 Elsevier Interactive Patient Education  [employer].

## 2017-12-29 NOTE — Progress Notes (Addendum)
Subjective:   Jerry Simpson is a 72 y.o. male who presents for Medicare Annual/Subsequent preventive examination.  Review of Systems:  No ROS.  Medicare Wellness Visit. Additional risk factors are reflected in the social history.  Cardiac Risk Factors include: advanced age (>23men, >58 women);male gender;dyslipidemia;family history of premature cardiovascular disease   Sleep patterns: Sleeps 7 hours.  Home Safety/Smoke Alarms: Feels safe in home. Smoke alarms in place.  Living environment; residence and Firearm Safety:  [place] Safety/Bike Helmet: Wears seat belt.    Male:   CCS-Colonoscopy 10/11/2017, polyps. Recall 5 years.  PSA-  Lab Results  Component Value Date   PSA 5.83 (H) 03/10/2017   PSA 4.56 (H) 06/27/2012   PSA 1.05 07/26/2011       Objective:    Vitals: BP (!) 150/80 (BP Location: Left Arm, Cuff Size: Normal)   Ht 6\' 2"  (1.88 m)   Wt 210 lb 2 oz (95.3 kg)   BMI 26.98 kg/m   Body mass index is 26.98 kg/m.  Advanced Directives 12/29/2017 10/11/2017 10/04/2017 12/15/2016  Does Patient Have a Medical Advance Directive? Yes Yes Yes Yes  Type of Paramedic of [place];Living will - - [place];Living will  Copy of [place] in Chart? No - copy requested - - No - copy requested    Tobacco Social History   Tobacco Use  Smoking Status Never Smoker  Smokeless Tobacco Never Used     Counseling given: Not Answered    Past Medical History:  Diagnosis Date  . Adenomatous polyps   . Allergy   . BPH (benign prostatic hyperplasia)    elevated PSA, Dr Jeffie Pollock  . CAD (coronary artery disease)   . Cataract    removed both eyes   . Environmental allergies   . GERD (gastroesophageal reflux disease)   . Hyperlipidemia    Past Surgical History:  Procedure Laterality Date  . ANGIOPLASTY  2004   stent placement ;sees Dr Ron Parker  . CATARACT EXTRACTION, BILATERAL  2012  . COLONOSCOPY    . colonoscopy  with polypectomy     X2, Dr Henrene Pastor  . POLYPECTOMY    . TONSILLECTOMY  1962   Family History  Problem Relation Age of Onset  . Hypertension Father   . Heart attack Father 66  . Diabetes Father   . Cancer Brother        lung cancer ; leukemia terminally  . Heart attack Brother 93  . Heart disease Paternal Uncle 13  . Stroke Maternal Grandmother        >65  . Colon cancer Neg Hx   . Stomach cancer Neg Hx   . Colon polyps Neg Hx   . Esophageal cancer Neg Hx   . Rectal cancer Neg Hx    Social History   Socioeconomic History  . Marital status: Married    Spouse name: Not on file  . Number of children: Not on file  . Years of education: Not on file  . Highest education level: Not on file  Occupational History  . Not on file  Social Needs  . Financial resource strain: Not on file  . Food insecurity:    Worry: Not on file    Inability: Not on file  . Transportation needs:    Medical: Not on file    Non-medical: Not on file  Tobacco Use  . Smoking status: Never Smoker  . Smokeless tobacco: Never Used  Substance  and Sexual Activity  . Alcohol use: Yes    Alcohol/week: 4.2 oz    Types: 7 drink(s) per week    Comment:  < 7/ week  . Drug use: No  . Sexual activity: Not on file  Lifestyle  . Physical activity:    Days per week: Not on file    Minutes per session: Not on file  . Stress: Not on file  Relationships  . Social connections:    Talks on phone: Not on file    Gets together: Not on file    Attends religious service: Not on file    Active member of club or organization: Not on file    Attends meetings of clubs or organizations: Not on file    Relationship status: Not on file  Other Topics Concern  . Not on file  Social History Narrative  . Not on file    Outpatient Encounter Medications as of 12/29/2017  Medication Sig  . ASPIRIN 81 PO Take by mouth.  . fexofenadine (ALLEGRA) 180 MG tablet Take 180 mg by mouth daily as needed for allergies.   .  fluticasone (FLONASE) 50 MCG/ACT nasal spray Place 2 sprays into both nostrils daily. (Patient taking differently: Place 2 sprays into both nostrils daily as needed for allergies. )  . Ibuprofen (ADVIL) 200 MG CAPS Take 1 capsule by mouth at bedtime.  . pantoprazole (PROTONIX) 40 MG tablet Take 1 tablet (40 mg total) by mouth daily. (Patient taking differently: Take 40 mg by mouth daily as needed (indigestion). )  . Red Yeast Rice Extract (RED YEAST RICE PO) Take 2 tablets by mouth daily.   . tamsulosin (FLOMAX) 0.4 MG CAPS capsule TAKE ONE CAPSULE BY MOUTH DAILY (Patient taking differently: TAKE 2 CAPSULES BY MOUTH DAILY)   Facility-Administered Encounter Medications as of 12/29/2017  Medication  . 0.9 %  sodium chloride infusion    Activities of Daily Living In your present state of health, do you have any difficulty performing the following activities: 12/29/2017 09/12/2017  Hearing? N N  Vision? N N  Difficulty concentrating or making decisions? N N  Walking or climbing stairs? N N  Dressing or bathing? N N  Doing errands, shopping? N N  Preparing Food and eating ? N -  Using the Toilet? N -  In the past six months, have you accidently leaked urine? N -  Do you have problems with loss of bowel control? N -  Managing your Medications? N -  Managing your Finances? N -  Housekeeping or managing your Housekeeping? N -  Some recent data might be hidden    Patient Care Team: Midge Minium, MD as PCP - General (Family Medicine) Irene Shipper, MD as Consulting Physician (Gastroenterology) Beryle Lathe, MD as Referring Physician (General Surgery) [facility], Michae Kava, MD as Attending Physician Allyn Kenner, MD (Dermatology)   Assessment:   This is a routine wellness examination for [name].  Exercise Activities and Dietary recommendations Current Exercise Habits: The patient does not participate in regular exercise at present(Renovating houses), Exercise limited by:  orthopedic condition(s) Diet (meal preparation, eat out, water intake, caffeinated beverages, dairy products, fruits and vegetables): Drinks water and coffee.   Eats 3 meals/day, plenty of fruits/veggies. Attempts to follow [employer] meal plan.   Goals    . Increase physical activity     Increase activity    . Weight (lb) < 198 lb (89.8 kg)     Lose belly weight by increasing  exercises.        Fall Risk Fall Risk  12/29/2017 09/12/2017 03/14/2017 12/15/2016 08/18/2015  Falls in the past year? No No No No No    Depression Screen PHQ 2/9 Scores 12/29/2017 09/12/2017 03/14/2017 12/15/2016  PHQ - 2 Score 0 0 0 0  PHQ- 9 Score - 0 0 -    Cognitive Function       Ad8 score reviewed for issues:  Issues making decisions: no  Less interest in hobbies / activities: no  Repeats questions, stories (family complaining): no  Trouble using ordinary gadgets (microwave, computer, phone): no  Forgets the month or year: no  Mismanaging finances: no  Remembering appts: no  Daily problems with thinking and/or memory: no Ad8 score is=0     Immunization History  Administered Date(s) Administered  . Influenza Split 03/30/2011, 04/10/2012  . Influenza Whole 04/25/2007, 04/03/2009, 06/05/2010  . Influenza, High Dose Seasonal PF 05/08/2013, 04/02/2015, 04/08/2016  . Influenza,inj,Quad PF,6+ Mos 04/18/2014, 03/14/2017  . Pneumococcal Conjugate-13 07/04/2013  . Pneumococcal Polysaccharide-23 07/09/2014  . Tdap 06/30/2012  . Zoster 03/30/2011    Screening Tests Health Maintenance  Topic Date Due  . Hepatitis C Screening  03/21/2018 (Originally 27-Nov-1945)  . INFLUENZA VACCINE  01/19/2018  . TETANUS/TDAP  06/30/2022  . COLONOSCOPY  10/12/2022  . PNA vac Low Risk Adult  Completed        Plan:     Shingles vaccine at pharmacy  Continue doing brain stimulating activities (puzzles, reading, adult coloring books, staying active) to keep memory sharp.   Bring a copy of your living will  and/or healthcare power of attorney to your next office visit.   I have personally reviewed and noted the following in the patient's chart:   . Medical and social history . Use of alcohol, tobacco or illicit drugs  . Current medications and supplements . Functional ability and status . Nutritional status . Physical activity . Advanced directives . List of other physicians . Hospitalizations, surgeries, and ER visits in previous 12 months . Vitals . Screenings to include cognitive, depression, and falls . Referrals and appointments  In addition, I have reviewed and discussed with patient certain preventive protocols, quality metrics, and best practice recommendations. A written personalized care plan for preventive services as well as general preventive health recommendations were provided to patient.     Gerilyn Nestle, RN  12/29/2017  Reviewed documentation provided by RN and agree w/ above.  Annye Asa, MD

## 2018-01-03 DIAGNOSIS — I2510 Atherosclerotic heart disease of native coronary artery without angina pectoris: Secondary | ICD-10-CM | POA: Diagnosis not present

## 2018-01-03 DIAGNOSIS — R972 Elevated prostate specific antigen [PSA]: Secondary | ICD-10-CM | POA: Diagnosis not present

## 2018-01-03 DIAGNOSIS — N400 Enlarged prostate without lower urinary tract symptoms: Secondary | ICD-10-CM | POA: Diagnosis not present

## 2018-01-03 DIAGNOSIS — E7800 Pure hypercholesterolemia, unspecified: Secondary | ICD-10-CM | POA: Diagnosis not present

## 2018-01-03 DIAGNOSIS — N401 Enlarged prostate with lower urinary tract symptoms: Secondary | ICD-10-CM | POA: Diagnosis not present

## 2018-01-03 DIAGNOSIS — Z882 Allergy status to sulfonamides status: Secondary | ICD-10-CM | POA: Diagnosis not present

## 2018-01-29 ENCOUNTER — Other Ambulatory Visit: Payer: Self-pay | Admitting: Family Medicine

## 2018-01-30 ENCOUNTER — Encounter: Payer: Self-pay | Admitting: Family Medicine

## 2018-02-28 DIAGNOSIS — R338 Other retention of urine: Secondary | ICD-10-CM | POA: Diagnosis not present

## 2018-02-28 DIAGNOSIS — N401 Enlarged prostate with lower urinary tract symptoms: Secondary | ICD-10-CM | POA: Diagnosis not present

## 2018-03-03 DIAGNOSIS — Z0100 Encounter for examination of eyes and vision without abnormal findings: Secondary | ICD-10-CM | POA: Diagnosis not present

## 2018-03-13 ENCOUNTER — Other Ambulatory Visit: Payer: Self-pay | Admitting: Family Medicine

## 2018-03-13 ENCOUNTER — Encounter: Payer: Self-pay | Admitting: Family Medicine

## 2018-03-13 DIAGNOSIS — R972 Elevated prostate specific antigen [PSA]: Secondary | ICD-10-CM

## 2018-03-13 DIAGNOSIS — E785 Hyperlipidemia, unspecified: Secondary | ICD-10-CM

## 2018-03-14 ENCOUNTER — Other Ambulatory Visit (INDEPENDENT_AMBULATORY_CARE_PROVIDER_SITE_OTHER): Payer: Medicare HMO

## 2018-03-14 DIAGNOSIS — E785 Hyperlipidemia, unspecified: Secondary | ICD-10-CM | POA: Diagnosis not present

## 2018-03-14 DIAGNOSIS — R972 Elevated prostate specific antigen [PSA]: Secondary | ICD-10-CM | POA: Diagnosis not present

## 2018-03-14 LAB — CBC WITH DIFFERENTIAL/PLATELET
BASOS ABS: 0.1 10*3/uL (ref 0.0–0.1)
Basophils Relative: 1.3 % (ref 0.0–3.0)
EOS PCT: 2.9 % (ref 0.0–5.0)
Eosinophils Absolute: 0.2 10*3/uL (ref 0.0–0.7)
HEMATOCRIT: 43.7 % (ref 39.0–52.0)
Hemoglobin: 14.7 g/dL (ref 13.0–17.0)
LYMPHS ABS: 1.5 10*3/uL (ref 0.7–4.0)
LYMPHS PCT: 27.1 % (ref 12.0–46.0)
MCHC: 33.7 g/dL (ref 30.0–36.0)
MCV: 84.8 fl (ref 78.0–100.0)
MONOS PCT: 7.6 % (ref 3.0–12.0)
Monocytes Absolute: 0.4 10*3/uL (ref 0.1–1.0)
Neutro Abs: 3.4 10*3/uL (ref 1.4–7.7)
Neutrophils Relative %: 61.1 % (ref 43.0–77.0)
Platelets: 224 10*3/uL (ref 150.0–400.0)
RBC: 5.15 Mil/uL (ref 4.22–5.81)
RDW: 14.1 % (ref 11.5–15.5)
WBC: 5.5 10*3/uL (ref 4.0–10.5)

## 2018-03-14 LAB — BASIC METABOLIC PANEL
BUN: 19 mg/dL (ref 6–23)
CALCIUM: 9 mg/dL (ref 8.4–10.5)
CHLORIDE: 103 meq/L (ref 96–112)
CO2: 30 meq/L (ref 19–32)
CREATININE: 1.01 mg/dL (ref 0.40–1.50)
GFR: 77.56 mL/min (ref >60.00)
Glucose, Bld: 108 mg/dL — ABNORMAL HIGH (ref 70–99)
Potassium: 4.1 mEq/L (ref 3.5–5.1)
Sodium: 138 mEq/L (ref 135–145)

## 2018-03-14 LAB — LIPID PANEL
CHOL/HDL RATIO: 6
Cholesterol: 175 mg/dL (ref 0–200)
HDL: 28.4 mg/dL — ABNORMAL LOW (ref >39.00)
LDL CALC: 113 mg/dL — AB (ref 0–99)
NONHDL: 146.72
TRIGLYCERIDES: 169 mg/dL — AB (ref 0.0–149.0)
VLDL: 33.8 mg/dL (ref 0.0–40.0)

## 2018-03-14 LAB — HEPATIC FUNCTION PANEL
ALT: 15 U/L (ref 0–53)
AST: 16 U/L (ref 0–37)
Albumin: 4.3 g/dL (ref 3.5–5.2)
Alkaline Phosphatase: 45 U/L (ref 39–117)
Bilirubin, Direct: 0.1 mg/dL (ref 0.0–0.3)
TOTAL PROTEIN: 7 g/dL (ref 6.0–8.3)
Total Bilirubin: 0.6 mg/dL (ref 0.2–1.2)

## 2018-03-14 LAB — TSH: TSH: 1.92 u[IU]/mL (ref 0.35–4.50)

## 2018-03-14 LAB — PSA: PSA: 0.99 ng/mL (ref 0.10–4.00)

## 2018-03-16 ENCOUNTER — Encounter: Payer: Self-pay | Admitting: General Practice

## 2018-03-16 ENCOUNTER — Ambulatory Visit (INDEPENDENT_AMBULATORY_CARE_PROVIDER_SITE_OTHER): Payer: Medicare HMO | Admitting: Family Medicine

## 2018-03-16 ENCOUNTER — Other Ambulatory Visit: Payer: Self-pay

## 2018-03-16 ENCOUNTER — Telehealth: Payer: Self-pay | Admitting: Family Medicine

## 2018-03-16 ENCOUNTER — Encounter: Payer: Self-pay | Admitting: Family Medicine

## 2018-03-16 VITALS — BP 124/82 | HR 70 | Temp 98.4°F | Resp 16 | Ht 74.0 in | Wt 211.4 lb

## 2018-03-16 DIAGNOSIS — Z0000 Encounter for general adult medical examination without abnormal findings: Secondary | ICD-10-CM | POA: Diagnosis not present

## 2018-03-16 DIAGNOSIS — E785 Hyperlipidemia, unspecified: Secondary | ICD-10-CM

## 2018-03-16 NOTE — Telephone Encounter (Signed)
Orders placed  Pt advised

## 2018-03-16 NOTE — Telephone Encounter (Signed)
[place] for future order?

## 2018-03-16 NOTE — Telephone Encounter (Signed)
Ok for lipid panel, LFTs, BMP- dx hyperlipidemia

## 2018-03-16 NOTE — Patient Instructions (Signed)
Follow up in 6 months to recheck cholesterol Labs look great!  Keep up the good work! Continue to work on healthy diet and regular exercise- you're doing great! Call with any questions or concerns Happy Fall!!!

## 2018-03-16 NOTE — Assessment & Plan Note (Signed)
Pt's PE WNL.  UTD on colonoscopy, urology.  Reviewed recent labs- look good.  Anticipatory guidance provided.

## 2018-03-16 NOTE — Telephone Encounter (Signed)
Pt asked that an order be placed for labs so that he can go to our [place] location and have this done before his next appt in March for f/u to check his cholesterol.

## 2018-03-16 NOTE — Progress Notes (Signed)
   Subjective:    Patient ID: Jerry Simpson, male    DOB: 1945-10-04, 72 y.o.   MRN: [id number]  HPI CPE- UTD on colonoscopy, urology, immunizations.  Recent lab work reviewed- looks good.   Review of Systems Patient reports no vision/hearing changes, anorexia, fever ,adenopathy, persistant/recurrent hoarseness, swallowing issues, chest pain, palpitations, edema, persistant/recurrent cough, hemoptysis, dyspnea (rest,exertional, paroxysmal nocturnal), gastrointestinal  bleeding (melena, rectal bleeding), abdominal pain, excessive heart burn, GU symptoms (dysuria, hematuria, voiding/incontinence issues) syncope, focal weakness, memory loss, numbness & tingling, skin/hair/nail changes, depression, anxiety, abnormal bruising/bleeding, musculoskeletal symptoms/signs.     Objective:   Physical Exam General Appearance:    Alert, cooperative, no distress, appears stated age  Head:    Normocephalic, without obvious abnormality, atraumatic  Eyes:    PERRL, conjunctiva/corneas clear, EOM's intact, fundi    benign, both eyes       Ears:    Normal TM's and external ear canals, both ears  Nose:   Nares normal, septum midline, mucosa normal, no drainage   or sinus tenderness  Throat:   Lips, mucosa, and tongue normal; teeth and gums normal  Neck:   Supple, symmetrical, trachea midline, no adenopathy;       thyroid:  No enlargement/tenderness/nodules  Back:     Symmetric, no curvature, ROM normal, no CVA tenderness  Lungs:     Clear to auscultation bilaterally, respirations unlabored  Chest wall:    No tenderness or deformity  Heart:    Regular rate and rhythm, S1 and S2 normal, no murmur, rub   or gallop  Abdomen:     Soft, non-tender, bowel sounds active all four quadrants,    no masses, no organomegaly  Genitalia:    Deferred to urology  Rectal:    Extremities:   Extremities normal, atraumatic, no cyanosis or edema  Pulses:   2+ and symmetric all extremities  Skin:   Skin color, texture,  turgor normal, no rashes or lesions  Lymph nodes:   Cervical, supraclavicular, and axillary nodes normal  Neurologic:   CNII-XII intact. Normal strength, sensation and reflexes      throughout          Assessment & Plan:

## 2018-04-06 ENCOUNTER — Ambulatory Visit (INDEPENDENT_AMBULATORY_CARE_PROVIDER_SITE_OTHER): Payer: Medicare HMO | Admitting: Family Medicine

## 2018-04-06 ENCOUNTER — Encounter: Payer: Self-pay | Admitting: Family Medicine

## 2018-04-06 ENCOUNTER — Other Ambulatory Visit: Payer: Self-pay

## 2018-04-06 VITALS — BP 140/84 | HR 80 | Temp 98.1°F | Resp 17 | Ht 74.0 in | Wt 215.1 lb

## 2018-04-06 DIAGNOSIS — R05 Cough: Secondary | ICD-10-CM

## 2018-04-06 DIAGNOSIS — R059 Cough, unspecified: Secondary | ICD-10-CM

## 2018-04-06 MED ORDER — PREDNISONE 10 MG PO TABS: ORAL_TABLET | ORAL | 0 refills | Status: DC

## 2018-04-06 NOTE — Progress Notes (Signed)
   Subjective:    Patient ID: Jerry Simpson, male    DOB: 1945-10-14, 72 y.o.   MRN: [id number]  HPI Cough- 'it seems like a sinus infxn'.  + drainage, cough.  Taking Zyrtec, Flonase, and nasal rinse.  Cough is productive, causing sore throat.  'short course of abx usually knocks it out'.  Subjective fever last night.  Denies sinus pain/pressure.  No ear pain.  + HA.  No tooth pain.  No N/V.  + sick contacts.  Cough has been persistent but pt started feeling poorly over the last 4-5 days.   Review of Systems For ROS see HPI     Objective:   Physical Exam  Constitutional: He appears well-developed and well-nourished. No distress.  HENT:  Head: Normocephalic and atraumatic.  No TTP over sinuses + turbinate edema + PND TMs normal bilaterally  Eyes: Pupils are equal, round, and reactive to light. Conjunctivae and EOM are normal.  Neck: Normal range of motion. Neck supple.  Cardiovascular: Normal rate, regular rhythm and normal heart sounds.  Pulmonary/Chest: Effort normal and breath sounds normal. No respiratory distress. He has no wheezes.  No cough heard  Lymphadenopathy:    He has no cervical adenopathy.  Skin: Skin is warm and dry.  Vitals reviewed.         Assessment & Plan:  Cough- new.  Suspect this is a combination of seasonal allergies and recent viral illness.  No evidence of bacterial infxn to warrant abx use.  Continue Zyrtec, Flonase, and add Prednisone to symptomatically improve cough.  Reviewed supportive care and red flags that should prompt return.  Pt expressed understanding and is in agreement w/ plan.

## 2018-04-06 NOTE — Patient Instructions (Signed)
Follow up as needed or as scheduled CONTINUE the daily antihistamine CONTINUE the Flonase daily ADD the Prednisone as directed- take w/ food DRINK plenty of fluids REST! USE the codeine cough syrup as needed Call with any questions or concerns Hang in there!!

## 2018-04-10 ENCOUNTER — Other Ambulatory Visit: Payer: Self-pay | Admitting: General Practice

## 2018-04-10 MED ORDER — CLARITHROMYCIN 500 MG PO TABS: 500.0000 mg | ORAL_TABLET | Freq: Two times a day (BID) | ORAL | 0 refills | Status: DC

## 2018-05-11 ENCOUNTER — Other Ambulatory Visit: Payer: Self-pay

## 2018-05-11 ENCOUNTER — Ambulatory Visit (INDEPENDENT_AMBULATORY_CARE_PROVIDER_SITE_OTHER): Payer: Medicare HMO | Admitting: Physician Assistant

## 2018-05-11 ENCOUNTER — Telehealth: Payer: Self-pay

## 2018-05-11 ENCOUNTER — Encounter: Payer: Self-pay | Admitting: Physician Assistant

## 2018-05-11 VITALS — BP 158/82 | HR 85 | Temp 98.1°F | Resp 16 | Ht 74.0 in | Wt 215.0 lb

## 2018-05-11 DIAGNOSIS — R05 Cough: Secondary | ICD-10-CM

## 2018-05-11 DIAGNOSIS — R059 Cough, unspecified: Secondary | ICD-10-CM

## 2018-05-11 MED ORDER — CLARITHROMYCIN 500 MG PO TABS: 500.0000 mg | ORAL_TABLET | Freq: Two times a day (BID) | ORAL | 0 refills | Status: DC

## 2018-05-11 MED ORDER — AZITHROMYCIN 250 MG PO TABS: ORAL_TABLET | ORAL | 0 refills | Status: DC

## 2018-05-11 NOTE — Telephone Encounter (Signed)
Copied from [place] [id number]. Topic: General - Other >> May 11, 2018 11:14 AM Yvette Rack wrote: Reason for CRM: pt calling wanting to know if he can get a flu shot he has had a cough for 2 months he just left seeing Einar Pheasant pt is at the store and wanted to know if he can turn around to get flu shot

## 2018-05-11 NOTE — Progress Notes (Signed)
Patient presents to clinic today c/o ongoing cough associated with nasal drainage and sinus pressure. Notes mild headache. Denies fever, chills, aches, nausea/vomiting.  Denies notes heart burn or indigestion. Notes cough worse at night and in the morning.  Is taking allergy medications as directed. Symptoms have been present x 4 weeks and did not improve with steroids given by PCP at time of last visit.    Past Medical History:  Diagnosis Date  . Adenomatous polyps   . Allergy   . BPH (benign prostatic hyperplasia)    elevated PSA, Dr Jeffie Pollock  . CAD (coronary artery disease)   . Cataract    removed both eyes   . Environmental allergies   . GERD (gastroesophageal reflux disease)   . Hyperlipidemia     Current Outpatient Medications on File Prior to Visit  Medication Sig Dispense Refill  . ASPIRIN 81 PO Take by mouth.    . cetirizine (ZYRTEC) 10 MG tablet Take 10 mg by mouth daily.    . fexofenadine (ALLEGRA) 180 MG tablet Take 180 mg by mouth daily as needed for allergies.     . fluticasone (FLONASE) 50 MCG/ACT nasal spray Place 2 sprays into both nostrils daily. (Patient taking differently: Place 2 sprays into both nostrils daily as needed for allergies. ) 16 g 1  . Ibuprofen (ADVIL) 200 MG CAPS Take 1 capsule by mouth at bedtime.    . pantoprazole (PROTONIX) 40 MG tablet Take 1 tablet (40 mg total) by mouth daily. (Patient taking differently: Take 40 mg by mouth daily as needed (indigestion). ) 90 tablet 1  . Red Yeast Rice Extract (RED YEAST RICE PO) Take 2 tablets by mouth daily.     . clarithromycin (BIAXIN) 500 MG tablet Take 1 tablet (500 mg total) by mouth 2 (two) times daily. (Patient not taking: Reported on 05/11/2018) 14 tablet 0   No current facility-administered medications on file prior to visit.     Allergies  Allergen Reactions  . Sulfonamide Derivatives Rash    RASH  Because of a history of documented adverse serious drug reaction;Medi Alert bracelet  is  recommended  . Claritin [Loratadine]     Pt states it affects his prostate.     Family History  Problem Relation Age of Onset  . Hypertension Father   . Heart attack Father 25  . Diabetes Father   . Cancer Brother        lung cancer ; leukemia terminally  . Heart attack Brother 56  . Heart disease Paternal Uncle 75  . Stroke Maternal Grandmother        >65  . Colon cancer Neg Hx   . Stomach cancer Neg Hx   . Colon polyps Neg Hx   . Esophageal cancer Neg Hx   . Rectal cancer Neg Hx     Social History   Socioeconomic History  . Marital status: Married    Spouse name: Not on file  . Number of children: Not on file  . Years of education: Not on file  . Highest education level: Not on file  Occupational History  . Not on file  Social Needs  . Financial resource strain: Not on file  . Food insecurity:    Worry: Not on file    Inability: Not on file  . Transportation needs:    Medical: Not on file    Non-medical: Not on file  Tobacco Use  . Smoking status: Never Smoker  . Smokeless tobacco: Never  Used  Substance and Sexual Activity  . Alcohol use: Yes    Alcohol/week: 7.0 standard drinks    Types: 7 drink(s) per week    Comment:  < 7/ week  . Drug use: No  . Sexual activity: Not on file  Lifestyle  . Physical activity:    Days per week: Not on file    Minutes per session: Not on file  . Stress: Not on file  Relationships  . Social connections:    Talks on phone: Not on file    Gets together: Not on file    Attends religious service: Not on file    Active member of club or organization: Not on file    Attends meetings of clubs or organizations: Not on file    Relationship status: Not on file  Other Topics Concern  . Not on file  Social History Narrative  . Not on file   Review of Systems - See HPI.  All other ROS are negative.  There were no vitals taken for this visit.  Physical Exam  Constitutional: He appears well-developed and well-nourished.    HENT:  Head: Normocephalic and atraumatic.  Right Ear: External ear normal.  Left Ear: External ear normal.  Nose: Nose normal.  Mouth/Throat: Oropharynx is clear and moist. No oropharyngeal exudate.  TM within normal limits bilaterally   Eyes: Pupils are equal, round, and reactive to light. Conjunctivae and EOM are normal.  Neck: Neck supple.  Cardiovascular: Normal rate, regular rhythm, normal heart sounds and intact distal pulses.  Pulmonary/Chest: Effort normal and breath sounds normal.  Lymphadenopathy:    He has no cervical adenopathy.  Vitals reviewed.   Recent Results (from the past 2160 hour(s))  TSH     Status: None   Collection Time: 03/14/18  9:14 AM  Result Value Ref Range   TSH 1.92 0.35 - 4.50 uIU/mL  PSA     Status: None   Collection Time: 03/14/18  9:14 AM  Result Value Ref Range   PSA 0.99 0.10 - 4.00 ng/mL    Comment: Test performed using Access Hybritech PSA Assay, a parmagnetic partical, chemiluminecent immunoassay.  Lipid panel     Status: Abnormal   Collection Time: 03/14/18  9:14 AM  Result Value Ref Range   Cholesterol 175 0 - 200 mg/dL    Comment: ATP III Classification       Desirable:  < 200 mg/dL               Borderline High:  200 - 239 mg/dL          High:  > = 240 mg/dL   Triglycerides 169.0 (H) 0.0 - 149.0 mg/dL    Comment: Normal:  <150 mg/dLBorderline High:  150 - 199 mg/dL   HDL 28.40 (L) >39.00 mg/dL   VLDL 33.8 0.0 - 40.0 mg/dL   LDL Cholesterol 113 (H) 0 - 99 mg/dL   Total CHOL/HDL Ratio 6     Comment:                Men          Women1/2 Average Risk     3.4          3.3Average Risk          5.0          4.42X Average Risk          9.6          7.13X Average Risk  15.0          11.0                       NonHDL 146.72     Comment: NOTE:  Non-HDL goal should be 30 mg/dL higher than patient's LDL goal (i.e. LDL goal of < 70 mg/dL, would have non-HDL goal of < 100 mg/dL)  Hepatic function panel     Status: None   Collection  Time: 03/14/18  9:14 AM  Result Value Ref Range   Total Bilirubin 0.6 0.2 - 1.2 mg/dL   Bilirubin, Direct 0.1 0.0 - 0.3 mg/dL   Alkaline Phosphatase 45 39 - 117 U/L   AST 16 0 - 37 U/L   ALT 15 0 - 53 U/L   Total Protein 7.0 6.0 - 8.3 g/dL   Albumin 4.3 3.5 - 5.2 g/dL  CBC with Differential/Platelet     Status: None   Collection Time: 03/14/18  9:14 AM  Result Value Ref Range   WBC 5.5 4.0 - 10.5 K/uL   RBC 5.15 4.22 - 5.81 Mil/uL   Hemoglobin 14.7 13.0 - 17.0 g/dL   HCT 43.7 39.0 - 52.0 %   MCV 84.8 78.0 - 100.0 fl   MCHC 33.7 30.0 - 36.0 g/dL   RDW 14.1 11.5 - 15.5 %   Platelets 224.0 150.0 - 400.0 K/uL   Neutrophils Relative % 61.1 43.0 - 77.0 %   Lymphocytes Relative 27.1 12.0 - 46.0 %   Monocytes Relative 7.6 3.0 - 12.0 %   Eosinophils Relative 2.9 0.0 - 5.0 %   Basophils Relative 1.3 0.0 - 3.0 %   Neutro Abs 3.4 1.4 - 7.7 K/uL   Lymphs Abs 1.5 0.7 - 4.0 K/uL   Monocytes Absolute 0.4 0.1 - 1.0 K/uL   Eosinophils Absolute 0.2 0.0 - 0.7 K/uL   Basophils Absolute 0.1 0.0 - 0.1 K/uL  Basic metabolic panel     Status: Abnormal   Collection Time: 03/14/18  9:14 AM  Result Value Ref Range   Sodium 138 135 - 145 mEq/L   Potassium 4.1 3.5 - 5.1 mEq/L   Chloride 103 96 - 112 mEq/L   CO2 30 19 - 32 mEq/L   Glucose, Bld 108 (H) 70 - 99 mg/dL   BUN 19 6 - 23 mg/dL   Creatinine, Ser 1.01 0.40 - 1.50 mg/dL   Calcium 9.0 8.4 - 10.5 mg/dL   GFR 77.56 >60.00 mL/min    Assessment/Plan: 1. Cough Continue allergy medications. Supportive measures and OTC meds reviewed. Rx Azithromycin. If not improving, will need to consider potential of silent reflux as contributor.  - azithromycin (ZITHROMAX) 250 MG tablet; Take 2 tablets on Day 1. Then take 1 tablet daily.  Dispense: 6 tablet; Refill: 0   Leeanne Rio, PA-C

## 2018-05-11 NOTE — Telephone Encounter (Signed)
Pt. Stated he will get his flu shot at [facility] after he finishes his Z-Pac. I instructed pt to call back and let us know when he gets his flu shot so that we can record in his immunization records.

## 2018-05-11 NOTE — Patient Instructions (Signed)
Please keep well-hydrated and get plenty of rest.  Continue chronic allergy medications.  Take the Clarithromycin as directed. Albuterol as needed. Please continue the Guaifenesin and Dextromethorphan for cough and congestion.  If not improving with antibiotic please restart the Pantoprazole in case of silent reflux.  Hang in there.  Keep [place] updated on how you are doing.

## 2018-05-11 NOTE — Telephone Encounter (Signed)
I would have him complete course of antibiotic first cause if this has been infectious cough, getting the flu shot may make it take longer for him to recuperate.

## 2018-05-17 ENCOUNTER — Telehealth: Payer: Self-pay | Admitting: Family Medicine

## 2018-05-17 MED ORDER — DOXYCYCLINE HYCLATE 100 MG PO CAPS
100.0000 mg | ORAL_CAPSULE | Freq: Two times a day (BID) | ORAL | 0 refills | Status: DC
Start: 2018-05-17 — End: 2019-04-11

## 2018-05-17 NOTE — Telephone Encounter (Signed)
Advised patient of the note below.  Patient stated verbal understanding.

## 2018-05-17 NOTE — Telephone Encounter (Signed)
I have sent in an Rx for Doxycycline for 7-day course.  If not resolving after this, will need repeat assessment with his PCP.

## 2018-05-17 NOTE — Telephone Encounter (Signed)
Copied from [place] [id number]. Topic: Quick Communication - See Telephone Encounter >> May 17, 2018 12:43 PM Conception Chancy, NT wrote: CRM for notification. See Telephone encounter for: 05/17/18.  Patient is calling and states he was seen on 05/11/18 by Raiford Noble. Patient states he finished the azithromycin (ZITHROMAX) 250 MG tablet and states he felt better while he was taking that but all of his symptoms have came back, congestion and cough since he has finished it. He would like to know if this can be refilled or if something else can be called in.  Kristopher Oppenheim [facility] [address] [place] [address] Phone: [phone] Fax: [phone]

## 2018-05-23 ENCOUNTER — Telehealth: Payer: Self-pay | Admitting: Family Medicine

## 2018-05-23 ENCOUNTER — Encounter: Payer: Self-pay | Admitting: Physician Assistant

## 2018-05-23 ENCOUNTER — Other Ambulatory Visit (INDEPENDENT_AMBULATORY_CARE_PROVIDER_SITE_OTHER): Payer: Medicare HMO

## 2018-05-23 DIAGNOSIS — D649 Anemia, unspecified: Secondary | ICD-10-CM

## 2018-05-23 DIAGNOSIS — E785 Hyperlipidemia, unspecified: Secondary | ICD-10-CM

## 2018-05-23 LAB — BASIC METABOLIC PANEL
BUN: 16 mg/dL (ref 6–23)
CHLORIDE: 101 meq/L (ref 96–112)
CO2: 28 mEq/L (ref 19–32)
Calcium: 9.2 mg/dL (ref 8.4–10.5)
Creatinine, Ser: 1.1 mg/dL (ref 0.40–1.50)
GFR: 70.24 mL/min (ref >60.00)
Glucose, Bld: 115 mg/dL — ABNORMAL HIGH (ref 70–99)
POTASSIUM: 4.2 meq/L (ref 3.5–5.1)
Sodium: 137 mEq/L (ref 135–145)

## 2018-05-23 LAB — HEPATIC FUNCTION PANEL
ALT: 17 U/L (ref 0–53)
AST: 16 U/L (ref 0–37)
Albumin: 4.3 g/dL (ref 3.5–5.2)
Alkaline Phosphatase: 53 U/L (ref 39–117)
BILIRUBIN DIRECT: 0.1 mg/dL (ref 0.0–0.3)
BILIRUBIN TOTAL: 0.6 mg/dL (ref 0.2–1.2)
TOTAL PROTEIN: 6.9 g/dL (ref 6.0–8.3)

## 2018-05-23 LAB — CBC WITH DIFFERENTIAL/PLATELET
BASOS ABS: 0.1 10*3/uL (ref 0.0–0.1)
Basophils Relative: 1 % (ref 0.0–3.0)
EOS ABS: 0.2 10*3/uL (ref 0.0–0.7)
Eosinophils Relative: 2 % (ref 0.0–5.0)
HEMATOCRIT: 45.5 % (ref 39.0–52.0)
Hemoglobin: 15.4 g/dL (ref 13.0–17.0)
LYMPHS PCT: 27.8 % (ref 12.0–46.0)
Lymphs Abs: 2.2 10*3/uL (ref 0.7–4.0)
MCHC: 33.8 g/dL (ref 30.0–36.0)
MCV: 86.2 fl (ref 78.0–100.0)
MONOS PCT: 7.2 % (ref 3.0–12.0)
Monocytes Absolute: 0.6 10*3/uL (ref 0.1–1.0)
Neutro Abs: 4.8 10*3/uL (ref 1.4–7.7)
Neutrophils Relative %: 62 % (ref 43.0–77.0)
PLATELETS: 285 10*3/uL (ref 150.0–400.0)
RBC: 5.29 Mil/uL (ref 4.22–5.81)
RDW: 14.2 % (ref 11.5–15.5)
WBC: 7.8 10*3/uL (ref 4.0–10.5)

## 2018-05-23 LAB — LIPID PANEL
CHOL/HDL RATIO: 7
Cholesterol: 160 mg/dL (ref 0–200)
HDL: 24.6 mg/dL — ABNORMAL LOW (ref >39.00)
NonHDL: 135.88
Triglycerides: 278 mg/dL — ABNORMAL HIGH (ref 0.0–149.0)
VLDL: 55.6 mg/dL — ABNORMAL HIGH (ref 0.0–40.0)

## 2018-05-23 LAB — IRON: Iron: 84 ug/dL (ref 42–165)

## 2018-05-23 LAB — LDL CHOLESTEROL, DIRECT: LDL DIRECT: 87 mg/dL

## 2018-05-23 NOTE — Telephone Encounter (Signed)
Pt hemoglobin was normal 2 months ago. He did not have a CBC drawn today.

## 2018-05-23 NOTE — Telephone Encounter (Signed)
Please let pt know that he had normal hemoglobin 2 months ago.  We can run a repeat CBC and iron panel at a lab only visit (low hemoglobin) to ensure normal levels

## 2018-05-23 NOTE — Telephone Encounter (Signed)
Copied from [place] [phone]. Topic: General - Other >> May 23, 2018  9:32 AM Yvette Rack wrote: Reason for CRM: Pt stated he had labs drawn and requested that his iron be checked since he was told at [facility] that it was low. Pt stated he needs Dr. Birdie Riddle to send a request to the lab approving that his iron to be tested.

## 2018-05-23 NOTE — Telephone Encounter (Signed)
Per Joellen Jersey these could be run off of blood from today. Orders were placed.

## 2018-05-27 DIAGNOSIS — R69 Illness, unspecified: Secondary | ICD-10-CM | POA: Diagnosis not present

## 2018-07-12 DIAGNOSIS — Z1283 Encounter for screening for malignant neoplasm of skin: Secondary | ICD-10-CM | POA: Diagnosis not present

## 2018-07-12 DIAGNOSIS — L820 Inflamed seborrheic keratosis: Secondary | ICD-10-CM | POA: Diagnosis not present

## 2018-07-12 DIAGNOSIS — L821 Other seborrheic keratosis: Secondary | ICD-10-CM | POA: Diagnosis not present

## 2018-09-13 ENCOUNTER — Ambulatory Visit: Payer: Medicare HMO | Admitting: Family Medicine

## 2018-11-12 ENCOUNTER — Other Ambulatory Visit: Payer: Self-pay | Admitting: Family Medicine

## 2018-11-22 DIAGNOSIS — R69 Illness, unspecified: Secondary | ICD-10-CM | POA: Diagnosis not present

## 2018-12-27 DIAGNOSIS — R69 Illness, unspecified: Secondary | ICD-10-CM | POA: Diagnosis not present

## 2019-03-09 DIAGNOSIS — R69 Illness, unspecified: Secondary | ICD-10-CM | POA: Diagnosis not present

## 2019-04-11 ENCOUNTER — Ambulatory Visit (INDEPENDENT_AMBULATORY_CARE_PROVIDER_SITE_OTHER): Payer: Medicare HMO | Admitting: Physician Assistant

## 2019-04-11 ENCOUNTER — Encounter: Payer: Self-pay | Admitting: Physician Assistant

## 2019-04-11 ENCOUNTER — Other Ambulatory Visit: Payer: Self-pay

## 2019-04-11 VITALS — Temp 97.6°F

## 2019-04-11 DIAGNOSIS — J310 Chronic rhinitis: Secondary | ICD-10-CM | POA: Diagnosis not present

## 2019-04-11 NOTE — Progress Notes (Signed)
Virtual Visit via Video   I connected with patient on 04/11/19 at  2:30 PM EDT by a video enabled telemedicine application and verified that I am speaking with the correct person using two identifiers.  Location patient: Home Location provider: [facility], Office Persons participating in the virtual visit: Patient, Provider, [place] (Patina Moore)  I discussed the limitations of evaluation and management by telemedicine and the availability of in person appointments. The patient expressed understanding and agreed to proceed.  Subjective:   HPI:   Patient with history of chronic rhinitis presents via [facility] today c/o 2 months of cough that is sometimes productive of clear phlegm, worse in the morning. Notes some associated post-nasal drip and nasal congestion. Denies rhinorrhea, watery/itchy eyes. Denies fever, chills, malaise. Denies chest congestion. Denies sinus pain, ear pain or tooth pain. Has been taking Allegra but switched to Xyzal with some improvement. Restarted Flonase and Mucinex this morning with improvement in symptoms.   Denies recent travel or sick contact. Denies COVID exposure.    ROS:   See pertinent positives and negatives per HPI.  Patient Active Problem List   Diagnosis Date Noted  . Upper airway cough syndrome 06/12/2015  . Physical exam 07/09/2014  . Sleep apnea 07/09/2014  . Bladder tumor   . Reactive airway disease that is not asthma 05/13/2014  . Blood donor 07/04/2013  . HYPERGLYCEMIA, FASTING 07/16/2010  . Coronary atherosclerosis 10/16/2008  . BENIGN PROSTATIC HYPERTROPHY 10/16/2008  . SKIN CANCER, HX OF 10/16/2008  . COLONIC POLYPS, HX OF 10/16/2008  . ELEVATED PROSTATE SPECIFIC ANTIGEN 06/05/2008  . ELEVATED BLOOD PRESSURE WITHOUT DIAGNOSIS OF HYPERTENSION 06/05/2008  . Hyperlipidemia 09/27/2007  . GERD 06/20/2007  . Allergic rhinitis, cause unspecified 05/05/2007    Social History   Tobacco Use  . Smoking status: Never Smoker  .  Smokeless tobacco: Never Used  Substance Use Topics  . Alcohol use: Yes    Alcohol/week: 7.0 standard drinks    Types: 7 drink(s) per week    Comment:  < 7/ week    Current Outpatient Medications:  .  ASPIRIN 81 PO, Take by mouth., Disp: , Rfl:  .  cetirizine (ZYRTEC) 10 MG tablet, Take 10 mg by mouth daily., Disp: , Rfl:  .  doxycycline (VIBRAMYCIN) 100 MG capsule, Take 1 capsule (100 mg total) by mouth 2 (two) times daily., Disp: 14 capsule, Rfl: 0 .  fexofenadine (ALLEGRA) 180 MG tablet, Take 180 mg by mouth daily as needed for allergies. , Disp: , Rfl:  .  fluticasone (FLONASE) 50 MCG/ACT nasal spray, Place 2 sprays into both nostrils daily. (Patient taking differently: Place 2 sprays into both nostrils daily as needed for allergies. ), Disp: 16 g, Rfl: 1 .  Ibuprofen (ADVIL) 200 MG CAPS, Take 1 capsule by mouth at bedtime., Disp: , Rfl:  .  pantoprazole (PROTONIX) 40 MG tablet, Take 1 tablet by mouth once daily, Disp: 90 tablet, Rfl: 0 .  Red Yeast Rice Extract (RED YEAST RICE PO), Take 2 tablets by mouth daily. , Disp: , Rfl:   Allergies  Allergen Reactions  . Sulfonamide Derivatives Rash    RASH  Because of a history of documented adverse serious drug reaction;Medi Alert bracelet  is recommended  . Claritin [Loratadine]     Pt states it affects his prostate.     Objective:   There were no vitals taken for this visit.  Patient is well-developed, well-nourished in no acute distress.  Resting comfortably at home.  Head is normocephalic, atraumatic.  No labored breathing.  Speech is clear and coherent with logical content.  Patient is alert and oriented at baseline.   Assessment and Plan:   1. Chronic rhinitis Causing PND with AM clear phlegm. Continue Xyzal along with Flonase. Mucinex BID. Start nightly saline nasal rinses. Consider air purifier, at least for bedroom. If still having issue with these measures will consider addition of singulair. May need in-office  assessment at that point.    Leeanne Rio, PA-C 04/11/2019

## 2019-04-11 NOTE — Patient Instructions (Signed)
Please keep hydrated and get plenty of rest.  Continue the Mucinex as directed, twice daily. Continue the Flonase once daily in the morning. Use a saline nasal rinse each evening when getting ready for bed.  Run a humidifier in the bedroom. If you have pets in the house, keep them out of the bedroom and off of furniture.   Follow-up if symptoms are not resolving.

## 2019-04-11 NOTE — Progress Notes (Signed)
I have discussed the procedure for the virtual visit with the patient who has given consent to proceed with assessment and treatment.   Keller Mikels S Zyanna Leisinger, CMA     

## 2019-05-01 DIAGNOSIS — L820 Inflamed seborrheic keratosis: Secondary | ICD-10-CM | POA: Diagnosis not present

## 2019-05-01 DIAGNOSIS — D225 Melanocytic nevi of trunk: Secondary | ICD-10-CM | POA: Diagnosis not present

## 2019-05-01 DIAGNOSIS — Z1283 Encounter for screening for malignant neoplasm of skin: Secondary | ICD-10-CM | POA: Diagnosis not present

## 2019-05-21 ENCOUNTER — Encounter: Payer: Self-pay | Admitting: Family Medicine

## 2019-05-21 DIAGNOSIS — R6882 Decreased libido: Secondary | ICD-10-CM

## 2019-05-21 DIAGNOSIS — E785 Hyperlipidemia, unspecified: Secondary | ICD-10-CM

## 2019-05-22 ENCOUNTER — Telehealth: Payer: Self-pay | Admitting: Family Medicine

## 2019-05-22 ENCOUNTER — Other Ambulatory Visit (INDEPENDENT_AMBULATORY_CARE_PROVIDER_SITE_OTHER): Payer: Medicare HMO

## 2019-05-22 DIAGNOSIS — E785 Hyperlipidemia, unspecified: Secondary | ICD-10-CM

## 2019-05-22 DIAGNOSIS — R6882 Decreased libido: Secondary | ICD-10-CM | POA: Diagnosis not present

## 2019-05-22 DIAGNOSIS — Z125 Encounter for screening for malignant neoplasm of prostate: Secondary | ICD-10-CM

## 2019-05-22 DIAGNOSIS — R69 Illness, unspecified: Secondary | ICD-10-CM | POA: Diagnosis not present

## 2019-05-22 LAB — HEPATIC FUNCTION PANEL
ALT: 22 U/L (ref 0–53)
AST: 20 U/L (ref 0–37)
Albumin: 4.5 g/dL (ref 3.5–5.2)
Alkaline Phosphatase: 59 U/L (ref 39–117)
Bilirubin, Direct: 0.1 mg/dL (ref 0.0–0.3)
Total Bilirubin: 0.7 mg/dL (ref 0.2–1.2)
Total Protein: 7.3 g/dL (ref 6.0–8.3)

## 2019-05-22 LAB — BASIC METABOLIC PANEL
BUN: 17 mg/dL (ref 6–23)
CO2: 30 mEq/L (ref 19–32)
Calcium: 9.5 mg/dL (ref 8.4–10.5)
Chloride: 101 mEq/L (ref 96–112)
Creatinine, Ser: 1.08 mg/dL (ref 0.40–1.50)
GFR: 67.31 mL/min (ref >60.00)
Glucose, Bld: 118 mg/dL — ABNORMAL HIGH (ref 70–99)
Potassium: 4.6 mEq/L (ref 3.5–5.1)
Sodium: 138 mEq/L (ref 135–145)

## 2019-05-22 LAB — LIPID PANEL
Cholesterol: 195 mg/dL (ref 0–200)
HDL: 30 mg/dL — ABNORMAL LOW (ref >39.00)
NonHDL: 165.17
Total CHOL/HDL Ratio: 7
Triglycerides: 238 mg/dL — ABNORMAL HIGH (ref 0.0–149.0)
VLDL: 47.6 mg/dL — ABNORMAL HIGH (ref 0.0–40.0)

## 2019-05-22 LAB — CBC WITH DIFFERENTIAL/PLATELET
Basophils Absolute: 0.1 10*3/uL (ref 0.0–0.1)
Basophils Relative: 1.3 % (ref 0.0–3.0)
Eosinophils Absolute: 0.1 10*3/uL (ref 0.0–0.7)
Eosinophils Relative: 1.9 % (ref 0.0–5.0)
HCT: 45.6 % (ref 39.0–52.0)
Hemoglobin: 15.5 g/dL (ref 13.0–17.0)
Lymphocytes Relative: 33 % (ref 12.0–46.0)
Lymphs Abs: 2.3 10*3/uL (ref 0.7–4.0)
MCHC: 34 g/dL (ref 30.0–36.0)
MCV: 86.2 fl (ref 78.0–100.0)
Monocytes Absolute: 0.6 10*3/uL (ref 0.1–1.0)
Monocytes Relative: 8.9 % (ref 3.0–12.0)
Neutro Abs: 3.8 10*3/uL (ref 1.4–7.7)
Neutrophils Relative %: 54.9 % (ref 43.0–77.0)
Platelets: 240 10*3/uL (ref 150.0–400.0)
RBC: 5.29 Mil/uL (ref 4.22–5.81)
RDW: 13.2 % (ref 11.5–15.5)
WBC: 7 10*3/uL (ref 4.0–10.5)

## 2019-05-22 LAB — TSH: TSH: 2.24 u[IU]/mL (ref 0.35–4.50)

## 2019-05-22 LAB — LDL CHOLESTEROL, DIRECT: Direct LDL: 126 mg/dL

## 2019-05-22 LAB — PSA: PSA: 2.02 ng/mL (ref 0.10–4.00)

## 2019-05-22 LAB — TESTOSTERONE: Testosterone: 412.6 ng/dL (ref 300.00–890.00)

## 2019-05-22 NOTE — Telephone Encounter (Signed)
Ok for medicare PSA- dx prostate cancer screen (but he follows w/ urology)

## 2019-05-22 NOTE — Telephone Encounter (Signed)
Pt had labs done today at [facility] and wanted his PSA checked as well . Lab just needs to be placed

## 2019-05-22 NOTE — Telephone Encounter (Signed)
Ok for lab

## 2019-05-22 NOTE — Telephone Encounter (Signed)
These labs were ordered.

## 2019-05-24 ENCOUNTER — Encounter: Payer: Self-pay | Admitting: Family Medicine

## 2019-05-24 ENCOUNTER — Ambulatory Visit (INDEPENDENT_AMBULATORY_CARE_PROVIDER_SITE_OTHER): Payer: Medicare HMO | Admitting: Family Medicine

## 2019-05-24 ENCOUNTER — Other Ambulatory Visit: Payer: Self-pay

## 2019-05-24 VITALS — BP 131/81 | HR 71 | Temp 97.9°F | Resp 16 | Ht 74.0 in | Wt 213.2 lb

## 2019-05-24 DIAGNOSIS — E785 Hyperlipidemia, unspecified: Secondary | ICD-10-CM

## 2019-05-24 DIAGNOSIS — R7309 Other abnormal glucose: Secondary | ICD-10-CM | POA: Diagnosis not present

## 2019-05-24 DIAGNOSIS — Z0000 Encounter for general adult medical examination without abnormal findings: Secondary | ICD-10-CM

## 2019-05-24 MED ORDER — ROSUVASTATIN CALCIUM 10 MG PO TABS: 10.0000 mg | ORAL_TABLET | Freq: Every day | ORAL | 3 refills | Status: DC

## 2019-05-24 NOTE — Assessment & Plan Note (Signed)
Chronic problem.  Red Yeast Rice is not controlling levels at this time.  Pt is willing to try low dose Crestor.  will repeat LFTs at lab only visit in 6-8 weeks.

## 2019-05-24 NOTE — Assessment & Plan Note (Signed)
Pt's PE WNL w/ exception of being overweight.  Reviewed recent labs.  UTD on colonoscopy, immunizations.  Anticipatory guidance provided.

## 2019-05-24 NOTE — Progress Notes (Signed)
   Subjective:    Patient ID: Jerry Simpson, male    DOB: July 05, 1945, 73 y.o.   MRN: [id number]  HPI Here today for MWV and CPE.  Risk Factors: Hyperlipidemia- chronic problem, on Red Yeast Rice.  Current levels are elevated.  HDL is low, LDL is higher.  Ratio is 7.  He was previously on Crestor w/o difficulty.  Willing to restart. Hyperglycemia- pt has hx of fasting hyperglycemia.  Glucose was again elevated this time. Physical Activity: no regular exercise Fall Risk: low Depression: denies Hearing: normal to conversational tones, mildly decreased to whispered voice ADL's: independent Cognitive: normal Home Safety: safe at home, lives w/ wife Height, Weight, BMI, Visual Acuity: see vitals, vision corrected to 20/20 w/ glasses Counseling: UTD on colonoscopy, immunizations Labs Ordered: See A&P Care Plan: See A&P   Patient Care Team    Relationship Specialty Notifications Start End  Midge Minium, MD PCP - General Family Medicine  04/18/14    Comment: Nestor Ramp, Docia Chuck, MD Consulting Physician Gastroenterology  07/09/14   Beryle Lathe, MD Referring Physician General Surgery  08/18/15    Comment: Urology  [facility] [place], MD Attending Physician   12/29/17    Comment: Vernona Rieger, Jenny Reichmann, MD  Dermatology  12/29/17      Review of Systems Patient reports no vision/hearing changes, anorexia, fever ,adenopathy, persistant/recurrent hoarseness, swallowing issues, chest pain, palpitations, edema, persistant/recurrent cough, hemoptysis, dyspnea (rest,exertional, paroxysmal nocturnal), gastrointestinal  bleeding (melena, rectal bleeding), abdominal pain, excessive heart burn, GU symptoms (dysuria, hematuria, voiding/incontinence issues) syncope, focal weakness, memory loss, numbness & tingling, skin/hair/nail changes, depression, anxiety, abnormal bruising/bleeding, musculoskeletal symptoms/signs.   This visit occurred during the SARS-CoV-2 public health emergency.   Safety protocols were in place, including screening questions prior to the visit, additional usage of staff PPE, and extensive cleaning of exam room while observing appropriate contact time as indicated for disinfecting solutions.       Objective:   Physical Exam General Appearance:    Alert, cooperative, no distress, appears stated age  Head:    Normocephalic, without obvious abnormality, atraumatic  Eyes:    PERRL, conjunctiva/corneas clear, EOM's intact, fundi    benign, both eyes       Ears:    Normal TM's and external ear canals, both ears  Nose:   Deferred due to COVID  Throat:   Neck:   Supple, symmetrical, trachea midline, no adenopathy;       thyroid:  No enlargement/tenderness/nodules  Back:     Symmetric, no curvature, ROM normal, no CVA tenderness  Lungs:     Clear to auscultation bilaterally, respirations unlabored  Chest wall:    No tenderness or deformity  Heart:    Regular rate and rhythm, S1 and S2 normal, no murmur, rub   or gallop  Abdomen:     Soft, non-tender, bowel sounds active all four quadrants,    no masses, no organomegaly  Genitalia:    Deferred to urology  Rectal:    Extremities:   Extremities normal, atraumatic, no cyanosis or edema  Pulses:   2+ and symmetric all extremities  Skin:   Skin color, texture, turgor normal, no rashes or lesions  Lymph nodes:   Cervical, supraclavicular, and axillary nodes normal  Neurologic:   CNII-XII intact. Normal strength, sensation and reflexes      throughout          Assessment & Plan:

## 2019-05-24 NOTE — Patient Instructions (Signed)
Follow up in 6 months to recheck cholesterol Schedule a lab visit in 6-8 weeks to recheck liver functions START the Crestor nightly Continue to work on healthy diet and regular exercise- you can do it! Call with any questions or concerns Stay Safe! Happy Holidays!   Preventive Care 71 Years and Older, Male Preventive care refers to lifestyle choices and visits with your health care provider that can promote health and wellness. This includes:  A yearly physical exam. This is also called an annual well check.  Regular dental and eye exams.  Immunizations.  Screening for certain conditions.  Healthy lifestyle choices, such as diet and exercise. What can I expect for my preventive care visit? Physical exam Your health care provider will check:  Height and weight. These may be used to calculate body mass index (BMI), which is a measurement that tells if you are at a healthy weight.  Heart rate and blood pressure.  Your skin for abnormal spots. Counseling Your health care provider may ask you questions about:  Alcohol, tobacco, and drug use.  Emotional well-being.  Home and relationship well-being.  Sexual activity.  Eating habits.  History of falls.  Memory and ability to understand (cognition).  Work and work Statistician. What immunizations do I need?  Influenza (flu) vaccine  This is recommended every year. Tetanus, diphtheria, and pertussis (Tdap) vaccine  You may need a Td booster every 10 years. Varicella (chickenpox) vaccine  You may need this vaccine if you have not already been vaccinated. Zoster (shingles) vaccine  You may need this after age 19. Pneumococcal conjugate (PCV13) vaccine  One dose is recommended after age 71. Pneumococcal polysaccharide (PPSV23) vaccine  One dose is recommended after age 71. Measles, mumps, and rubella (MMR) vaccine  You may need at least one dose of MMR if you were born in 1957 or later. You may also need a  second dose. Meningococcal conjugate (MenACWY) vaccine  You may need this if you have certain conditions. Hepatitis A vaccine  You may need this if you have certain conditions or if you travel or work in places where you may be exposed to hepatitis A. Hepatitis B vaccine  You may need this if you have certain conditions or if you travel or work in places where you may be exposed to hepatitis B. Haemophilus influenzae type b (Hib) vaccine  You may need this if you have certain conditions. You may receive vaccines as individual doses or as more than one vaccine together in one shot (combination vaccines). Talk with your health care provider about the risks and benefits of combination vaccines. What tests do I need? Blood tests  Lipid and cholesterol levels. These may be checked every 5 years, or more frequently depending on your overall health.  Hepatitis C test.  Hepatitis B test. Screening  Lung cancer screening. You may have this screening every year starting at age 71 if you have a 30-pack-year history of smoking and currently smoke or have quit within the past 15 years.  Colorectal cancer screening. All adults should have this screening starting at age 46 and continuing until age 76. Your health care provider may recommend screening at age 38 if you are at increased risk. You will have tests every 1-10 years, depending on your results and the type of screening test.  Prostate cancer screening. Recommendations will vary depending on your family history and other risks.  Diabetes screening. This is done by checking your blood sugar (glucose) after you have  not eaten for a while (fasting). You may have this done every 1-3 years.  Abdominal aortic aneurysm (AAA) screening. You may need this if you are a current or former smoker.  Sexually transmitted disease (STD) testing. Follow these instructions at home: Eating and drinking  Eat a diet that includes fresh fruits and  vegetables, whole grains, lean protein, and low-fat dairy products. Limit your intake of foods with high amounts of sugar, saturated fats, and salt.  Take vitamin and mineral supplements as recommended by your health care provider.  Do not drink alcohol if your health care provider tells you not to drink.  If you drink alcohol: ? Limit how much you have to 0-2 drinks a day. ? Be aware of how much alcohol is in your drink. In the [place] one drink equals one 12 oz bottle of beer (355 mL), one 5 oz glass of wine (148 mL), or one 1 oz glass of hard liquor (44 mL). Lifestyle  Take daily care of your teeth and gums.  Stay active. Exercise for at least 30 minutes on 5 or more days each week.  Do not use any products that contain nicotine or tobacco, such as cigarettes, e-cigarettes, and chewing tobacco. If you need help quitting, ask your health care provider.  If you are sexually active, practice safe sex. Use a condom or other form of protection to prevent STIs (sexually transmitted infections).  Talk with your health care provider about taking a low-dose aspirin or statin. What's next?  Visit your health care provider once a year for a well check visit.  Ask your health care provider how often you should have your eyes and teeth checked.  Stay up to date on all vaccines. This information is not intended to replace advice given to you by your health care provider. Make sure you discuss any questions you have with your health care provider. Document Released: 07/04/2015 Document Revised: 06/01/2018 Document Reviewed: 06/01/2018 Elsevier Patient Education  2020 [employer].

## 2019-05-24 NOTE — Assessment & Plan Note (Signed)
Pt has hx of this but has always had normal A1C.  Will repeat A1C today to determine if he is progressing to diabetes.  Pt expressed understanding and is in agreement w/ plan.

## 2019-05-29 DIAGNOSIS — H26493 Other secondary cataract, bilateral: Secondary | ICD-10-CM | POA: Diagnosis not present

## 2019-05-29 DIAGNOSIS — H52203 Unspecified astigmatism, bilateral: Secondary | ICD-10-CM | POA: Diagnosis not present

## 2019-05-29 DIAGNOSIS — H35372 Puckering of macula, left eye: Secondary | ICD-10-CM | POA: Diagnosis not present

## 2019-05-29 DIAGNOSIS — H524 Presbyopia: Secondary | ICD-10-CM | POA: Diagnosis not present

## 2019-06-18 DIAGNOSIS — R310 Gross hematuria: Secondary | ICD-10-CM | POA: Diagnosis not present

## 2019-06-18 DIAGNOSIS — N410 Acute prostatitis: Secondary | ICD-10-CM | POA: Diagnosis not present

## 2019-07-16 DIAGNOSIS — R310 Gross hematuria: Secondary | ICD-10-CM | POA: Diagnosis not present

## 2019-07-16 DIAGNOSIS — N5201 Erectile dysfunction due to arterial insufficiency: Secondary | ICD-10-CM | POA: Diagnosis not present

## 2019-07-16 DIAGNOSIS — N410 Acute prostatitis: Secondary | ICD-10-CM | POA: Diagnosis not present

## 2019-08-07 DIAGNOSIS — R69 Illness, unspecified: Secondary | ICD-10-CM | POA: Diagnosis not present

## 2019-08-22 DIAGNOSIS — R69 Illness, unspecified: Secondary | ICD-10-CM | POA: Diagnosis not present

## 2019-08-29 DIAGNOSIS — L570 Actinic keratosis: Secondary | ICD-10-CM | POA: Diagnosis not present

## 2019-08-29 DIAGNOSIS — C44319 Basal cell carcinoma of skin of other parts of face: Secondary | ICD-10-CM | POA: Diagnosis not present

## 2019-08-29 DIAGNOSIS — X32XXXD Exposure to sunlight, subsequent encounter: Secondary | ICD-10-CM | POA: Diagnosis not present

## 2019-08-29 DIAGNOSIS — C4441 Basal cell carcinoma of skin of scalp and neck: Secondary | ICD-10-CM | POA: Diagnosis not present

## 2019-09-26 DIAGNOSIS — Z85828 Personal history of other malignant neoplasm of skin: Secondary | ICD-10-CM | POA: Diagnosis not present

## 2019-09-26 DIAGNOSIS — L820 Inflamed seborrheic keratosis: Secondary | ICD-10-CM | POA: Diagnosis not present

## 2019-09-26 DIAGNOSIS — Z08 Encounter for follow-up examination after completed treatment for malignant neoplasm: Secondary | ICD-10-CM | POA: Diagnosis not present

## 2019-09-26 DIAGNOSIS — C4441 Basal cell carcinoma of skin of scalp and neck: Secondary | ICD-10-CM | POA: Diagnosis not present

## 2019-09-26 DIAGNOSIS — L821 Other seborrheic keratosis: Secondary | ICD-10-CM | POA: Diagnosis not present

## 2019-09-26 DIAGNOSIS — X32XXXD Exposure to sunlight, subsequent encounter: Secondary | ICD-10-CM | POA: Diagnosis not present

## 2019-09-26 DIAGNOSIS — L570 Actinic keratosis: Secondary | ICD-10-CM | POA: Diagnosis not present

## 2019-10-10 DIAGNOSIS — R69 Illness, unspecified: Secondary | ICD-10-CM | POA: Diagnosis not present

## 2019-10-12 ENCOUNTER — Telehealth: Payer: Self-pay | Admitting: Family Medicine

## 2019-10-12 NOTE — Progress Notes (Signed)
  Chronic Care Management   Note  10/12/2019 Name: DAMARQUIS RUMLEY MRN: [id number] DOB: [birth year]  VARIAN WIND is a 74 y.o. year old male who is a primary care patient of Birdie Riddle, Aundra Millet, MD. I reached out to Brayton Caves by phone today in response to a referral sent by Mr. De Nurse PCP, Midge Minium, MD.   Mr. Goulette was given information about Chronic Care Management services today including:  1. CCM service includes personalized support from designated clinical staff supervised by his physician, including individualized plan of care and coordination with other care providers 2. 24/7 contact phone numbers for assistance for urgent and routine care needs. 3. Service will only be billed when office clinical staff spend 20 minutes or more in a month to coordinate care. 4. Only one practitioner may furnish and bill the service in a calendar month. 5. The patient may stop CCM services at any time (effective at the end of the month) by phone call to the office staff.   Patient agreed to services and verbal consent obtained.   This note is not being shared with the patient for the following reason: To respect privacy (The patient or proxy has requested that the information not be shared).  Follow up plan:   Earney Hamburg Upstream Scheduler

## 2019-11-13 ENCOUNTER — Other Ambulatory Visit: Payer: Self-pay | Admitting: Family Medicine

## 2019-11-21 DIAGNOSIS — Z6826 Body mass index (BMI) 26.0-26.9, adult: Secondary | ICD-10-CM | POA: Diagnosis not present

## 2019-11-21 DIAGNOSIS — Z008 Encounter for other general examination: Secondary | ICD-10-CM | POA: Diagnosis not present

## 2019-11-21 DIAGNOSIS — Z882 Allergy status to sulfonamides status: Secondary | ICD-10-CM | POA: Diagnosis not present

## 2019-11-21 DIAGNOSIS — Z85828 Personal history of other malignant neoplasm of skin: Secondary | ICD-10-CM | POA: Diagnosis not present

## 2019-11-21 DIAGNOSIS — Z833 Family history of diabetes mellitus: Secondary | ICD-10-CM | POA: Diagnosis not present

## 2019-11-21 DIAGNOSIS — Z7982 Long term (current) use of aspirin: Secondary | ICD-10-CM | POA: Diagnosis not present

## 2019-11-21 DIAGNOSIS — R030 Elevated blood-pressure reading, without diagnosis of hypertension: Secondary | ICD-10-CM | POA: Diagnosis not present

## 2019-11-21 DIAGNOSIS — Z8249 Family history of ischemic heart disease and other diseases of the circulatory system: Secondary | ICD-10-CM | POA: Diagnosis not present

## 2019-11-21 DIAGNOSIS — E663 Overweight: Secondary | ICD-10-CM | POA: Diagnosis not present

## 2019-11-21 DIAGNOSIS — E785 Hyperlipidemia, unspecified: Secondary | ICD-10-CM | POA: Diagnosis not present

## 2019-11-27 ENCOUNTER — Other Ambulatory Visit: Payer: Self-pay | Admitting: General Practice

## 2019-11-27 DIAGNOSIS — R030 Elevated blood-pressure reading, without diagnosis of hypertension: Secondary | ICD-10-CM

## 2019-11-27 DIAGNOSIS — E785 Hyperlipidemia, unspecified: Secondary | ICD-10-CM

## 2019-12-03 ENCOUNTER — Other Ambulatory Visit: Payer: Self-pay

## 2019-12-03 ENCOUNTER — Ambulatory Visit: Payer: Medicare HMO

## 2019-12-03 DIAGNOSIS — I2510 Atherosclerotic heart disease of native coronary artery without angina pectoris: Secondary | ICD-10-CM

## 2019-12-03 DIAGNOSIS — E785 Hyperlipidemia, unspecified: Secondary | ICD-10-CM

## 2019-12-03 DIAGNOSIS — J310 Chronic rhinitis: Secondary | ICD-10-CM

## 2019-12-03 NOTE — Patient Instructions (Addendum)
Please call me at [phone] (direct line) with any questions - thank you!  - Edyth Gunnels., Clinical Pharmacist  Goals Addressed            This Visit's Progress   . PharmD Care Plan       CARE PLAN ENTRY  Current Barriers:  . Chronic Disease Management support, education, and care coordination needs related to Hyperlipidemia, Coronary Artery Disease, and chronic rhinitis   Hyperlipidemia / Coronary Atherosclerosis . Pharmacist Clinical Goal(s): o Over the next 180 days, patient will work with PharmD and providers to achieve LDL goal < 100 . Current regimen:  o Rosuvastatin 10 mg daily . Interventions: o Continue current management . Patient self care activities - Over the next 180 days, patient will: o Continue current management  Chronic Rhinitis . Pharmacist Clinical Goal(s) o Over the next 180 days, patient will work with PharmD and providers to minimize any symptoms of chronic rhinitis.  . Current regimen:  . Fexofenadine 180 mg daily  . Fluticasone 50 mcg/act - 2 sprays into both nostrils daily as needed for allergies  . Interventions: o Continue current management . Patient self care activities - Over the next 180 days, patient will: o Continue current management  Medication management . Pharmacist Clinical Goal(s): o Over the next 180 days, patient will work with PharmD and providers to maintain optimal medication adherence . [facility] . Interventions o Comprehensive medication review performed. o Continue current medication management strategy . Patient self care activities - Over the next 180 days, patient will: o Focus on medication adherence by continuing current management o Take medications as prescribed o Report any questions or concerns to PharmD and/or provider(s)       Jerry Simpson was given information about Chronic Care Management services today including:  1. CCM service includes personalized support from designated clinical  staff supervised by his physician, including individualized plan of care and coordination with other care providers 2. 24/7 contact phone numbers for assistance for urgent and routine care needs. 3. Standard insurance, coinsurance, copays and deductibles apply for chronic care management only during months in which we provide at least 20 minutes of these services. Most insurances cover these services at 100%, however patients may be responsible for any copay, coinsurance and/or deductible if applicable. This service may help you avoid the need for more expensive face-to-face services. 4. Only one practitioner may furnish and bill the service in a calendar month. 5. The patient may stop CCM services at any time (effective at the end of the month) by phone call to the office staff.  Patient agreed to services and verbal consent obtained.   The patient verbalized understanding of instructions provided today and agreed to receive a mailed copy of patient instruction and/or educational materials. Telephone follow up appointment with pharmacy team member scheduled for: See next appointment with "Care Management Staff" under "What's Next" below.   Thank you!  [name], Pharm.D., BCGP Clinical Pharmacist [facility] [phone]  High Cholesterol  High cholesterol is a condition in which the blood has high levels of a white, waxy, fat-like substance (cholesterol). The human body needs small amounts of cholesterol. The liver makes all the cholesterol that the body needs. Extra (excess) cholesterol comes from the food that we eat. Cholesterol is carried from the liver by the blood through the blood vessels. If you have high cholesterol, deposits (plaques) may build up on the walls of your blood vessels (  arteries). Plaques make the arteries narrower and stiffer. Cholesterol plaques increase your risk for heart attack and stroke. Work with your health care provider to  keep your cholesterol levels in a healthy range. What increases the risk? This condition is more likely to develop in people who:  Eat foods that are high in animal fat (saturated fat) or cholesterol.  Are overweight.  Are not getting enough exercise.  Have a family history of high cholesterol. What are the signs or symptoms? There are no symptoms of this condition. How is this diagnosed? This condition may be diagnosed from the results of a blood test.  If you are older than age 72, your health care provider may check your cholesterol every 4-6 years.  You may be checked more often if you already have high cholesterol or other risk factors for heart disease. The blood test for cholesterol measures:  "Bad" cholesterol (LDL cholesterol). This is the main type of cholesterol that causes heart disease. The desired level for LDL is less than 100.  "Good" cholesterol (HDL cholesterol). This type helps to protect against heart disease by cleaning the arteries and carrying the LDL away. The desired level for HDL is 60 or higher.  Triglycerides. These are fats that the body can store or burn for energy. The desired number for triglycerides is lower than 150.  Total cholesterol. This is a measure of the total amount of cholesterol in your blood, including LDL cholesterol, HDL cholesterol, and triglycerides. A healthy number is less than 200. How is this treated? This condition is treated with diet changes, lifestyle changes, and medicines. Diet changes  This may include eating more whole grains, fruits, vegetables, nuts, and fish.  This may also include cutting back on red meat and foods that have a lot of added sugar. Lifestyle changes  Changes may include getting at least 40 minutes of aerobic exercise 3 times a week. Aerobic exercises include walking, biking, and swimming. Aerobic exercise along with a healthy diet can help you maintain a healthy weight.  Changes may also include  quitting smoking. Medicines  Medicines are usually given if diet and lifestyle changes have failed to reduce your cholesterol to healthy levels.  Your health care provider may prescribe a statin medicine. Statin medicines have been shown to reduce cholesterol, which can reduce the risk of heart disease. Follow these instructions at home: Eating and drinking If told by your health care provider:  Eat chicken (without skin), fish, veal, shellfish, ground [place] breast, and round or loin cuts of red meat.  Do not eat fried foods or fatty meats, such as hot dogs and salami.  Eat plenty of fruits, such as apples.  Eat plenty of vegetables, such as broccoli, potatoes, and carrots.  Eat beans, peas, and lentils.  Eat grains such as barley, rice, couscous, and bulgur wheat.  Eat pasta without cream sauces.  Use skim or nonfat milk, and eat low-fat or nonfat yogurt and cheeses.  Do not eat or drink whole milk, cream, ice cream, egg yolks, or hard cheeses.  Do not eat stick margarine or tub margarines that contain trans fats (also called partially hydrogenated oils).  Do not eat saturated tropical oils, such as coconut oil and palm oil.  Do not eat cakes, cookies, crackers, or other baked goods that contain trans fats.  General instructions  Exercise as directed by your health care provider. Increase your activity level with activities such as gardening, walking, and taking the stairs.  Take over-the-counter  and prescription medicines only as told by your health care provider.  Do not use any products that contain nicotine or tobacco, such as cigarettes and e-cigarettes. If you need help quitting, ask your health care provider.  Keep all follow-up visits as told by your health care provider. This is important. Contact a health care provider if:  You are struggling to maintain a healthy diet or weight.  You need help to start on an exercise program.  You need help to stop  smoking. Get help right away if:  You have chest pain.  You have trouble breathing. This information is not intended to replace advice given to you by your health care provider. Make sure you discuss any questions you have with your health care provider. Document Revised: 06/10/2017 Document Reviewed: 12/06/2015 Elsevier Patient Education  [place].

## 2019-12-03 NOTE — Progress Notes (Signed)
Chronic Care Management Pharmacy  Name: Jerry Simpson  MRN: [id number] DOB: Oct 17, 1945  Chief Complaint/ HPI  Jerry Simpson,  74 y.o. , male presents for their Initial CCM visit with the clinical pharmacist via telephone due to COVID-19 Pandemic. Patient is accompanied by wife today. Expresses concern for wife's well being but denies any issues or complaints with his own health at this time.   Patient's LDL/TG elevated at last visit and was restarted on Crestor 10 mg daily.  Extremely satisfied with previous prostatic artery embolization and denies any urinary issues since.   PCP : Midge Minium, MD  Chronic conditions include:  Encounter Diagnoses  Name Primary?  . Hyperlipidemia, unspecified hyperlipidemia type Yes  . Atherosclerosis of native coronary artery of native heart without angina pectoris   . Chronic rhinitis     Patient Active Problem List   Diagnosis Date Noted  . Upper airway cough syndrome 06/12/2015  . Physical exam 07/09/2014  . Sleep apnea 07/09/2014  . Bladder tumor   . Reactive airway disease that is not asthma 05/13/2014  . Blood donor 07/04/2013  . HYPERGLYCEMIA, FASTING 07/16/2010  . Coronary atherosclerosis 10/16/2008  . BENIGN PROSTATIC HYPERTROPHY 10/16/2008  . SKIN CANCER, HX OF 10/16/2008  . COLONIC POLYPS, HX OF 10/16/2008  . ELEVATED PROSTATE SPECIFIC ANTIGEN 06/05/2008  . ELEVATED BLOOD PRESSURE WITHOUT DIAGNOSIS OF HYPERTENSION 06/05/2008  . Hyperlipidemia 09/27/2007  . GERD 06/20/2007  . Allergic rhinitis, cause unspecified 05/05/2007   Past Surgical History:  Procedure Laterality Date  . ANGIOPLASTY  2004   stent placement ;sees Dr Ron Parker  . CATARACT EXTRACTION, BILATERAL  2012  . COLONOSCOPY    . colonoscopy with polypectomy     X2, Dr Henrene Pastor  . POLYPECTOMY    . prostatic artery embolization    . TONSILLECTOMY  1962   Social History   Socioeconomic History  . Marital status: Married    Spouse name: Not on file  .  Number of children: Not on file  . Years of education: Not on file  . Highest education level: Not on file  Occupational History  . Not on file  Tobacco Use  . Smoking status: Never Smoker  . Smokeless tobacco: Never Used  Vaping Use  . Vaping Use: Never used  Substance and Sexual Activity  . Alcohol use: Yes    Alcohol/week: 7.0 standard drinks    Types: 7 drink(s) per week    Comment:  < 7/ week  . Drug use: No  . Sexual activity: Not on file  Other Topics Concern  . Not on file  Social History Narrative  . Not on file   Social Determinants of Health   Financial Resource Strain:   . Difficulty of Paying Living Expenses:   Food Insecurity: No Food Insecurity  . Worried About Charity fundraiser in the Last Year: Never true  . Ran Out of Food in the Last Year: Never true  Transportation Needs: No Transportation Needs  . Lack of Transportation (Medical): No  . Lack of Transportation (Non-Medical): No  Physical Activity:   . Days of Exercise per Week:   . Minutes of Exercise per Session:   Stress:   . Feeling of Stress :   Social Connections:   . Frequency of Communication with Friends and Family:   . Frequency of Social Gatherings with Friends and Family:   . Attends Religious Services:   . Active Member of Clubs or Organizations:   .  Attends Archivist Meetings:   Marland Kitchen Marital Status:    Family History  Problem Relation Age of Onset  . Hypertension Father   . Heart attack Father 51  . Diabetes Father   . Cancer Brother        lung cancer ; leukemia terminally  . Heart attack Brother 75  . Heart disease Paternal Uncle 76  . Stroke Maternal Grandmother        >65  . Colon cancer Neg Hx   . Stomach cancer Neg Hx   . Colon polyps Neg Hx   . Esophageal cancer Neg Hx   . Rectal cancer Neg Hx    Allergies  Allergen Reactions  . Sulfonamide Derivatives Rash    RASH  Because of a history of documented adverse serious drug reaction;Medi Alert bracelet   is recommended  . Claritin [Loratadine]     Pt states it affects his prostate.    Outpatient Encounter Medications as of 12/03/2019  Medication Sig Note  . ASPIRIN 81 PO Take by mouth.   . fexofenadine (ALLEGRA) 180 MG tablet Take 180 mg by mouth daily as needed for allergies.    . fluticasone (FLONASE) 50 MCG/ACT nasal spray Place 2 sprays into both nostrils daily. (Patient taking differently: Place 2 sprays into both nostrils daily as needed for allergies. )   . rosuvastatin (CRESTOR) 10 MG tablet TAKE ONE TABLET BY MOUTH DAILY   . cetirizine (ZYRTEC) 10 MG tablet Take 10 mg by mouth daily. (Patient not taking: Reported on 12/03/2019)   . Ibuprofen (ADVIL) 200 MG CAPS Take 1 capsule by mouth at bedtime. (Patient not taking: Reported on 12/03/2019)   . pantoprazole (PROTONIX) 40 MG tablet Take 1 tablet by mouth once daily (Patient not taking: Reported on 12/03/2019) 04/11/2019: PRN  . Red Yeast Rice Extract (RED YEAST RICE PO) Take 2 tablets by mouth daily.  (Patient not taking: Reported on 12/03/2019)    No facility-administered encounter medications on file as of 12/03/2019.   Patient Care Team    Relationship Specialty Notifications Start End  Midge Minium, MD PCP - General Family Medicine  04/18/14    Comment: Nestor Ramp, Docia Chuck, MD Consulting Physician Gastroenterology  07/09/14   Beryle Lathe, MD Referring Physician General Surgery  08/18/15    Comment: Urology  [facility], Michae Kava, MD Attending Physician   12/29/17    Comment: Vernona Rieger, Jenny Reichmann, MD  Dermatology  12/29/17   [name], [facility] Pharmacist Pharmacist  10/12/19    Comment: PHONE NUMBER [phone]   Current Diagnosis/Assessment: Goals Addressed            This Visit's Progress   . PharmD Care Plan       CARE PLAN ENTRY  Current Barriers:  . Chronic Disease Management support, education, and care coordination needs related to Hyperlipidemia, Coronary Artery Disease, and chronic rhinitis    Hyperlipidemia / Coronary Atherosclerosis . Pharmacist Clinical Goal(s): o Over the next 180 days, patient will work with PharmD and providers to achieve LDL goal < 100 . Current regimen:  o Rosuvastatin 10 mg daily . Interventions: o Continue current management . Patient self care activities - Over the next 180 days, patient will: o Continue current management  Chronic Rhinitis . Pharmacist Clinical Goal(s) o Over the next 180 days, patient will work with PharmD and providers to minimize any symptoms of chronic rhinitis.  . Current regimen:  . Fexofenadine 180 mg daily  . Fluticasone  50 mcg/act - 2 sprays into both nostrils daily as needed for allergies  . Interventions: o Continue current management . Patient self care activities - Over the next 180 days, patient will: o Continue current management  Medication management . Pharmacist Clinical Goal(s): o Over the next 180 days, patient will work with PharmD and providers to maintain optimal medication adherence . [facility] . Interventions o Comprehensive medication review performed. o Continue current medication management strategy . Patient self care activities - Over the next 180 days, patient will: o Focus on medication adherence by continuing current management o Take medications as prescribed o Report any questions or concerns to PharmD and/or provider(s)       Hyperlipidemia / Coronary atherosclerosis    Hepatic Function Latest Ref Rng & Units 05/22/2019 05/23/2018 03/14/2018  Total Protein 6.0 - 8.3 g/dL 7.3 6.9 7.0  Albumin 3.5 - 5.2 g/dL 4.5 4.3 4.3  AST 0 - 37 U/L '20 16 16  ' ALT 0 - 53 U/L '22 17 15  ' Alk Phosphatase 39 - 117 U/L 59 53 45  Total Bilirubin 0.2 - 1.2 mg/dL 0.7 0.6 0.6  Bilirubin, Direct 0.0 - 0.3 mg/dL 0.1 0.1 0.1      Component Value Date/Time   CHOL 195 05/22/2019 0813   TRIG 238.0 (H) 05/22/2019 0813   TRIG 92 06/09/2006 0818   HDL 30.00 (L) 05/22/2019 0813   LDLCALC  113 (H) 03/14/2018 0914   LDLDIRECT 126.0 05/22/2019 0813    The 10-year ASCVD risk score Mikey Bussing DC Jr., et al., 2013) is: 24.2%   Values used to calculate the score:     Age: 69 years     Sex: Male     Is Non-Hispanic African American: No     Diabetic: No     Tobacco smoker: No     Systolic Blood Pressure: 811 mmHg     Is BP treated: No     HDL Cholesterol: 30 mg/dL     Total Cholesterol: 195 mg/dL   LDL/TG elevated at last visit - restarted rosuvastatin 10 mg daily. No longer taking red yeast rice extract. Denies any muscle or abdominal pain or n/v. Patient is currently controlled on the following medications: . Rouvastatin 10 mg daily   Coronary Atherosclerosis. On ASA for cvd prevention. Denies any abnormal bruising, bleeding from nose or gums or blood in urine or stool. Patient is currently controlled on the following medications:  . Aspirin 81 mg daily   Plan  Continue current medications.    BPH     Lab Results  Component Value Date   PSA 2.02 05/22/2019   PSA 0.99 03/14/2018   PSA 5.83 (H) 03/10/2017   PSA at goal, no medication management. Denies urinary symptoms urinary frequency, incomplete voiding, weak stream. Has been controlled since prostatic artery embolization.  Plan  Continue current medications.  Chronic rhinitis   No complaint of ongoing rhinitis. Patient is currently controlled on the following medications:  . Fexofenadine 180 mg daily  . Fluticasone 50 mcg/act - 2 sprays into both nostrils daily as needed for allergies   Plan  Continue current medications.  Vaccines   Reviewed and discussed patient's vaccination history.    Immunization History  Administered Date(s) Administered  . Influenza Split 03/30/2011, 04/10/2012  . Influenza Whole 04/25/2007, 04/03/2009, 06/05/2010  . Influenza, High Dose Seasonal PF 05/08/2013, 04/02/2015, 04/08/2016, 03/09/2019  . Influenza,inj,Quad PF,6+ Mos 04/18/2014, 03/14/2017  . Pneumococcal Conjugate-13  07/04/2013  . Pneumococcal Polysaccharide-23  07/09/2014  . Tdap 06/30/2012  . Zoster 03/30/2011   Plan  Recommended patient receive Shingrix vaccine in pharmacy.   Medication Management   Rece[facility] prescription medications from:  [facility] [address] [place] [address] Phone: [phone] Fax: [phone]  Denies any issues with current medication management.   Plan  Continue current medication management strategy.  Follow up: 6 month phone visit. ______________ Visit Information SDOH (Social Determinants of Health) assessments performed: Yes.  Mr. Timberman was given information about Chronic Care Management services today including:  1. CCM service includes personalized support from designated clinical staff supervised by his physician, including individualized plan of care and coordination with other care providers 2. 24/7 contact phone numbers for assistance for urgent and routine care needs. 3. Standard insurance, coinsurance, copays and deductibles apply for chronic care management only during months in which we provide at least 20 minutes of these services. Most insurances cover these services at 100%, however patients may be responsible for any copay, coinsurance and/or deductible if applicable. This service may help you avoid the need for more expensive face-to-face services. 4. Only one practitioner may furnish and bill the service in a calendar month. 5. The patient may stop CCM services at any time (effective at the end of the month) by phone call to the office staff.  Patient agreed to services and verbal consent obtained.   [name], Pharm.D., BCGP Clinical Pharmacist [facility] [phone]

## 2019-12-12 DIAGNOSIS — C44619 Basal cell carcinoma of skin of left upper limb, including shoulder: Secondary | ICD-10-CM | POA: Diagnosis not present

## 2019-12-12 DIAGNOSIS — Z85828 Personal history of other malignant neoplasm of skin: Secondary | ICD-10-CM | POA: Diagnosis not present

## 2019-12-12 DIAGNOSIS — B078 Other viral warts: Secondary | ICD-10-CM | POA: Diagnosis not present

## 2019-12-12 DIAGNOSIS — Z08 Encounter for follow-up examination after completed treatment for malignant neoplasm: Secondary | ICD-10-CM | POA: Diagnosis not present

## 2019-12-12 DIAGNOSIS — L820 Inflamed seborrheic keratosis: Secondary | ICD-10-CM | POA: Diagnosis not present

## 2020-01-15 ENCOUNTER — Telehealth: Payer: Self-pay | Admitting: Family Medicine

## 2020-01-15 DIAGNOSIS — E785 Hyperlipidemia, unspecified: Secondary | ICD-10-CM

## 2020-01-15 NOTE — Telephone Encounter (Signed)
Patient has a 6 month follow up scheduled for Thursday - he would like like for you to put in a lab order so he can get labs done tomorrow at [facility].

## 2020-01-15 NOTE — Telephone Encounter (Signed)
Ok for CBC, BMP, LFTs, lipid panel, TSH dx hyperlipidemia

## 2020-01-15 NOTE — Telephone Encounter (Signed)
Labs were ordered

## 2020-01-15 NOTE — Telephone Encounter (Signed)
Please advise on labs.

## 2020-01-16 ENCOUNTER — Other Ambulatory Visit (INDEPENDENT_AMBULATORY_CARE_PROVIDER_SITE_OTHER): Payer: Medicare HMO

## 2020-01-16 DIAGNOSIS — E785 Hyperlipidemia, unspecified: Secondary | ICD-10-CM

## 2020-01-16 LAB — BASIC METABOLIC PANEL
BUN: 17 mg/dL (ref 6–23)
CO2: 27 mEq/L (ref 19–32)
Calcium: 9.1 mg/dL (ref 8.4–10.5)
Chloride: 103 mEq/L (ref 96–112)
Creatinine, Ser: 1.07 mg/dL (ref 0.40–1.50)
GFR: 67.91 mL/min (ref >60.00)
Glucose, Bld: 126 mg/dL — ABNORMAL HIGH (ref 70–99)
Potassium: 3.8 mEq/L (ref 3.5–5.1)
Sodium: 136 mEq/L (ref 135–145)

## 2020-01-16 LAB — HEPATIC FUNCTION PANEL
ALT: 20 U/L (ref 0–53)
AST: 19 U/L (ref 0–37)
Albumin: 4.3 g/dL (ref 3.5–5.2)
Alkaline Phosphatase: 49 U/L (ref 39–117)
Bilirubin, Direct: 0.1 mg/dL (ref 0.0–0.3)
Total Bilirubin: 0.6 mg/dL (ref 0.2–1.2)
Total Protein: 7.4 g/dL (ref 6.0–8.3)

## 2020-01-16 LAB — CBC WITH DIFFERENTIAL/PLATELET
Basophils Absolute: 0.1 10*3/uL (ref 0.0–0.1)
Basophils Relative: 1.1 % (ref 0.0–3.0)
Eosinophils Absolute: 0.2 10*3/uL (ref 0.0–0.7)
Eosinophils Relative: 2.6 % (ref 0.0–5.0)
HCT: 42.1 % (ref 39.0–52.0)
Hemoglobin: 14.2 g/dL (ref 13.0–17.0)
Lymphocytes Relative: 29.3 % (ref 12.0–46.0)
Lymphs Abs: 2.2 10*3/uL (ref 0.7–4.0)
MCHC: 33.7 g/dL (ref 30.0–36.0)
MCV: 84.5 fl (ref 78.0–100.0)
Monocytes Absolute: 0.7 10*3/uL (ref 0.1–1.0)
Monocytes Relative: 9 % (ref 3.0–12.0)
Neutro Abs: 4.4 10*3/uL (ref 1.4–7.7)
Neutrophils Relative %: 58 % (ref 43.0–77.0)
Platelets: 264 10*3/uL (ref 150.0–400.0)
RBC: 4.98 Mil/uL (ref 4.22–5.81)
RDW: 14.7 % (ref 11.5–15.5)
WBC: 7.6 10*3/uL (ref 4.0–10.5)

## 2020-01-16 LAB — LIPID PANEL
Cholesterol: 134 mg/dL (ref 0–200)
HDL: 27.5 mg/dL — ABNORMAL LOW (ref >39.00)
NonHDL: 106.63
Total CHOL/HDL Ratio: 5
Triglycerides: 207 mg/dL — ABNORMAL HIGH (ref 0.0–149.0)
VLDL: 41.4 mg/dL — ABNORMAL HIGH (ref 0.0–40.0)

## 2020-01-16 LAB — TSH: TSH: 2.09 u[IU]/mL (ref 0.35–4.50)

## 2020-01-16 LAB — LDL CHOLESTEROL, DIRECT: Direct LDL: 86 mg/dL

## 2020-01-17 ENCOUNTER — Ambulatory Visit (INDEPENDENT_AMBULATORY_CARE_PROVIDER_SITE_OTHER): Payer: Medicare HMO | Admitting: Family Medicine

## 2020-01-17 ENCOUNTER — Other Ambulatory Visit (INDEPENDENT_AMBULATORY_CARE_PROVIDER_SITE_OTHER): Payer: Medicare HMO

## 2020-01-17 ENCOUNTER — Encounter: Payer: Self-pay | Admitting: Family Medicine

## 2020-01-17 ENCOUNTER — Other Ambulatory Visit: Payer: Self-pay

## 2020-01-17 VITALS — BP 125/79 | HR 88 | Temp 97.0°F | Resp 16 | Ht 74.0 in | Wt 214.0 lb

## 2020-01-17 DIAGNOSIS — E663 Overweight: Secondary | ICD-10-CM

## 2020-01-17 DIAGNOSIS — M5136 Other intervertebral disc degeneration, lumbar region: Secondary | ICD-10-CM

## 2020-01-17 DIAGNOSIS — M25562 Pain in left knee: Secondary | ICD-10-CM | POA: Diagnosis not present

## 2020-01-17 DIAGNOSIS — M51369 Other intervertebral disc degeneration, lumbar region without mention of lumbar back pain or lower extremity pain: Secondary | ICD-10-CM

## 2020-01-17 DIAGNOSIS — R7309 Other abnormal glucose: Secondary | ICD-10-CM

## 2020-01-17 DIAGNOSIS — E785 Hyperlipidemia, unspecified: Secondary | ICD-10-CM

## 2020-01-17 LAB — HEMOGLOBIN A1C: Hgb A1c MFr Bld: 5.8 % (ref 4.6–6.5)

## 2020-01-17 NOTE — Assessment & Plan Note (Signed)
Pt has gained 5 lbs since last visit.  Stressed need for healthy diet and regular exercise.  Will continue to follow.

## 2020-01-17 NOTE — Patient Instructions (Signed)
Schedule your complete physical in 6 months Labs look good!  No changes at this time Continue to work on healthy diet and regular exercise- you can do it! Call with any questions or concerns Stay Safe!  Stay Healthy!

## 2020-01-17 NOTE — Assessment & Plan Note (Signed)
Chronic problem.  Tolerating Crestor w/o difficulty.  Check labs.  Adjust meds prn  

## 2020-01-17 NOTE — Progress Notes (Signed)
   Subjective:    Patient ID: Jerry Simpson, male    DOB: May 29, 1946, 74 y.o.   MRN: [id number]  HPI Hyperlipidemia- chronic problem, on Crestory 10mg  daily.  Total cholesterol 134, LDL 86, HDL 27.5.  No CP.  + SOB w/ exertion- upcoming appt w/ Cards ([facility]).  No abd pain, N/V.  Fasting hyperglycemia- pt's glucose was 126 but A1C 5.8  Not following a particular diet.  Overweight- pt has gained 5 lbs since last visit.  BMI now 27.48  Rare exercise- but very active otherwise.  Goal is to resume 30 minute walks when schedule changes in September.  Herniated L4/L5- dx'd years ago.  Has intermittent issues w/ radiating pain.    L knee pain- occurred when going down steps earlier this week.  No swelling, popping.  Worsens w/ prolonged activity.   Review of Systems For ROS see HPI   This visit occurred during the SARS-CoV-2 public health emergency.  Safety protocols were in place, including screening questions prior to the visit, additional usage of staff PPE, and extensive cleaning of exam room while observing appropriate contact time as indicated for disinfecting solutions.       Objective:   Physical Exam Vitals reviewed.  Constitutional:      General: He is not in acute distress.    Appearance: Normal appearance. He is well-developed.  HENT:     Head: Normocephalic and atraumatic.  Eyes:     Conjunctiva/sclera: Conjunctivae normal.     Pupils: Pupils are equal, round, and reactive to light.  Neck:     Thyroid: No thyromegaly.  Cardiovascular:     Rate and Rhythm: Normal rate and regular rhythm.     Heart sounds: Normal heart sounds. No murmur heard.   Pulmonary:     Effort: Pulmonary effort is normal. No respiratory distress.     Breath sounds: Normal breath sounds.  Abdominal:     General: Bowel sounds are normal. There is no distension.     Palpations: Abdomen is soft.  Musculoskeletal:     Cervical back: Normal range of motion and neck supple.  Lymphadenopathy:      Cervical: No cervical adenopathy.  Skin:    General: Skin is warm and dry.  Neurological:     Mental Status: He is alert and oriented to person, place, and time.     Cranial Nerves: No cranial nerve deficit.  Psychiatric:        Behavior: Behavior normal.           Assessment & Plan:  L knee pain- new.  Occurred when walking down the stairs earlier this week.  No swelling, no cracking/popping.  Pt is interested in PT evaluation/exercises.  Referral placed.  DDD- pt dx'd 'years ago' by neurosurg.  Reports he has overall been very lucky but will have times when he has radiating pain.  Again, interested in PT.

## 2020-01-17 NOTE — Assessment & Plan Note (Signed)
Fasting sugar 126 but A1C WNL at 5.8  Encouraged healthy diet and regular exercise.  No further work up or intervention at this time

## 2020-01-23 NOTE — Progress Notes (Signed)
Cardiology Office Note   Date:  01/24/2020   ID:  Jerry Simpson, DOB 1945/12/22, MRN [id number]  PCP:  Midge Minium, MD  Cardiologist:   No primary care provider on file. Referring:  Midge Minium, MD  Chief Complaint  Patient presents with   Shortness of Breath      History of Present Illness: Jerry Simpson is a 74 y.o. male who presents for follow up of CAD.  He saw Dr. Ron Parker years ago.  He has had stenting with a Taxus stent in 2004 in the circumflex.  He had a negative stress echo in 2011.    He has not been seen in years.  He actually does relatively well.  He has noticed some shortness of breath with activity such as climbing the stairs or walking behind a self-propelled mower.  He recovers quickly.  He is not having any resting shortness of breath, PND or orthopnea.  Has had no new palpitations, presyncope or syncope.  Back in 2004 his symptom was some left shoulder discomfort and he does not get this.  He does not have any PND or orthopnea.  He said no weight gain or edema.   Past Medical History:  Diagnosis Date   Adenomatous polyps    Allergy    Borderline systolic HTN    BPH (benign prostatic hyperplasia)    elevated PSA, Dr Jeffie Pollock   CAD (coronary artery disease)    Cataract    removed both eyes    Environmental allergies    GERD (gastroesophageal reflux disease)    Hyperlipidemia     Past Surgical History:  Procedure Laterality Date   ANGIOPLASTY  2004   stent placement ;sees Dr Ron Parker   CATARACT EXTRACTION, BILATERAL  2012   COLONOSCOPY     colonoscopy with polypectomy     X2, Dr Henrene Pastor   POLYPECTOMY     prostatic artery embolization     TONSILLECTOMY  1962     Current Outpatient Medications  Medication Sig Dispense Refill   ASPIRIN 81 PO Take by mouth.     fexofenadine (ALLEGRA) 180 MG tablet Take 180 mg by mouth daily as needed for allergies.      finasteride (PROPECIA) 1 MG tablet Take 0.5 mg by mouth daily.  TAKE 1/2 TABLET TOTAL 0.5MG      finasteride (PROPECIA) 1 MG tablet      fluticasone (FLONASE) 50 MCG/ACT nasal spray Place 2 sprays into both nostrils daily. 16 g 1   rosuvastatin (CRESTOR) 10 MG tablet TAKE ONE TABLET BY MOUTH DAILY 90 tablet 0   No current facility-administered medications for this visit.    Allergies:   Sulfonamide derivatives and Claritin [loratadine]    Social History:  The patient  reports that he has never smoked. He has never used smokeless tobacco. He reports current alcohol use of about 7.0 standard drinks of alcohol per week. He reports that he does not use drugs.   Family History:  The patient's family history includes Cancer in his brother; Diabetes in his father; Heart attack (age of onset: 87) in his brother; Heart attack (age of onset: 63) in his father; Heart disease (age of onset: 67) in his paternal uncle; Hypertension in his father; Stroke in his maternal grandmother.    ROS:  Please see the history of present illness.   Otherwise, review of systems are positive for snoring.   All other systems are reviewed and negative.    PHYSICAL EXAM:  VS:  BP 130/83    Pulse 68    Temp (!) 97.2 F (36.2 C)    Ht 6\' 2"  (1.88 m)    Wt 214 lb (97.1 kg)    SpO2 99%    BMI 27.48 kg/m  , BMI Body mass index is 27.48 kg/m. GENERAL:  Well appearing HEENT:  Pupils equal round and reactive, fundi not visualized, oral mucosa unremarkable NECK:  No jugular venous distention, waveform within normal limits, carotid upstroke brisk and symmetric, no bruits, no thyromegaly LYMPHATICS:  No cervical, inguinal adenopathy LUNGS:  Clear to auscultation bilaterally BACK:  No CVA tenderness CHEST:  Unremarkable HEART:  PMI not displaced or sustained,S1 and S2 within normal limits, no S3, no S4, no clicks, no rubs, 2 out of 6 brief apical systolic murmur nonradiating, no diastolic murmurs ABD:  Flat, positive bowel sounds normal in frequency in pitch, no bruits, no rebound, no  guarding, no midline pulsatile mass, no hepatomegaly, no splenomegaly EXT:  2 plus pulses throughout, no edema, no cyanosis no clubbing SKIN:  No rashes no nodules NEURO:  Cranial nerves II through XII grossly intact, motor grossly intact throughout PSYCH:  Cognitively intact, oriented to person place and time    EKG:  EKG is ordered today. The ekg ordered today demonstrates sinus rhythm, rate 68, axis within normal limits, intervals within normal limits, no acute ST-T wave changes.   Recent Labs: 01/16/2020: ALT 20; BUN 17; Creatinine, Ser 1.07; Hemoglobin 14.2; Platelets 264.0; Potassium 3.8; Sodium 136; TSH 2.09    Lipid Panel    Component Value Date/Time   CHOL 134 01/16/2020 0837   TRIG 207.0 (H) 01/16/2020 0837   TRIG 92 06/09/2006 0818   HDL 27.50 (L) 01/16/2020 0837   CHOLHDL 5 01/16/2020 0837   VLDL 41.4 (H) 01/16/2020 0837   LDLCALC 113 (H) 03/14/2018 0914   LDLDIRECT 86.0 01/16/2020 0837      Wt Readings from Last 3 Encounters:  01/24/20 214 lb (97.1 kg)  01/17/20 (!) 214 lb (97.1 kg)  05/24/19 213 lb 4 oz (96.7 kg)      Other studies Reviewed: Additional studies/ records that were reviewed today include: Previous cardiology records and labs. Review of the above records demonstrates:  Please see elsewhere in the note.     ASSESSMENT AND PLAN:  CAD:   He has distant coronary artery disease and some ongoing shortness of breath.  I would like to screen him with a POET (Plain Old Exercise Treadmill).  He will continue to have risk reduction.  DYSLPIDEMIA: His LDL is not quite at target at 86.  I have suggested exercise and diet and follow-up on the current meds with a goal LDL of 70.  MURMUR: He might have some mitral regurgitation as this does not radiate at the aortic outflow tract.  I am going to screen him with an echocardiogram.    Current medicines are reviewed at length with the patient today.  The patient does not have concerns regarding  medicines.  The following changes have been made:  no change  Labs/ tests ordered today include:  Orders Placed This Encounter  Procedures   Exercise Tolerance Test   EKG 12-Lead   ECHOCARDIOGRAM COMPLETE     Disposition:   FU with me in one year.     Signed, Minus Breeding, MD  01/24/2020 11:40 AM    [place]

## 2020-01-24 ENCOUNTER — Ambulatory Visit: Payer: Medicare HMO | Admitting: Cardiology

## 2020-01-24 ENCOUNTER — Encounter: Payer: Self-pay | Admitting: Cardiology

## 2020-01-24 ENCOUNTER — Other Ambulatory Visit: Payer: Self-pay

## 2020-01-24 VITALS — BP 130/83 | HR 68 | Temp 97.2°F | Ht 74.0 in | Wt 214.0 lb

## 2020-01-24 DIAGNOSIS — E785 Hyperlipidemia, unspecified: Secondary | ICD-10-CM

## 2020-01-24 DIAGNOSIS — I2510 Atherosclerotic heart disease of native coronary artery without angina pectoris: Secondary | ICD-10-CM | POA: Diagnosis not present

## 2020-01-24 DIAGNOSIS — R011 Cardiac murmur, unspecified: Secondary | ICD-10-CM

## 2020-01-24 DIAGNOSIS — R0602 Shortness of breath: Secondary | ICD-10-CM | POA: Diagnosis not present

## 2020-01-24 NOTE — Patient Instructions (Signed)
Medication Instructions:  Decrease Aspirin to 81 mg daily *If you need a refill on your cardiac medications before your next appointment, please call your pharmacy*   Lab Work: None ordered   Testing/Procedures: Schedule Treadmill ( POET ) Schedule Echo   Follow-Up: At [facility], you and your health needs are our priority.  As part of our continuing mission to provide you with exceptional heart care, we have created designated Provider Care Teams.  These Care Teams include your primary Cardiologist (physician) and Advanced Practice Providers (APPs -  Physician Assistants and Nurse Practitioners) who all work together to provide you with the care you need, when you need it.  We recommend signing up for the patient portal called "MyChart".  Sign up information is provided on this After Visit Summary.  MyChart is used to connect with patients for Virtual Visits (Telemedicine).  Patients are able to view lab/test results, encounter notes, upcoming appointments, etc.  Non-urgent messages can be sent to your provider as well.   To learn more about what you can do with MyChart, go to [email or web address].    Your next appointment:  1 year    Call in May to schedule August appointment    The format for your next appointment: Office    Provider:  Dr.Hochrein

## 2020-01-30 ENCOUNTER — Other Ambulatory Visit: Payer: Self-pay | Admitting: Family Medicine

## 2020-02-04 ENCOUNTER — Telehealth (HOSPITAL_COMMUNITY): Payer: Self-pay | Admitting: Cardiology

## 2020-02-04 NOTE — Telephone Encounter (Signed)
Patient called and cancelled GXT and echocardiogram with scheduler. Patient stated that is is not necessary at this time. Order will be removed from the WQ.

## 2020-02-08 ENCOUNTER — Other Ambulatory Visit (HOSPITAL_COMMUNITY): Payer: Medicare HMO

## 2020-02-12 ENCOUNTER — Other Ambulatory Visit (HOSPITAL_COMMUNITY): Payer: Medicare HMO

## 2020-02-15 ENCOUNTER — Ambulatory Visit (HOSPITAL_COMMUNITY)
Admission: RE | Admit: 2020-02-15 | Payer: Medicare HMO | Source: Ambulatory Visit | Attending: Cardiology | Admitting: Cardiology

## 2020-03-12 ENCOUNTER — Encounter: Payer: Self-pay | Admitting: Family Medicine

## 2020-03-12 ENCOUNTER — Other Ambulatory Visit (INDEPENDENT_AMBULATORY_CARE_PROVIDER_SITE_OTHER): Payer: Medicare HMO

## 2020-03-12 ENCOUNTER — Telehealth (INDEPENDENT_AMBULATORY_CARE_PROVIDER_SITE_OTHER): Payer: Medicare HMO | Admitting: Family Medicine

## 2020-03-12 ENCOUNTER — Other Ambulatory Visit: Payer: Medicare HMO

## 2020-03-12 ENCOUNTER — Other Ambulatory Visit: Payer: Self-pay

## 2020-03-12 VITALS — Temp 98.6°F | Wt 214.0 lb

## 2020-03-12 DIAGNOSIS — Z20822 Contact with and (suspected) exposure to covid-19: Secondary | ICD-10-CM | POA: Diagnosis not present

## 2020-03-12 LAB — POCT INFLUENZA A/B
Influenza A, POC: NEGATIVE
Influenza B, POC: NEGATIVE

## 2020-03-12 LAB — POC COVID19 BINAXNOW: SARS Coronavirus 2 Ag: NEGATIVE

## 2020-03-12 NOTE — Progress Notes (Addendum)
   Subjective:    Patient ID: Jerry Simpson, male    DOB: Apr 21, 1946, 74 y.o.   MRN: [id number]  HPI I connected with  Jerry Simpson on 03/12/20 by a video enabled telemedicine application and verified that I am speaking with the correct person using two identifiers.  Caregility used.  He was at home and I am in my office I discussed the limitations of evaluation and management by telemedicine. The patient expressed understanding and agreed to proceed. His daughter lives with him and his wife who has been sick for the last week and was recently tested positive for Covid.  Since he has been around her for the last week they would like to be tested.  Presently he is having no symptoms.  He did have his vaccination done in March. Apparently his physician does not have the capability of doing testing and since his daughter comes here he was comfortable coming  to [place].  Review of Systems     Objective:   Physical Exam Alert and in no distress. Rapid test was negative.       Assessment & Plan:  Exposure to COVID-19 virus We will wait for the PCR.  Discussed the possibility of a positive test and treatment with IV antibodies.  Also discussed the possibility of increasing difficulty with cough, congestion, fever. I then explained that since he has been exposed, the whole family needs to be sequestered for at least the next 10 to 14 days. 15 minutes spent with patient.

## 2020-03-13 LAB — SARS-COV-2, NAA 2 DAY TAT

## 2020-03-13 LAB — NOVEL CORONAVIRUS, NAA: SARS-CoV-2, NAA: NOT DETECTED

## 2020-03-14 NOTE — Telephone Encounter (Signed)
Patient called back questioning shot that Dr. Birdie Riddle had mentioned.  Patient spoke with Jeanett Schlein and she gave [place] Location his information for scheduling of this post covid injection - [place] will reach out of patient to schedule injection.  Patient voiced understanding.

## 2020-03-14 NOTE — Telephone Encounter (Signed)
Patient called back 3:25 pm - 03/14/2020 - He has still not been able to reach the [place] site about his injection and would like for Jeanett Schlein to give him a call back at [phone]

## 2020-03-18 ENCOUNTER — Other Ambulatory Visit: Payer: Medicare HMO

## 2020-03-18 DIAGNOSIS — Z20822 Contact with and (suspected) exposure to covid-19: Secondary | ICD-10-CM

## 2020-03-19 LAB — SARS-COV-2, NAA 2 DAY TAT

## 2020-03-19 LAB — NOVEL CORONAVIRUS, NAA: SARS-CoV-2, NAA: NOT DETECTED

## 2020-03-21 ENCOUNTER — Telehealth: Payer: Self-pay | Admitting: Cardiology

## 2020-03-21 ENCOUNTER — Telehealth (HOSPITAL_COMMUNITY): Payer: Self-pay | Admitting: Cardiology

## 2020-03-21 NOTE — Telephone Encounter (Signed)
Message sent to scheduling team to call and arrange.

## 2020-03-21 NOTE — Telephone Encounter (Signed)
Just an FYI. We have made several attempts to contact this patient including sending a letter to schedule or reschedule their ETT. We will be removing the patient from the echo [place].   02/01/20 Pt cancelled and states appt not necessary at this time per scheduler/[facility]       Thank you

## 2020-03-21 NOTE — Telephone Encounter (Signed)
Jerry Simpson is calling to reschedule his ETT. Please advise.

## 2020-03-21 NOTE — Telephone Encounter (Signed)
Routing to [employer].

## 2020-03-21 NOTE — Telephone Encounter (Signed)
I called patient to schedule GXT and he requested November 4th.  He is now scheduled and also scheduled for COVID test. Thank you.

## 2020-03-23 DIAGNOSIS — Z20822 Contact with and (suspected) exposure to covid-19: Secondary | ICD-10-CM | POA: Diagnosis not present

## 2020-03-24 ENCOUNTER — Ambulatory Visit (HOSPITAL_COMMUNITY): Payer: Medicare HMO | Attending: Cardiovascular Disease

## 2020-03-24 ENCOUNTER — Other Ambulatory Visit: Payer: Self-pay

## 2020-03-24 DIAGNOSIS — I2510 Atherosclerotic heart disease of native coronary artery without angina pectoris: Secondary | ICD-10-CM | POA: Insufficient documentation

## 2020-03-24 DIAGNOSIS — R0602 Shortness of breath: Secondary | ICD-10-CM

## 2020-03-24 DIAGNOSIS — E785 Hyperlipidemia, unspecified: Secondary | ICD-10-CM

## 2020-03-24 LAB — ECHOCARDIOGRAM COMPLETE
Area-P 1/2: 3.81 cm2
P 1/2 time: 388 msec
S' Lateral: 2.8 cm

## 2020-03-28 ENCOUNTER — Telehealth: Payer: Self-pay

## 2020-03-28 NOTE — Progress Notes (Signed)
    Chronic Care Management Pharmacy Assistant   Name: Jerry Simpson  MRN: [id number] DOB: 07/29/1945  Reason for Encounter: Medication Review and General Adherence Call   PCP : Midge Minium, MD  Allergies:   Allergies  Allergen Reactions  . Sulfonamide Derivatives Rash    RASH  Because of a history of documented adverse serious drug reaction;Medi Alert bracelet  is recommended  . Claritin [Loratadine]     Pt states it affects his prostate.     Medications: Outpatient Encounter Medications as of 03/28/2020  Medication Sig  . ASPIRIN 81 PO Take by mouth.  . fexofenadine (ALLEGRA) 180 MG tablet Take 180 mg by mouth daily as needed for allergies.   . finasteride (PROPECIA) 1 MG tablet Take 0.5 mg by mouth daily. TAKE 1/2 TABLET TOTAL 0.5MG  (Patient not taking: Reported on 03/12/2020)  . finasteride (PROPECIA) 1 MG tablet  (Patient not taking: Reported on 03/12/2020)  . fluticasone (FLONASE) 50 MCG/ACT nasal spray Place 2 sprays into both nostrils daily.  . rosuvastatin (CRESTOR) 10 MG tablet TAKE ONE TABLET BY MOUTH DAILY   No facility-administered encounter medications on file as of 03/28/2020.    Current Diagnosis: Patient Active Problem List   Diagnosis Date Noted  . Murmur 01/24/2020  . SOB (shortness of breath) 01/24/2020  . Overweight (BMI 25.0-29.9) 01/17/2020  . Upper airway cough syndrome 06/12/2015  . Physical exam 07/09/2014  . Sleep apnea 07/09/2014  . Bladder tumor   . Reactive airway disease that is not asthma 05/13/2014  . Blood donor 07/04/2013  . HYPERGLYCEMIA, FASTING 07/16/2010  . Coronary atherosclerosis 10/16/2008  . BENIGN PROSTATIC HYPERTROPHY 10/16/2008  . SKIN CANCER, HX OF 10/16/2008  . COLONIC POLYPS, HX OF 10/16/2008  . ELEVATED PROSTATE SPECIFIC ANTIGEN 06/05/2008  . ELEVATED BLOOD PRESSURE WITHOUT DIAGNOSIS OF HYPERTENSION 06/05/2008  . Hyperlipidemia 09/27/2007  . GERD 06/20/2007  . Allergic rhinitis, cause unspecified  05/05/2007     Have you had any problems recently with your health? Patient states he has an  Allergy like cough.  Have you had any problems with your pharmacy? Patient states he has not had any problems with his pharmacy. What issues or side effects are you having with your medications? Patient states he is not having any side effects with his medications.  What would you like me to pass along to [facility] for them to help you with? Patient states not at this moment What can we do to take care of you better? Patient states Not at this moment  Georgiana Shore ,[place] Pharmacist Assistant [phone]     Follow-Up:  Pharmacist Review

## 2020-04-02 ENCOUNTER — Other Ambulatory Visit: Payer: Self-pay

## 2020-04-02 ENCOUNTER — Encounter: Payer: Self-pay | Admitting: Family Medicine

## 2020-04-02 ENCOUNTER — Telehealth (INDEPENDENT_AMBULATORY_CARE_PROVIDER_SITE_OTHER): Payer: Medicare HMO | Admitting: Family Medicine

## 2020-04-02 VITALS — BP 140/80

## 2020-04-02 DIAGNOSIS — J209 Acute bronchitis, unspecified: Secondary | ICD-10-CM

## 2020-04-02 MED ORDER — PREDNISONE 10 MG PO TABS: ORAL_TABLET | ORAL | 0 refills | Status: DC

## 2020-04-02 MED ORDER — ALBUTEROL SULFATE HFA 108 (90 BASE) MCG/ACT IN AERS: 2.0000 | INHALATION_SPRAY | Freq: Four times a day (QID) | RESPIRATORY_TRACT | 0 refills | Status: DC | PRN

## 2020-04-02 NOTE — Progress Notes (Signed)
° °  Virtual Visit via Video   I connected with patient on 04/02/20 at  2:30 PM EDT by a video enabled telemedicine application and verified that I am speaking with the correct person using two identifiers.  Location patient: Home Location provider: Acup[facility]ist, Office Persons participating in the virtual visit: Patient, Provider, [place] (Jess B)  I discussed the limitations of evaluation and management by telemedicine and the availability of in person appointments. The patient expressed understanding and agreed to proceed.  Subjective:   HPI:   URI- 'it's the same old crud'.  Dry, hacking cough.  sxs started 'a couple of weeks ago'.  Pt reports not feeling ill but unable to speak more than a few words w/o coughing.  Denies SOB.  No fever.  Gave blood yesterday.  Denies sinus pain/pressure.  Minimal nasal congestion.  No abd pain, N/V/D.  No body aches or chills.  ROS:   See pertinent positives and negatives per HPI.  Patient Active Problem List   Diagnosis Date Noted   Murmur 01/24/2020   SOB (shortness of breath) 01/24/2020   Overweight (BMI 25.0-29.9) 01/17/2020   Upper airway cough syndrome 06/12/2015   Physical exam 07/09/2014   Bladder tumor    Reactive airway disease that is not asthma 05/13/2014   Blood donor 07/04/2013   HYPERGLYCEMIA, FASTING 07/16/2010   Coronary atherosclerosis 10/16/2008   BENIGN PROSTATIC HYPERTROPHY 10/16/2008   SKIN CANCER, HX OF 10/16/2008   COLONIC POLYPS, HX OF 10/16/2008   ELEVATED BLOOD PRESSURE WITHOUT DIAGNOSIS OF HYPERTENSION 06/05/2008   Hyperlipidemia 09/27/2007   GERD 06/20/2007   Allergic rhinitis, cause unspecified 05/05/2007    Social History   Tobacco Use   Smoking status: Never Smoker   Smokeless tobacco: Never Used  Substance Use Topics   Alcohol use: Yes    Alcohol/week: 7.0 standard drinks    Types: 7 drink(s) per week    Comment:  < 7/ week    Current Outpatient Medications:    ASPIRIN  81 PO, Take by mouth., Disp: , Rfl:    Coenzyme Q10 (CO Q-10) 100 MG CAPS, , Disp: , Rfl:    fexofenadine (ALLEGRA) 180 MG tablet, Take 180 mg by mouth daily as needed for allergies. , Disp: , Rfl:    finasteride (PROPECIA) 1 MG tablet, , Disp: , Rfl:    fluticasone (FLONASE) 50 MCG/ACT nasal spray, Place 2 sprays into both nostrils daily., Disp: 16 g, Rfl: 1   rosuvastatin (CRESTOR) 10 MG tablet, TAKE ONE TABLET BY MOUTH DAILY, Disp: 90 tablet, Rfl: 0  Allergies  Allergen Reactions   Sulfonamide Derivatives Rash    RASH  Because of a history of documented adverse serious drug reaction;Medi Alert bracelet  is recommended   Claritin [Loratadine]     Pt states it affects his prostate.     Objective:   BP 140/80  AAOx3, NAD NCAT, EOMI No obvious CN deficits Coloring WNL Pt is able to speak clearly, coherently without shortness of breath or increased work of breathing.  + dry, repetitive cough Thought process is linear.  Mood is appropriate.   Assessment and Plan:   Acute bronchitis- new.  Pt reports similar sxs previously.  He is well appearing and has no other sxs than his dry, spasmodic cough.  Start Prednisone taper and Albuterol HFA and pt to alert if no improvement or worsening.  Pt expressed understanding and is in agreement w/ plan.    Annye Asa, MD 04/02/2020

## 2020-04-02 NOTE — Progress Notes (Signed)
I have discussed the procedure for the virtual visit with the patient who has given consent to proceed with assessment and treatment.   Julisa Flippo L Stanislaw Acton, CMA     

## 2020-04-04 ENCOUNTER — Telehealth: Payer: Self-pay | Admitting: Family Medicine

## 2020-04-04 NOTE — Telephone Encounter (Signed)
Pt called in asking if a z-pac or an antibiotic can be sent into the [facility] [place]. He is aware that Dr. Birdie Riddle is already gone for the day but asked if Einar Pheasant would be willing to do it. He is doing everything that Tabori recommended him to do but it isn't getting any better. Pt didn't want it to get worse over the weekend.

## 2020-04-04 NOTE — Telephone Encounter (Signed)
Would defer to PCP or he can be seen at Saturday clinic for reassessment if he feels things have worsened since appointment with PCP 2 days ago.

## 2020-04-04 NOTE — Telephone Encounter (Signed)
Please advise 

## 2020-04-04 NOTE — Telephone Encounter (Signed)
Pt has been scheduled for the Saturday clinic

## 2020-04-05 ENCOUNTER — Telehealth: Payer: Medicare HMO | Admitting: Family Medicine

## 2020-04-21 ENCOUNTER — Other Ambulatory Visit (HOSPITAL_COMMUNITY)
Admission: RE | Admit: 2020-04-21 | Discharge: 2020-04-21 | Disposition: A | Discharge status: Home / Self Care | Payer: Medicare HMO | Source: Ambulatory Visit | Attending: Cardiology | Admitting: Cardiology

## 2020-04-21 DIAGNOSIS — Z01812 Encounter for preprocedural laboratory examination: Secondary | ICD-10-CM | POA: Diagnosis not present

## 2020-04-21 DIAGNOSIS — Z20822 Contact with and (suspected) exposure to covid-19: Secondary | ICD-10-CM | POA: Insufficient documentation

## 2020-04-21 LAB — SARS CORONAVIRUS 2 (TAT 6-24 HRS): SARS Coronavirus 2: NEGATIVE

## 2020-04-23 ENCOUNTER — Telehealth (HOSPITAL_COMMUNITY): Payer: Self-pay | Admitting: *Deleted

## 2020-04-23 NOTE — Telephone Encounter (Signed)
Close encounter 

## 2020-04-24 ENCOUNTER — Other Ambulatory Visit: Payer: Self-pay

## 2020-04-24 ENCOUNTER — Ambulatory Visit (HOSPITAL_COMMUNITY)
Admission: RE | Admit: 2020-04-24 | Discharge: 2020-04-24 | Disposition: A | Discharge status: Home / Self Care | Payer: Medicare HMO | Source: Ambulatory Visit | Attending: Cardiology | Admitting: Cardiology

## 2020-04-24 DIAGNOSIS — R0602 Shortness of breath: Secondary | ICD-10-CM | POA: Insufficient documentation

## 2020-04-24 DIAGNOSIS — I2510 Atherosclerotic heart disease of native coronary artery without angina pectoris: Secondary | ICD-10-CM | POA: Diagnosis not present

## 2020-04-24 DIAGNOSIS — E785 Hyperlipidemia, unspecified: Secondary | ICD-10-CM

## 2020-04-24 LAB — EXERCISE TOLERANCE TEST
Estimated workload: 5.8 METS
Exercise duration (min): 4 min
Exercise duration (sec): 3 s
MPHR: 148 {beats}/min
Peak HR: 129 {beats}/min
Percent HR: 87 %
Rest HR: 87 {beats}/min

## 2020-04-29 DIAGNOSIS — R69 Illness, unspecified: Secondary | ICD-10-CM | POA: Diagnosis not present

## 2020-04-30 DIAGNOSIS — R69 Illness, unspecified: Secondary | ICD-10-CM | POA: Diagnosis not present

## 2020-05-06 ENCOUNTER — Other Ambulatory Visit: Payer: Self-pay

## 2020-05-06 ENCOUNTER — Encounter: Payer: Self-pay | Admitting: Family Medicine

## 2020-05-06 ENCOUNTER — Ambulatory Visit (INDEPENDENT_AMBULATORY_CARE_PROVIDER_SITE_OTHER): Payer: Medicare HMO

## 2020-05-06 DIAGNOSIS — Z23 Encounter for immunization: Secondary | ICD-10-CM | POA: Diagnosis not present

## 2020-06-02 ENCOUNTER — Telehealth: Payer: Medicare HMO

## 2020-06-19 ENCOUNTER — Ambulatory Visit: Payer: Medicare HMO

## 2020-06-19 ENCOUNTER — Other Ambulatory Visit: Payer: Self-pay

## 2020-06-19 NOTE — Progress Notes (Unsigned)
Chronic Care Management Pharmacy  Name: KYE HEDDEN  MRN: [id number] DOB: 1945-10-02  Chief Complaint/ HPI  Jerry Simpson,  74 y.o. , male presents for their Initial CCM visit with the clinical pharmacist via telephone due to COVID-19 Pandemic. Patient is accompanied by wife today. Expresses concern for wife's well being but denies any issues or complaints with his own health at this time.   Patient's LDL/TG elevated at last visit and was restarted on Crestor 10 mg daily.  Extremely satisfied with previous prostatic artery embolization and denies any urinary issues since.   PCP : Midge Minium, MD  Chronic conditions include:  No diagnosis found.  Patient Active Problem List   Diagnosis Date Noted  . Murmur 01/24/2020  . SOB (shortness of breath) 01/24/2020  . Overweight (BMI 25.0-29.9) 01/17/2020  . Upper airway cough syndrome 06/12/2015  . Physical exam 07/09/2014  . Bladder tumor   . Reactive airway disease that is not asthma 05/13/2014  . Blood donor 07/04/2013  . HYPERGLYCEMIA, FASTING 07/16/2010  . Coronary atherosclerosis 10/16/2008  . BENIGN PROSTATIC HYPERTROPHY 10/16/2008  . SKIN CANCER, HX OF 10/16/2008  . COLONIC POLYPS, HX OF 10/16/2008  . ELEVATED BLOOD PRESSURE WITHOUT DIAGNOSIS OF HYPERTENSION 06/05/2008  . Hyperlipidemia 09/27/2007  . GERD 06/20/2007  . Allergic rhinitis, cause unspecified 05/05/2007   Past Surgical History:  Procedure Laterality Date  . ANGIOPLASTY  2004   stent placement ;sees Dr Ron Parker  . CATARACT EXTRACTION, BILATERAL  2012  . COLONOSCOPY    . colonoscopy with polypectomy     X2, Dr Henrene Pastor  . POLYPECTOMY    . prostatic artery embolization    . TONSILLECTOMY  1962   Social History   Socioeconomic History  . Marital status: Married    Spouse name: Not on file  . Number of children: Not on file  . Years of education: Not on file  . Highest education level: Not on file  Occupational History  . Not on file   Tobacco Use  . Smoking status: Never Smoker  . Smokeless tobacco: Never Used  Vaping Use  . Vaping Use: Never used  Substance and Sexual Activity  . Alcohol use: Yes    Alcohol/week: 7.0 standard drinks    Types: 7 drink(s) per week    Comment:  < 7/ week  . Drug use: No  . Sexual activity: Not on file  Other Topics Concern  . Not on file  Social History Narrative   Lives with wife.     Social Determinants of Health   Financial Resource Strain: Not on file  Food Insecurity: No Food Insecurity  . Worried About Charity fundraiser in the Last Year: Never true  . Ran Out of Food in the Last Year: Never true  Transportation Needs: No Transportation Needs  . Lack of Transportation (Medical): No  . Lack of Transportation (Non-Medical): No  Physical Activity: Not on file  Stress: Not on file  Social Connections: Not on file   Family History  Problem Relation Age of Onset  . Hypertension Father   . Heart attack Father 58  . Diabetes Father   . Cancer Brother        lung cancer ; leukemia terminally  . Heart attack Brother 17  . Heart disease Paternal Uncle 59  . Stroke Maternal Grandmother        >65  . Colon cancer Neg Hx   . Stomach cancer Neg Hx   .  Colon polyps Neg Hx   . Esophageal cancer Neg Hx   . Rectal cancer Neg Hx    Allergies  Allergen Reactions  . Sulfonamide Derivatives Rash    RASH  Because of a history of documented adverse serious drug reaction;Medi Alert bracelet  is recommended  . Claritin [Loratadine]     Pt states it affects his prostate.    Outpatient Encounter Medications as of 06/19/2020  Medication Sig Note  . albuterol (VENTOLIN HFA) 108 (90 Base) MCG/ACT inhaler Inhale 2 puffs into the lungs every 6 (six) hours as needed for wheezing or shortness of breath.   . ASPIRIN 81 PO Take by mouth.   . Coenzyme Q10 (CO Q-10) 100 MG CAPS    . fexofenadine (ALLEGRA) 180 MG tablet Take 180 mg by mouth daily as needed for allergies.    .  finasteride (PROPECIA) 1 MG tablet  04/02/2020: Pt takes 1/4  . fluticasone (FLONASE) 50 MCG/ACT nasal spray Place 2 sprays into both nostrils daily.   . predniSONE (DELTASONE) 10 MG tablet 3 tabs x3 days and then 2 tabs x3 days and then 1 tab x3 days.  Take w/ food.   . rosuvastatin (CRESTOR) 10 MG tablet TAKE ONE TABLET BY MOUTH DAILY    No facility-administered encounter medications on file as of 06/19/2020.   Patient Care Team    Relationship Specialty Notifications Start End  Midge Minium, MD PCP - General Family Medicine  04/18/14    Comment: Nestor Ramp, Docia Chuck, MD Consulting Physician Gastroenterology  07/09/14   Beryle Lathe, MD Referring Physician General Surgery  08/18/15    Comment: Urology  [facility], [place], MD Attending Physician   12/29/17    Comment: Vernona Rieger, Jenny Reichmann, MD  Dermatology  12/29/17   [name], [facility] Pharmacist Pharmacist  10/12/19    Comment: PHONE NUMBER [phone]   Current Diagnosis/Assessment: Goals Addressed   None    Hyperlipidemia / Coronary atherosclerosis    Hepatic Function Latest Ref Rng & Units 01/16/2020 05/22/2019 05/23/2018  Total Protein 6.0 - 8.3 g/dL 7.4 7.3 6.9  Albumin 3.5 - 5.2 g/dL 4.3 4.5 4.3  AST 0 - 37 U/L _0 ALT 0 - 53 U/L _1 Alk Phosphatase 39 - 117 U/L 49 59 53  Total Bilirubin 0.2 - 1.2 mg/dL 0.6 0.7 0.6  Bilirubin, Direct 0.0 - 0.3 mg/dL 0.1 0.1 0.1      Component Value Date/Time   CHOL 134 01/16/2020 0837   TRIG 207.0 (H) 01/16/2020 0837   TRIG 92 06/09/2006 0818   HDL 27.50 (L) 01/16/2020 0837   LDLCALC 113 (H) 03/14/2018 0914   LDLDIRECT 86.0 01/16/2020 0837    The 10-year ASCVD risk score Mikey Bussing DC Jr., et al., 2013) is: 25.1%   Values used to calculate the score:     Age: 45 years     Sex: Male     Is Non-Hispanic African American: No     Diabetic: No     Tobacco smoker: No     Systolic Blood Pressure: 397 mmHg     Is BP treated: No     HDL Cholesterol: 27.5 mg/dL      Total Cholesterol: 134 mg/dL   LDL/TG elevated at last visit - restarted rosuvastatin 10 mg daily. No longer taking red yeast rice extract. Denies any muscle or abdominal pain or n/v. Patient is currently controlled on the following medications: . Rouvastatin 10 mg daily  Coronary Atherosclerosis. On ASA for cvd prevention. Denies any abnormal bruising, bleeding from nose or gums or blood in urine or stool. Patient is currently controlled on the following medications:  . Aspirin 81 mg daily   Plan  Continue current medications.    BPH     Lab Results  Component Value Date   PSA 2.02 05/22/2019   PSA 0.99 03/14/2018   PSA 5.83 (H) 03/10/2017   PSA at goal, no medication management. Denies urinary symptoms urinary frequency, incomplete voiding, weak stream. Has been controlled since prostatic artery embolization.  Plan  Continue current medications.  Chronic rhinitis   No complaint of ongoing rhinitis. Patient is currently controlled on the following medications:  . Fexofenadine 180 mg daily  . Fluticasone 50 mcg/act - 2 sprays into both nostrils daily as needed for allergies   Plan  Continue current medications.  Vaccines   Reviewed and discussed patient's vaccination history.    Immunization History  Administered Date(s) Administered  . Fluad Quad(high Dose 65+) 05/06/2020  . Influenza Split 03/30/2011, 04/10/2012  . Influenza Whole 04/25/2007, 04/03/2009, 06/05/2010  . Influenza, High Dose Seasonal PF 05/08/2013, 04/02/2015, 04/08/2016, 03/09/2019  . Influenza,inj,Quad PF,6+ Mos 04/18/2014, 03/14/2017  . Moderna Sars-Covid-2 Vaccination 08/02/2019, 08/31/2019  . Pneumococcal Conjugate-13 07/04/2013  . Pneumococcal Polysaccharide-23 07/09/2014  . Tdap 06/30/2012  . Zoster 03/30/2011   Plan  Recommended patient receive Shingrix vaccine in pharmacy.   Medication Management   Receives prescription medications from:  [facility] [address],  [place] [address] Phone: [phone] Fax: [phone]  Denies any issues with current medication management.   Plan  Continue current medication management strategy.  Follow up: 6 month phone visit. ______________ Visit Information SDOH (Social Determinants of Health) assessments performed: Yes.  Mr. Commisso was given information about Chronic Care Management services today including:  1. CCM service includes personalized support from designated clinical staff supervised by his physician, including individualized plan of care and coordination with other care providers 2. 24/7 contact phone numbers for assistance for urgent and routine care needs. 3. Standard insurance, coinsurance, copays and deductibles apply for chronic care management only during months in which we provide at least 20 minutes of these services. Most insurances cover these services at 100%, however patients may be responsible for any copay, coinsurance and/or deductible if applicable. This service may help you avoid the need for more expensive face-to-face services. 4. Only one practitioner may furnish and bill the service in a calendar month. 5. The patient may stop CCM services at any time (effective at the end of the month) by phone call to the office staff.  Patient agreed to services and verbal consent obtained.   [name], Pharm.D., BCGP Clinical Pharmacist [facility] at [facility] [phone]

## 2020-06-25 ENCOUNTER — Telehealth: Payer: Self-pay | Admitting: Family Medicine

## 2020-06-25 NOTE — Telephone Encounter (Signed)
Left message for patient to schedule Annual Wellness Visit.  Please schedule with Nurse Health Advisor Martha Stanley, RN at [place]  

## 2020-07-03 ENCOUNTER — Ambulatory Visit: Payer: Medicare HMO

## 2020-07-03 DIAGNOSIS — E785 Hyperlipidemia, unspecified: Secondary | ICD-10-CM

## 2020-07-03 DIAGNOSIS — I2510 Atherosclerotic heart disease of native coronary artery without angina pectoris: Secondary | ICD-10-CM

## 2020-07-03 NOTE — Patient Instructions (Addendum)
Mr. Shahan,  Thank you for taking the time to review your medications with me today.  I have included our care plan/goals in the following pages. Please review and call me with any questions!  Thanks! Ellin Mayhew, Pharm.D., BCGP Clinical Pharmacist [facility] at [facility] [phone]  Goals Addressed            This Visit's Progress   . PharmD Care Plan   On track    CARE PLAN ENTRY  Current Barriers:  . Chronic Disease Management support, education, and care coordination needs related to Hyperlipidemia, Coronary Artery Disease, and chronic rhinitis   Hyperlipidemia / Coronary Atherosclerosis . Pharmacist Clinical Goal(s): o Over the next 180 days, patient will work with PharmD and providers to achieve LDL goal < 100 . Current regimen:  o Rosuvastatin 10 mg daily . Interventions: o Continue current management . Patient self care activities - Over the next 180 days, patient will: o Continue current management  Chronic Rhinitis . Pharmacist Clinical Goal(s) o Over the next 180 days, patient will work with PharmD and providers to minimize any symptoms of chronic rhinitis.  . Current regimen:  . Fexofenadine 180 mg daily  . Fluticasone 50 mcg/act - 2 sprays into both nostrils daily as needed for allergies  . Interventions: o Continue current management . Patient self care activities - Over the next 180 days, patient will: o Continue current management  Medication management . Pharmacist Clinical Goal(s): o Over the next 180 days, patient will work with PharmD and providers to maintain optimal medication adherence . [facility] . Interventions o Comprehensive medication review performed. o Continue current medication management strategy . Patient self care activities - Over the next 180 days, patient will: o Focus on medication adherence by continuing current management o Take medications as prescribed o Report  any questions or concerns to PharmD and/or provider(s)       The patient verbalized understanding of instructions provided today and agreed to receive a MyCart copy of patient instruction and/or educational materials. Telephone follow up appointment with pharmacy team member scheduled for: See next appointment with "Care Management Staff" under "What's Next" below.

## 2020-07-03 NOTE — Progress Notes (Signed)
Chronic Care Management Pharmacy  Name: KOICHI PLATTE  MRN: [id number] DOB: May 05, 1946  Chief Complaint/ HPI  Jerry Simpson,  75 y.o. , male presents for their Follow-Up CCM visit with the clinical pharmacist via telephone due to COVID-19 Pandemic  PCP : Midge Minium, MD  Chronic conditions include:  Encounter Diagnoses  Name Primary?  . Hyperlipidemia, unspecified hyperlipidemia type Yes  . Atherosclerosis of native coronary artery of native heart without angina pectoris     Patient Active Problem List   Diagnosis Date Noted  . Murmur 01/24/2020  . SOB (shortness of breath) 01/24/2020  . Overweight (BMI 25.0-29.9) 01/17/2020  . Upper airway cough syndrome 06/12/2015  . Physical exam 07/09/2014  . Bladder tumor   . Reactive airway disease that is not asthma 05/13/2014  . Blood donor 07/04/2013  . HYPERGLYCEMIA, FASTING 07/16/2010  . Coronary atherosclerosis 10/16/2008  . BENIGN PROSTATIC HYPERTROPHY 10/16/2008  . SKIN CANCER, HX OF 10/16/2008  . COLONIC POLYPS, HX OF 10/16/2008  . ELEVATED BLOOD PRESSURE WITHOUT DIAGNOSIS OF HYPERTENSION 06/05/2008  . Hyperlipidemia 09/27/2007  . GERD 06/20/2007  . Allergic rhinitis, cause unspecified 05/05/2007   Past Surgical History:  Procedure Laterality Date  . ANGIOPLASTY  2004   stent placement ;sees Dr Ron Parker  . CATARACT EXTRACTION, BILATERAL  2012  . COLONOSCOPY    . colonoscopy with polypectomy     X2, Dr Henrene Pastor  . POLYPECTOMY    . prostatic artery embolization    . TONSILLECTOMY  1962   Social History   Socioeconomic History  . Marital status: Married    Spouse name: Not on file  . Number of children: Not on file  . Years of education: Not on file  . Highest education level: Not on file  Occupational History  . Not on file  Tobacco Use  . Smoking status: Never Smoker  . Smokeless tobacco: Never Used  Vaping Use  . Vaping Use: Never used  Substance and Sexual Activity  . Alcohol use: Yes     Alcohol/week: 7.0 standard drinks    Types: 7 drink(s) per week    Comment:  < 7/ week  . Drug use: No  . Sexual activity: Not on file  Other Topics Concern  . Not on file  Social History Narrative   Lives with wife.     Social Determinants of Health   Financial Resource Strain: Not on file  Food Insecurity: No Food Insecurity  . Worried About Charity fundraiser in the Last Year: Never true  . Ran Out of Food in the Last Year: Never true  Transportation Needs: No Transportation Needs  . Lack of Transportation (Medical): No  . Lack of Transportation (Non-Medical): No  Physical Activity: Not on file  Stress: Not on file  Social Connections: Not on file   Family History  Problem Relation Age of Onset  . Hypertension Father   . Heart attack Father 30  . Diabetes Father   . Cancer Brother        lung cancer ; leukemia terminally  . Heart attack Brother 18  . Heart disease Paternal Uncle 54  . Stroke Maternal Grandmother        >65  . Colon cancer Neg Hx   . Stomach cancer Neg Hx   . Colon polyps Neg Hx   . Esophageal cancer Neg Hx   . Rectal cancer Neg Hx    Allergies  Allergen Reactions  . Sulfonamide Derivatives Rash  RASH  Because of a history of documented adverse serious drug reaction;Medi Alert bracelet  is recommended  . Claritin [Loratadine]     Pt states it affects his prostate.    Outpatient Encounter Medications as of 07/03/2020  Medication Sig Note  . ASPIRIN 81 PO Take by mouth.   . Coenzyme Q10 (CO Q-10) 100 MG CAPS    . fexofenadine (ALLEGRA) 180 MG tablet Take 180 mg by mouth daily as needed for allergies.    . finasteride (PROPECIA) 1 MG tablet  04/02/2020: Pt takes 1/4  . fluticasone (FLONASE) 50 MCG/ACT nasal spray Place 2 sprays into both nostrils daily.   . rosuvastatin (CRESTOR) 10 MG tablet TAKE ONE TABLET BY MOUTH DAILY   . [DISCONTINUED] albuterol (VENTOLIN HFA) 108 (90 Base) MCG/ACT inhaler Inhale 2 puffs into the lungs every 6 (six)  hours as needed for wheezing or shortness of breath.   . [DISCONTINUED] predniSONE (DELTASONE) 10 MG tablet 3 tabs x3 days and then 2 tabs x3 days and then 1 tab x3 days.  Take w/ food.    No facility-administered encounter medications on file as of 07/03/2020.   Patient Care Team    Relationship Specialty Notifications Start End  Midge Minium, MD PCP - General Family Medicine  04/18/14    Comment: Nestor Ramp, Docia Chuck, MD Consulting Physician Gastroenterology  07/09/14   Beryle Lathe, MD Referring Physician General Surgery  08/18/15    Comment: Urology  [facility], Michae Kava, MD Attending Physician   12/29/17    Comment: Vernona Rieger, Jenny Reichmann, MD  Dermatology  12/29/17   [name], [facility] Pharmacist Pharmacist  10/12/19    Comment: PHONE NUMBER [phone]   Current Diagnosis/Assessment: Goals Addressed            This Visit's Progress   . PharmD Care Plan   On track    CARE PLAN ENTRY  Current Barriers:  . Chronic Disease Management support, education, and care coordination needs related to Hyperlipidemia, Coronary Artery Disease, and chronic rhinitis   Hyperlipidemia / Coronary Atherosclerosis . Pharmacist Clinical Goal(s): o Over the next 180 days, patient will work with PharmD and providers to achieve LDL goal < 100 . Current regimen:  o Rosuvastatin 10 mg daily . Interventions: o Continue current management . Patient self care activities - Over the next 180 days, patient will: o Continue current management  Chronic Rhinitis . Pharmacist Clinical Goal(s) o Over the next 180 days, patient will work with PharmD and providers to minimize any symptoms of chronic rhinitis.  . Current regimen:  . Fexofenadine 180 mg daily  . Fluticasone 50 mcg/act - 2 sprays into both nostrils daily as needed for allergies  . Interventions: o Continue current management . Patient self care activities - Over the next 180 days, patient will: o Continue current  management  Medication management . Pharmacist Clinical Goal(s): o Over the next 180 days, patient will work with PharmD and providers to maintain optimal medication adherence . [facility] . Interventions o Comprehensive medication review performed. o Continue current medication management strategy . Patient self care activities - Over the next 180 days, patient will: o Focus on medication adherence by continuing current management o Take medications as prescribed o Report any questions or concerns to PharmD and/or provider(s)       Hyperlipidemia / Coronary atherosclerosis    Hepatic Function Latest Ref Rng & Units 01/16/2020 05/22/2019 05/23/2018  Total Protein 6.0 - 8.3 g/dL  7.4 7.3 6.9  Albumin 3.5 - 5.2 g/dL 4.3 4.5 4.3  AST 0 - 37 U/L _0 ALT 0 - 53 U/L _1 Alk Phosphatase 39 - 117 U/L 49 59 53  Total Bilirubin 0.2 - 1.2 mg/dL 0.6 0.7 0.6  Bilirubin, Direct 0.0 - 0.3 mg/dL 0.1 0.1 0.1      Component Value Date/Time   CHOL 134 01/16/2020 0837   TRIG 207.0 (H) 01/16/2020 0837   TRIG 92 06/09/2006 0818   HDL 27.50 (L) 01/16/2020 0837   LDLCALC 113 (H) 03/14/2018 0914   LDLDIRECT 86.0 01/16/2020 0837    The 10-year ASCVD risk score Mikey Bussing DC Jr., et al., 2013) is: 25.1%   Values used to calculate the score:     Age: 53 years     Sex: Male     Is Non-Hispanic African American: No     Diabetic: No     Tobacco smoker: No     Systolic Blood Pressure: 321 mmHg     Is BP treated: No     HDL Cholesterol: 27.5 mg/dL     Total Cholesterol: 134 mg/dL   Doing well on rosuvastatin 10 mg after restarting. Previously would go to [facility] or [facility]. Wanting to become more active, may consider on demand classes through [employer].  Eating less lately so focus on portion control.  -2 eggs in the am, toast -Lunch a few crackers, half a sandwich -Dinner maybe a piece of chicken, no vegetables.  Denies any muscle or abdominal pain or n/v. Patient is  currently controlled on the following medications: . Rouvastatin 10 mg daily   Coronary Atherosclerosis. Denies any abnormal bruising, bleeding from nose or gums or blood in urine or stool. Patient is currently controlled on the following medications:  . Aspirin 81 mg daily   Reviewed diet/exercise - Maintain a healthy weight and exercise regularly, as directed by your health care provider. Eat healthy foods, such as: Lean proteins, complex carbohydrates, fresh fruits and vegetables, low-fat dairy products, healthy fats.  Plan  Continue current medications.    Medication Management   Receives prescription medications from:  [facility] [address], [place] [address] Phone: [phone] Fax: [phone]  Denies any issues with current medication management.   Plan  Continue current medication management strategy.  Follow up: 6 month phone visit. ______________ Visit Information SDOH (Social Determinants of Health) assessments performed: Yes.  [name], Pharm.D., BCGP Clinical Pharmacist [place] [facility] at [facility] [phone]

## 2020-07-05 ENCOUNTER — Other Ambulatory Visit: Payer: Medicare HMO

## 2020-07-05 DIAGNOSIS — Z20822 Contact with and (suspected) exposure to covid-19: Secondary | ICD-10-CM | POA: Diagnosis not present

## 2020-07-08 LAB — NOVEL CORONAVIRUS, NAA: SARS-CoV-2, NAA: NOT DETECTED

## 2020-07-17 ENCOUNTER — Other Ambulatory Visit: Payer: Self-pay

## 2020-07-17 DIAGNOSIS — E785 Hyperlipidemia, unspecified: Secondary | ICD-10-CM

## 2020-07-17 MED ORDER — ROSUVASTATIN CALCIUM 10 MG PO TABS: 10.0000 mg | ORAL_TABLET | Freq: Every day | ORAL | 0 refills | Status: DC

## 2020-07-22 ENCOUNTER — Telehealth: Payer: Self-pay | Admitting: Family Medicine

## 2020-07-22 NOTE — Telephone Encounter (Signed)
Patient has appointment for a physical on 07/24/2020 - they would like lab orders to be sent to [address]. For tomorrow - 07/23/2020.  Please call and let patient know that this has been done.

## 2020-07-22 NOTE — Telephone Encounter (Signed)
Spoke with both patient husband and wife to inform them that Dr.Tabori will order labs for them to be done on tomorrow 07/23/20 at [facility]

## 2020-07-22 NOTE — Telephone Encounter (Signed)
Patient states that Dr. Birdie Riddle does this for them every year.  They like to have their labs done and then discuss them with her when they come in.

## 2020-07-23 ENCOUNTER — Other Ambulatory Visit: Payer: Self-pay

## 2020-07-23 ENCOUNTER — Other Ambulatory Visit (INDEPENDENT_AMBULATORY_CARE_PROVIDER_SITE_OTHER): Payer: Medicare HMO

## 2020-07-23 DIAGNOSIS — E785 Hyperlipidemia, unspecified: Secondary | ICD-10-CM | POA: Diagnosis not present

## 2020-07-23 DIAGNOSIS — Z0000 Encounter for general adult medical examination without abnormal findings: Secondary | ICD-10-CM

## 2020-07-23 DIAGNOSIS — R7309 Other abnormal glucose: Secondary | ICD-10-CM

## 2020-07-23 LAB — CBC WITH DIFFERENTIAL/PLATELET
Basophils Absolute: 0.1 10*3/uL (ref 0.0–0.1)
Basophils Relative: 0.9 % (ref 0.0–3.0)
Eosinophils Absolute: 0.2 10*3/uL (ref 0.0–0.7)
Eosinophils Relative: 2.4 % (ref 0.0–5.0)
HCT: 40.4 % (ref 39.0–52.0)
Hemoglobin: 13.6 g/dL (ref 13.0–17.0)
Lymphocytes Relative: 33.2 % (ref 12.0–46.0)
Lymphs Abs: 2.2 10*3/uL (ref 0.7–4.0)
MCHC: 33.7 g/dL (ref 30.0–36.0)
MCV: 81.1 fl (ref 78.0–100.0)
Monocytes Absolute: 0.6 10*3/uL (ref 0.1–1.0)
Monocytes Relative: 8.4 % (ref 3.0–12.0)
Neutro Abs: 3.6 10*3/uL (ref 1.4–7.7)
Neutrophils Relative %: 55.1 % (ref 43.0–77.0)
Platelets: 257 10*3/uL (ref 150.0–400.0)
RBC: 4.98 Mil/uL (ref 4.22–5.81)
RDW: 14.9 % (ref 11.5–15.5)
WBC: 6.6 10*3/uL (ref 4.0–10.5)

## 2020-07-23 LAB — HEPATIC FUNCTION PANEL
ALT: 21 U/L (ref 0–53)
AST: 21 U/L (ref 0–37)
Albumin: 4.4 g/dL (ref 3.5–5.2)
Alkaline Phosphatase: 55 U/L (ref 39–117)
Bilirubin, Direct: 0.1 mg/dL (ref 0.0–0.3)
Total Bilirubin: 0.5 mg/dL (ref 0.2–1.2)
Total Protein: 7.1 g/dL (ref 6.0–8.3)

## 2020-07-23 LAB — HEMOGLOBIN A1C: Hgb A1c MFr Bld: 5.9 % (ref 4.6–6.5)

## 2020-07-23 LAB — BASIC METABOLIC PANEL
BUN: 19 mg/dL (ref 6–23)
CO2: 32 mEq/L (ref 19–32)
Calcium: 9.7 mg/dL (ref 8.4–10.5)
Chloride: 101 mEq/L (ref 96–112)
Creatinine, Ser: 0.97 mg/dL (ref 0.40–1.50)
GFR: 77.93 mL/min (ref >60.00)
Glucose, Bld: 98 mg/dL (ref 70–99)
Potassium: 4.8 mEq/L (ref 3.5–5.1)
Sodium: 138 mEq/L (ref 135–145)

## 2020-07-23 LAB — TSH: TSH: 1.88 u[IU]/mL (ref 0.35–4.50)

## 2020-07-23 LAB — LDL CHOLESTEROL, DIRECT: Direct LDL: 82 mg/dL

## 2020-07-24 ENCOUNTER — Encounter: Payer: Self-pay | Admitting: Family Medicine

## 2020-07-24 ENCOUNTER — Other Ambulatory Visit: Payer: Self-pay

## 2020-07-24 ENCOUNTER — Other Ambulatory Visit (INDEPENDENT_AMBULATORY_CARE_PROVIDER_SITE_OTHER): Payer: Medicare HMO

## 2020-07-24 ENCOUNTER — Ambulatory Visit (INDEPENDENT_AMBULATORY_CARE_PROVIDER_SITE_OTHER): Payer: Medicare HMO | Admitting: Family Medicine

## 2020-07-24 VITALS — BP 140/60 | HR 74 | Temp 98.0°F | Resp 19 | Ht 74.0 in | Wt 215.6 lb

## 2020-07-24 DIAGNOSIS — E039 Hypothyroidism, unspecified: Secondary | ICD-10-CM

## 2020-07-24 DIAGNOSIS — Z0000 Encounter for general adult medical examination without abnormal findings: Secondary | ICD-10-CM | POA: Diagnosis not present

## 2020-07-24 LAB — LIPID PANEL WITH LDL/HDL RATIO
Cholesterol, Total: 150 mg/dL (ref 100–199)
HDL: 28 mg/dL — ABNORMAL LOW (ref >39)
LDL Chol Calc (NIH): 71 mg/dL (ref 0–99)
LDL/HDL Ratio: 2.5 ratio (ref 0.0–3.6)
Triglycerides: 315 mg/dL — ABNORMAL HIGH (ref 0–149)
VLDL Cholesterol Cal: 51 mg/dL — ABNORMAL HIGH (ref 5–40)

## 2020-07-24 LAB — TSH: TSH: 1.89 u[IU]/mL (ref 0.35–4.50)

## 2020-07-24 MED ORDER — FENOFIBRATE 160 MG PO TABS: 160.0000 mg | ORAL_TABLET | Freq: Every day | ORAL | 1 refills | Status: DC

## 2020-07-24 NOTE — Progress Notes (Signed)
   Subjective:    Patient ID: Jerry Simpson, male    DOB: 1945/11/20, 75 y.o.   MRN: [id number]  HPI CPE- UTD on colonoscopy, pneumonia vaccines, flu shot, COVID vaccines.  Reviewed past medical, surgical, family and social histories.   Patient Care Team    Relationship Specialty Notifications Start End  Midge Minium, MD PCP - General Family Medicine  04/18/14    Comment: Nestor Ramp, Docia Chuck, MD Consulting Physician Gastroenterology  07/09/14   Beryle Lathe, MD Referring Physician General Surgery  08/18/15    Comment: Urology  [facility], [place], MD Attending Physician   12/29/17    Comment: Vernona Rieger, Jenny Reichmann, MD  Dermatology  12/29/17   [name], [facility] Pharmacist Pharmacist  10/12/19    Comment: PHONE NUMBER [phone]    Health Maintenance  Topic Date Due  . COVID-19 Vaccine (3 - Booster for Moderna series) 08/09/2020 (Originally 03/02/2020)  . Hepatitis C Screening  07/24/2021 (Originally [birth year])  . TETANUS/TDAP  06/30/2022  . COLONOSCOPY (Pts 45-36yrs Insurance coverage will need to be confirmed)  10/12/2022  . INFLUENZA VACCINE  Completed  . PNA vac Low Risk Adult  Completed      Review of Systems Patient reports no vision/hearing changes, anorexia, fever ,adenopathy, persistant/recurrent hoarseness, swallowing issues, chest pain, palpitations, edema, persistant/recurrent cough, hemoptysis, dyspnea (rest,exertional, paroxysmal nocturnal), gastrointestinal  bleeding (melena, rectal bleeding), abdominal pain, excessive heart burn, GU symptoms (dysuria, hematuria, voiding/incontinence issues) syncope, focal weakness, memory loss, numbness & tingling, skin/hair/nail changes, depression, anxiety, abnormal bruising/bleeding, musculoskeletal symptoms/signs.   This visit occurred during the SARS-CoV-2 public health emergency.  Safety protocols were in place, including screening questions prior to the visit, additional usage of staff PPE, and extensive cleaning  of exam room while observing appropriate contact time as indicated for disinfecting solutions.       Objective:   Physical Exam General Appearance:    Alert, cooperative, no distress, appears stated age  Head:    Normocephalic, without obvious abnormality, atraumatic  Eyes:    PERRL, conjunctiva/corneas clear, EOM's intact, fundi    benign, both eyes       Ears:    Normal TM's and external ear canals, both ears  Nose:   Nares normal, septum midline, mucosa normal, no drainage   or sinus tenderness  Throat:   Lips, mucosa, and tongue normal; teeth and gums normal  Neck:   Supple, symmetrical, trachea midline, no adenopathy;       thyroid:  No enlargement/tenderness/nodules  Back:     Symmetric, no curvature, ROM normal, no CVA tenderness  Lungs:     Clear to auscultation bilaterally, respirations unlabored  Chest wall:    No tenderness or deformity  Heart:    Regular rate and rhythm, S1 and S2 normal, no murmur, rub   or gallop  Abdomen:     Soft, non-tender, bowel sounds active all four quadrants,    no masses, no organomegaly  Genitalia:    Deferred to urology  Rectal:    Extremities:   Extremities normal, atraumatic, no cyanosis or edema  Pulses:   2+ and symmetric all extremities  Skin:   Skin color, texture, turgor normal, no rashes or lesions  Lymph nodes:   Cervical, supraclavicular, and axillary nodes normal  Neurologic:   CNII-XII intact. Normal strength, sensation and reflexes      throughout          Assessment & Plan:

## 2020-07-24 NOTE — Assessment & Plan Note (Signed)
Pt's PE WNL.  UTD on colonoscopy and immunizations.  Reviewed labs done yesterday and the only change required is starting Fenofibrate 160mg  daily.  Encouraged healthy diet and regular exercise.  Anticipatory guidance provided.

## 2020-07-24 NOTE — Progress Notes (Signed)
results reviewed via Mychart.

## 2020-07-24 NOTE — Patient Instructions (Addendum)
Follow up in 6 months to recheck cholesterol We're going to add Fenofibrate to help lower the triglycerides Continue to work on healthy diet and regular exercise- you can do it! Call with any questions or concerns Stay Safe!  Stay Healthy!

## 2020-07-25 NOTE — Progress Notes (Signed)
Subjective:   Jerry Simpson is a 75 y.o. male who presents for Medicare Annual/Subsequent preventive examination.  I connected with  [name] today by a video enabled telemedicine application and verified that I am speaking with the correct person using two identifiers.  Location of patient:Home Location of provider:Work  Persons participating in the virtual visit: patient, nurse.   I discussed the limitations, risk, security and privacy concerns of evaluation and management by telemedicine. The patient expressed understanding and agreed to proceed.  Some vital signs may be absent or patient reported.   Review of Systems     Cardiac Risk Factors include: advanced age (>27men, >20 women);dyslipidemia;sedentary lifestyle     Objective:    Today's Vitals   07/28/20 0941  Weight: 215 lb (97.5 kg)  Height: 6\' 2"  (1.88 m)   Body mass index is 27.6 kg/m.  Advanced Directives 07/28/2020 12/29/2017 10/11/2017 10/04/2017 12/15/2016  Does Patient Have a Medical Advance Directive? Yes Yes Yes Yes Yes  Type of Paramedic of [place];Living will [place];Living will - - [place];Living will  Copy of [place] in Chart? Yes - validated most recent copy scanned in chart (See row information) No - copy requested - - No - copy requested    Current Medications (verified) Outpatient Encounter Medications as of 07/28/2020  Medication Sig  . ASPIRIN 81 PO Take by mouth.  . Coenzyme Q10 (CO Q-10) 100 MG CAPS   . fenofibrate 160 MG tablet Take 1 tablet (160 mg total) by mouth daily.  . fexofenadine (ALLEGRA) 180 MG tablet Take 180 mg by mouth daily as needed for allergies.   . finasteride (PROPECIA) 1 MG tablet   . fluticasone (FLONASE) 50 MCG/ACT nasal spray Place 2 sprays into both nostrils daily.  . rosuvastatin (CRESTOR) 10 MG tablet Take 1 tablet (10 mg total) by mouth daily.   No facility-administered  encounter medications on file as of 07/28/2020.    Allergies (verified) Sulfonamide derivatives and Claritin [loratadine]   History: Past Medical History:  Diagnosis Date  . Adenomatous polyps   . Allergy   . Borderline systolic HTN   . BPH (benign prostatic hyperplasia)    elevated PSA, Dr Jeffie Pollock  . CAD (coronary artery disease)   . Cataract    removed both eyes   . Environmental allergies   . GERD (gastroesophageal reflux disease)   . Hyperlipidemia    Past Surgical History:  Procedure Laterality Date  . ANGIOPLASTY  2004   stent placement ;sees Dr Ron Parker  . CATARACT EXTRACTION, BILATERAL  2012  . COLONOSCOPY    . colonoscopy with polypectomy     X2, Dr Henrene Pastor  . POLYPECTOMY    . prostatic artery embolization    . TONSILLECTOMY  1962   Family History  Problem Relation Age of Onset  . Hypertension Father   . Heart attack Father 45  . Diabetes Father   . Cancer Brother        lung cancer ; leukemia terminally  . Heart attack Brother 42  . Heart disease Paternal Uncle 55  . Stroke Maternal Grandmother        >65  . Colon cancer Neg Hx   . Stomach cancer Neg Hx   . Colon polyps Neg Hx   . Esophageal cancer Neg Hx   . Rectal cancer Neg Hx    Social History   Socioeconomic History  . Marital status: Married  Spouse name: Not on file  . Number of children: Not on file  . Years of education: Not on file  . Highest education level: Not on file  Occupational History  . Not on file  Tobacco Use  . Smoking status: Never Smoker  . Smokeless tobacco: Never Used  Vaping Use  . Vaping Use: Never used  Substance and Sexual Activity  . Alcohol use: Yes    Alcohol/week: 7.0 standard drinks    Types: 7 drink(s) per week    Comment:  < 7/ week  . Drug use: No  . Sexual activity: Not on file  Other Topics Concern  . Not on file  Social History Narrative   Lives with wife.     Social Determinants of Health   Financial Resource Strain: Low Risk   . Difficulty of  Paying Living Expenses: Not hard at all  Food Insecurity: No Food Insecurity  . Worried About Charity fundraiser in the Last Year: Never true  . Ran Out of Food in the Last Year: Never true  Transportation Needs: No Transportation Needs  . Lack of Transportation (Medical): No  . Lack of Transportation (Non-Medical): No  Physical Activity: Unknown  . Days of Exercise per Week: Not on file  . Minutes of Exercise per Session: 0 min  Stress: No Stress Concern Present  . Feeling of Stress : Not at all  Social Connections: Moderately Integrated  . Frequency of Communication with Friends and Family: More than three times a week  . Frequency of Social Gatherings with Friends and Family: Once a week  . Attends Religious Services: 1 to 4 times per year  . Active Member of Clubs or Organizations: No  . Attends Archivist Meetings: Never  . Marital Status: Married    Tobacco Counseling Counseling given: Not Answered   Clinical Intake:  Pre-visit preparation completed: Yes  Pain : No/denies pain     Nutritional Status: BMI 25 -29 Overweight Nutritional Risks: None Diabetes: No  How often do you need to have someone help you when you read instructions, pamphlets, or other written materials from your doctor or pharmacy?: 1 - Never  Diabetic?No  Interpreter Needed?: No  Information entered by :: Caroleen Hamman LPN   Activities of Daily Living In your present state of health, do you have any difficulty performing the following activities: 07/28/2020 07/24/2020  Hearing? N N  Vision? N N  Difficulty concentrating or making decisions? N N  Walking or climbing stairs? N N  Dressing or bathing? N N  Doing errands, shopping? N N  Preparing Food and eating ? N -  Using the Toilet? N -  In the past six months, have you accidently leaked urine? N -  Do you have problems with loss of bowel control? N -  Managing your Medications? N -  Managing your Finances? N -  Housekeeping  or managing your Housekeeping? N -  Some recent data might be hidden    Patient Care Team: Midge Minium, MD as PCP - General (Family Medicine) Irene Shipper, MD as Consulting Physician (Gastroenterology) Beryle Lathe, MD as Referring Physician (General Surgery) [facility], Michae Kava, MD as Attending Physician Allyn Kenner, MD (Dermatology) [name], [facility] as Pharmacist (Pharmacist)  Indicate any recent Medical Services you may have received from other than [facility] providers in the past year (date may be approximate).     Assessment:   This is a routine wellness examination for  Jerry Simpson.  Hearing/Vision screen  Hearing Screening   125Hz  250Hz  500Hz  1000Hz  2000Hz  3000Hz  4000Hz  6000Hz  8000Hz   Right ear:           Left ear:           Comments: No issues  Vision Screening Comments: Reading glasses Last eye exam-2021-Dr. Tanner  Dietary issues and exercise activities discussed: Current Exercise Habits: The patient does not participate in regular exercise at present, Exercise limited by: None identified  Goals    . Increase physical activity     Increase activity    . Patient Stated     Eat healthier    . PharmD Care Plan     CARE PLAN ENTRY  Current Barriers:  . Chronic Disease Management support, education, and care coordination needs related to Hyperlipidemia, Coronary Artery Disease, and chronic rhinitis   Hyperlipidemia / Coronary Atherosclerosis . Pharmacist Clinical Goal(s): o Over the next 180 days, patient will work with PharmD and providers to achieve LDL goal < 100 . Current regimen:  o Rosuvastatin 10 mg daily . Interventions: o Continue current management . Patient self care activities - Over the next 180 days, patient will: o Continue current management  Chronic Rhinitis . Pharmacist Clinical Goal(s) o Over the next 180 days, patient will work with PharmD and providers to minimize any symptoms of chronic rhinitis.  . Current regimen:   . Fexofenadine 180 mg daily  . Fluticasone 50 mcg/act - 2 sprays into both nostrils daily as needed for allergies  . Interventions: o Continue current management . Patient self care activities - Over the next 180 days, patient will: o Continue current management  Medication management . Pharmacist Clinical Goal(s): o Over the next 180 days, patient will work with PharmD and providers to maintain optimal medication adherence . [facility] . Interventions o Comprehensive medication review performed. o Continue current medication management strategy . Patient self care activities - Over the next 180 days, patient will: o Focus on medication adherence by continuing current management o Take medications as prescribed o Report any questions or concerns to PharmD and/or provider(s)       Depression Screen PHQ 2/9 Scores 07/28/2020 07/24/2020 04/02/2020 01/17/2020 05/24/2019 04/11/2019 03/16/2018  PHQ - 2 Score 0 0 0 0 0 0 0  PHQ- 9 Score - 0 - 0 0 - 0    Fall Risk Fall Risk  07/28/2020 07/24/2020 04/02/2020 01/17/2020 05/24/2019  Falls in the past year? 0 0 0 0 0  Number falls in past yr: 0 0 0 0 0  Injury with Fall? 0 0 0 0 0  Risk for fall due to : - No Fall Risks - - -  Follow up Falls prevention discussed - Falls evaluation completed Falls evaluation completed Falls evaluation completed    Rapides:  Any stairs in or around the home? Yes  If so, are there any without handrails? No  Home free of loose throw rugs in walkways, pet beds, electrical cords, etc? Yes  Adequate lighting in your home to reduce risk of falls? Yes   ASSISTIVE DEVICES UTILIZED TO PREVENT FALLS:  Life alert? No  Use of a cane, walker or w/c? No  Grab bars in the bathroom? No  Shower chair or bench in shower? No  Elevated toilet seat or a handicapped toilet? No   TIMED UP AND GO:  Was the test performed? No . Virtual visit   Cognitive Function:Normal  cognitive  status assessed by direct observation by this Nurse Health Advisor. No abnormalities found.          Immunizations Immunization History  Administered Date(s) Administered  . Fluad Quad(high Dose 65+) 05/06/2020  . Influenza Split 03/30/2011, 04/10/2012  . Influenza Whole 04/25/2007, 04/03/2009, 06/05/2010  . Influenza, High Dose Seasonal PF 05/08/2013, 04/02/2015, 04/08/2016, 03/09/2019  . Influenza,inj,Quad PF,6+ Mos 04/18/2014, 03/14/2017  . Moderna Sars-Covid-2 Vaccination 08/02/2019, 08/31/2019  . Pneumococcal Conjugate-13 07/04/2013  . Pneumococcal Polysaccharide-23 07/09/2014  . Tdap 06/30/2012  . Zoster 03/30/2011    TDAP status: Up to date  Flu Vaccine status: Up to date  Pneumococcal vaccine status: Up to date  Covid-19 vaccine status: Completed vaccines-completed first 2 doses  Qualifies for Shingles Vaccine? Yes   Zostavax completed Yes   Shingrix Completed?: No.    Education has been provided regarding the importance of this vaccine. Patient has been advised to call insurance company to determine out of pocket expense if they have not yet received this vaccine. Advised may also receive vaccine at local pharmacy or Health Dept. Verbalized acceptance and understanding.  Screening Tests Health Maintenance  Topic Date Due  . COVID-19 Vaccine (3 - Booster for Moderna series) 08/09/2020 (Originally 03/02/2020)  . Hepatitis C Screening  07/24/2021 (Originally 09/19/1945)  . TETANUS/TDAP  06/30/2022  . COLONOSCOPY (Pts 45-16yrs Insurance coverage will need to be confirmed)  10/12/2022  . INFLUENZA VACCINE  Completed  . PNA vac Low Risk Adult  Completed    Health Maintenance  There are no preventive care reminders to display for this patient.  Colorectal cancer screening: Type of screening: Colonoscopy. Completed 10/11/2017. Repeat every 5 years  Lung Cancer Screening: (Low Dose CT Chest recommended if Age 71-80 years, 30 pack-year currently smoking OR have  quit w/in 15years.) does not qualify.     Additional Screening:  Hepatitis C Screening: does qualify; Discuss with PCP  Vision Screening: Recommended annual ophthalmology exams for early detection of glaucoma and other disorders of the eye. Is the patient up to date with their annual eye exam?  Yes  Who is the provider or what is the name of the office in which the patient attends annual eye exams? Dr. Satira Sark   Dental Screening: Recommended annual dental exams for proper oral hygiene  Community Resource Referral / Chronic Care Management: CRR required this visit?  No   CCM required this visit?  No      Plan:     I have personally reviewed and noted the following in the patient's chart:   . Medical and social history . Use of alcohol, tobacco or illicit drugs  . Current medications and supplements . Functional ability and status . Nutritional status . Physical activity . Advanced directives . List of other physicians . Hospitalizations, surgeries, and ER visits in previous 12 months . Vitals . Screenings to include cognitive, depression, and falls . Referrals and appointments  In addition, I have reviewed and discussed with patient certain preventive protocols, quality metrics, and best practice recommendations. A written personalized care plan for preventive services as well as general preventive health recommendations were provided to patient.   Due to this being a virtual visit, the after visit summary with patients personalized plan was offered to patient via mail or my-chart.  Patient would like to access on my-chart.   Marta Antu, LPN   01/22/6961  Nurse Health Advisor  Nurse Notes: None

## 2020-07-28 ENCOUNTER — Ambulatory Visit (INDEPENDENT_AMBULATORY_CARE_PROVIDER_SITE_OTHER): Payer: Medicare HMO

## 2020-07-28 VITALS — Ht 74.0 in | Wt 215.0 lb

## 2020-07-28 DIAGNOSIS — Z0000 Encounter for general adult medical examination without abnormal findings: Secondary | ICD-10-CM

## 2020-07-28 NOTE — Patient Instructions (Signed)
Jerry Simpson , Thank you for taking time to complete your Medicare Wellness Visit. I appreciate your ongoing commitment to your health goals. Please review the following plan we discussed and let me know if I can assist you in the future.   Screening recommendations/referrals: Colonoscopy: Completed 10/11/2017-Due-10/12/2022 Recommended yearly ophthalmology/optometry visit for glaucoma screening and checkup Recommended yearly dental visit for hygiene and checkup  Vaccinations: Influenza vaccine:Up to date  Pneumococcal vaccine: Completed vaccines Tdap vaccine: Up to date-Due-06/30/2022 Shingles vaccine: Discuss with pharmacy Covid-19: Completed two doses  Advanced directives: Please bring a copy for your chart.  Conditions/risks identified: See problem list  Next appointment: Follow up in one year for your annual wellness visit. 08/03/2021 @ 9:45  Preventive Care 65 Years and Older, Male Preventive care refers to lifestyle choices and visits with your health care provider that can promote health and wellness. What does preventive care include?  A yearly physical exam. This is also called an annual well check.  Dental exams once or twice a year.  Routine eye exams. Ask your health care provider how often you should have your eyes checked.  Personal lifestyle choices, including:  Daily care of your teeth and gums.  Regular physical activity.  Eating a healthy diet.  Avoiding tobacco and drug use.  Limiting alcohol use.  Practicing safe sex.  Taking low doses of aspirin every day.  Taking vitamin and mineral supplements as recommended by your health care provider. What happens during an annual well check? The services and screenings done by your health care provider during your annual well check will depend on your age, overall health, lifestyle risk factors, and family history of disease. Counseling  Your health care provider may ask you questions about your:  Alcohol  use.  Tobacco use.  Drug use.  Emotional well-being.  Home and relationship well-being.  Sexual activity.  Eating habits.  History of falls.  Memory and ability to understand (cognition).  Work and work Statistician. Screening  You may have the following tests or measurements:  Height, weight, and BMI.  Blood pressure.  Lipid and cholesterol levels. These may be checked every 5 years, or more frequently if you are over 78 years old.  Skin check.  Lung cancer screening. You may have this screening every year starting at age 42 if you have a 30-pack-year history of smoking and currently smoke or have quit within the past 15 years.  Fecal occult blood test (FOBT) of the stool. You may have this test every year starting at age 46.  Flexible sigmoidoscopy or colonoscopy. You may have a sigmoidoscopy every 5 years or a colonoscopy every 10 years starting at age 46.  Prostate cancer screening. Recommendations will vary depending on your family history and other risks.  Hepatitis C blood test.  Hepatitis B blood test.  Sexually transmitted disease (STD) testing.  Diabetes screening. This is done by checking your blood sugar (glucose) after you have not eaten for a while (fasting). You may have this done every 1-3 years.  Abdominal aortic aneurysm (AAA) screening. You may need this if you are a current or former smoker.  Osteoporosis. You may be screened starting at age 53 if you are at high risk. Talk with your health care provider about your test results, treatment options, and if necessary, the need for more tests. Vaccines  Your health care provider may recommend certain vaccines, such as:  Influenza vaccine. This is recommended every year.  Tetanus, diphtheria, and acellular pertussis (Tdap,  Td) vaccine. You may need a Td booster every 10 years.  Zoster vaccine. You may need this after age 41.  Pneumococcal 13-valent conjugate (PCV13) vaccine. One dose is  recommended after age 96.  Pneumococcal polysaccharide (PPSV23) vaccine. One dose is recommended after age 6. Talk to your health care provider about which screenings and vaccines you need and how often you need them. This information is not intended to replace advice given to you by your health care provider. Make sure you discuss any questions you have with your health care provider. Document Released: 07/04/2015 Document Revised: 02/25/2016 Document Reviewed: 04/08/2015 Elsevier Interactive Patient Education  2017 [place] Prevention in the Home Falls can cause injuries. They can happen to people of all ages. There are many things you can do to make your home safe and to help prevent falls. What can I do on the outside of my home?  Regularly fix the edges of walkways and driveways and fix any cracks.  Remove anything that might make you trip as you walk through a door, such as a raised step or threshold.  Trim any bushes or trees on the path to your home.  Use bright outdoor lighting.  Clear any walking paths of anything that might make someone trip, such as rocks or tools.  Regularly check to see if handrails are loose or broken. Make sure that both sides of any steps have handrails.  Any raised decks and porches should have guardrails on the edges.  Have any leaves, snow, or ice cleared regularly.  Use sand or salt on walking paths during winter.  Clean up any spills in your garage right away. This includes oil or grease spills. What can I do in the bathroom?  Use night lights.  Install grab bars by the toilet and in the tub and shower. Do not use towel bars as grab bars.  Use non-skid mats or decals in the tub or shower.  If you need to sit down in the shower, use a plastic, non-slip stool.  Keep the floor dry. Clean up any water that spills on the floor as soon as it happens.  Remove soap buildup in the tub or shower regularly.  Attach bath mats  securely with double-sided non-slip rug tape.  Do not have throw rugs and other things on the floor that can make you trip. What can I do in the bedroom?  Use night lights.  Make sure that you have a light by your bed that is easy to reach.  Do not use any sheets or blankets that are too big for your bed. They should not hang down onto the floor.  Have a firm chair that has side arms. You can use this for support while you get dressed.  Do not have throw rugs and other things on the floor that can make you trip. What can I do in the kitchen?  Clean up any spills right away.  Avoid walking on wet floors.  Keep items that you use a lot in easy-to-reach places.  If you need to reach something above you, use a strong step stool that has a grab bar.  Keep electrical cords out of the way.  Do not use floor polish or wax that makes floors slippery. If you must use wax, use non-skid floor wax.  Do not have throw rugs and other things on the floor that can make you trip. What can I do with my stairs?  Do not  leave any items on the stairs.  Make sure that there are handrails on both sides of the stairs and use them. Fix handrails that are broken or loose. Make sure that handrails are as long as the stairways.  Check any carpeting to make sure that it is firmly attached to the stairs. Fix any carpet that is loose or worn.  Avoid having throw rugs at the top or bottom of the stairs. If you do have throw rugs, attach them to the floor with carpet tape.  Make sure that you have a light switch at the top of the stairs and the bottom of the stairs. If you do not have them, ask someone to add them for you. What else can I do to help prevent falls?  Wear shoes that:  Do not have high heels.  Have rubber bottoms.  Are comfortable and fit you well.  Are closed at the toe. Do not wear sandals.  If you use a stepladder:  Make sure that it is fully opened. Do not climb a closed  stepladder.  Make sure that both sides of the stepladder are locked into place.  Ask someone to hold it for you, if possible.  Clearly mark and make sure that you can see:  Any grab bars or handrails.  First and last steps.  Where the edge of each step is.  Use tools that help you move around (mobility aids) if they are needed. These include:  Canes.  Walkers.  Scooters.  Crutches.  Turn on the lights when you go into a dark area. Replace any light bulbs as soon as they burn out.  Set up your furniture so you have a clear path. Avoid moving your furniture around.  If any of your floors are uneven, fix them.  If there are any pets around you, be aware of where they are.  Review your medicines with your doctor. Some medicines can make you feel dizzy. This can increase your chance of falling. Ask your doctor what other things that you can do to help prevent falls. This information is not intended to replace advice given to you by your health care provider. Make sure you discuss any questions you have with your health care provider. Document Released: 04/03/2009 Document Revised: 11/13/2015 Document Reviewed: 07/12/2014 Elsevier Interactive Patient Education  2017 [employer].

## 2020-08-12 DIAGNOSIS — N5201 Erectile dysfunction due to arterial insufficiency: Secondary | ICD-10-CM | POA: Diagnosis not present

## 2020-08-12 DIAGNOSIS — N400 Enlarged prostate without lower urinary tract symptoms: Secondary | ICD-10-CM | POA: Diagnosis not present

## 2020-08-12 DIAGNOSIS — N401 Enlarged prostate with lower urinary tract symptoms: Secondary | ICD-10-CM | POA: Diagnosis not present

## 2020-08-29 DIAGNOSIS — J309 Allergic rhinitis, unspecified: Secondary | ICD-10-CM | POA: Diagnosis not present

## 2020-08-29 DIAGNOSIS — Z6826 Body mass index (BMI) 26.0-26.9, adult: Secondary | ICD-10-CM | POA: Diagnosis not present

## 2020-08-29 DIAGNOSIS — R030 Elevated blood-pressure reading, without diagnosis of hypertension: Secondary | ICD-10-CM | POA: Diagnosis not present

## 2020-08-29 DIAGNOSIS — Z7982 Long term (current) use of aspirin: Secondary | ICD-10-CM | POA: Diagnosis not present

## 2020-08-29 DIAGNOSIS — E663 Overweight: Secondary | ICD-10-CM | POA: Diagnosis not present

## 2020-08-29 DIAGNOSIS — E785 Hyperlipidemia, unspecified: Secondary | ICD-10-CM | POA: Diagnosis not present

## 2020-08-29 DIAGNOSIS — L659 Nonscarring hair loss, unspecified: Secondary | ICD-10-CM | POA: Diagnosis not present

## 2020-08-29 DIAGNOSIS — Z809 Family history of malignant neoplasm, unspecified: Secondary | ICD-10-CM | POA: Diagnosis not present

## 2020-08-29 DIAGNOSIS — I2510 Atherosclerotic heart disease of native coronary artery without angina pectoris: Secondary | ICD-10-CM | POA: Diagnosis not present

## 2020-08-29 DIAGNOSIS — Z8249 Family history of ischemic heart disease and other diseases of the circulatory system: Secondary | ICD-10-CM | POA: Diagnosis not present

## 2020-09-08 ENCOUNTER — Other Ambulatory Visit: Payer: Self-pay | Admitting: Family Medicine

## 2020-10-14 ENCOUNTER — Other Ambulatory Visit: Payer: Self-pay

## 2020-10-14 DIAGNOSIS — E785 Hyperlipidemia, unspecified: Secondary | ICD-10-CM

## 2020-10-14 MED ORDER — ROSUVASTATIN CALCIUM 10 MG PO TABS: 10.0000 mg | ORAL_TABLET | Freq: Every day | ORAL | 0 refills | Status: DC

## 2020-10-17 DIAGNOSIS — L570 Actinic keratosis: Secondary | ICD-10-CM | POA: Diagnosis not present

## 2020-10-17 DIAGNOSIS — D225 Melanocytic nevi of trunk: Secondary | ICD-10-CM | POA: Diagnosis not present

## 2020-10-17 DIAGNOSIS — Z1283 Encounter for screening for malignant neoplasm of skin: Secondary | ICD-10-CM | POA: Diagnosis not present

## 2020-10-17 DIAGNOSIS — L820 Inflamed seborrheic keratosis: Secondary | ICD-10-CM | POA: Diagnosis not present

## 2020-10-17 DIAGNOSIS — X32XXXD Exposure to sunlight, subsequent encounter: Secondary | ICD-10-CM | POA: Diagnosis not present

## 2020-10-20 ENCOUNTER — Other Ambulatory Visit: Payer: Self-pay

## 2020-10-20 ENCOUNTER — Telehealth (INDEPENDENT_AMBULATORY_CARE_PROVIDER_SITE_OTHER): Payer: Medicare HMO | Admitting: Registered Nurse

## 2020-10-20 DIAGNOSIS — B9689 Other specified bacterial agents as the cause of diseases classified elsewhere: Secondary | ICD-10-CM

## 2020-10-20 DIAGNOSIS — J019 Acute sinusitis, unspecified: Secondary | ICD-10-CM | POA: Diagnosis not present

## 2020-10-20 MED ORDER — AMOXICILLIN-POT CLAVULANATE 875-125 MG PO TABS: 1.0000 | ORAL_TABLET | Freq: Two times a day (BID) | ORAL | 0 refills | Status: DC

## 2020-10-20 MED ORDER — GUAIFENESIN-CODEINE 100-10 MG/5ML PO SOLN: 5.0000 mL | Freq: Three times a day (TID) | ORAL | 0 refills | Status: DC | PRN

## 2020-10-20 NOTE — Progress Notes (Signed)
Telemedicine Encounter- SOAP NOTE Established Patient  This telephone encounter was conducted with the patient's (or proxy's) verbal consent via audio telecommunications: yes  Patient was instructed to have this encounter in a suitably private space; and to only have persons present to whom they give permission to participate. In addition, patient identity was confirmed by use of name plus two identifiers (DOB and address).  I discussed the limitations, risks, security and privacy concerns of performing an evaluation and management service by telephone and the availability of in person appointments. I also discussed with the patient that there may be a patient responsible charge related to this service. The patient expressed understanding and agreed to proceed.  I spent a total of 15 minutes talking with the patient or their proxy.  Patient at vacation rental home, secure location Provider in office  Participants: Kathrin Ruddy, NP and Anne Hahn  Chief Complaint  Patient presents with  . Sinus Problem    Patient states he believes he has a sinus infection. Patient states he has been having some congestion and and feels like he is drowning in his phlegm that started green but is now clear. He has been taking allegra and a nasal spray with little relief and wants to feel better while is on Vacation.    Subjective   Jerry Simpson is a 75 y.o. established patient. Telephone visit today for sinus congestion  HPI Onset 4-5 days ago after exposure to substantial dust and particulate matter. Was outside mowing when this happened. Usually does okay when he takes OTC allergy meds and fluticasone. However, has had substantial sinus pressure and ample drainage.  Substantial PND with cough, cannot lie down or he feels like he is "drowning in phlegm".  Has had sinus infections in the past, though it has been some time. This feels familiar.  Denies chest pain, wheezing, shob, doe,  palpitations, LOC, lightheadedness, dizziness, nvd, sick contacts.  Currently on [place] for vacation  Patient Active Problem List   Diagnosis Date Noted  . Murmur 01/24/2020  . SOB (shortness of breath) 01/24/2020  . Overweight (BMI 25.0-29.9) 01/17/2020  . Upper airway cough syndrome 06/12/2015  . Physical exam 07/09/2014  . Bladder tumor   . Reactive airway disease that is not asthma 05/13/2014  . Blood donor 07/04/2013  . HYPERGLYCEMIA, FASTING 07/16/2010  . Coronary atherosclerosis 10/16/2008  . BENIGN PROSTATIC HYPERTROPHY 10/16/2008  . SKIN CANCER, HX OF 10/16/2008  . COLONIC POLYPS, HX OF 10/16/2008  . ELEVATED BLOOD PRESSURE WITHOUT DIAGNOSIS OF HYPERTENSION 06/05/2008  . Hyperlipidemia 09/27/2007  . GERD 06/20/2007  . Allergic rhinitis, cause unspecified 05/05/2007    Past Medical History:  Diagnosis Date  . Adenomatous polyps   . Allergy   . Borderline systolic HTN   . BPH (benign prostatic hyperplasia)    elevated PSA, Dr Jeffie Pollock  . CAD (coronary artery disease)   . Cataract    removed both eyes   . Environmental allergies   . GERD (gastroesophageal reflux disease)   . Hyperlipidemia     Current Outpatient Medications  Medication Sig Dispense Refill  . amoxicillin-clavulanate (AUGMENTIN) 875-125 MG tablet Take 1 tablet by mouth 2 (two) times daily. 14 tablet 0  . ASPIRIN 81 PO Take by mouth.    . fenofibrate 160 MG tablet Take 1 tablet (160 mg total) by mouth daily. 90 tablet 1  . fexofenadine (ALLEGRA) 180 MG tablet Take 180 mg by mouth daily as needed for allergies.     Marland Kitchen  finasteride (PROPECIA) 1 MG tablet     . fluticasone (FLONASE) 50 MCG/ACT nasal spray Place 2 sprays into both nostrils daily. 16 g 1  . guaiFENesin-codeine 100-10 MG/5ML syrup Take 5 mLs by mouth 3 (three) times daily as needed for cough. 120 mL 0  . rosuvastatin (CRESTOR) 10 MG tablet Take 1 tablet (10 mg total) by mouth daily. 90 tablet 0  . Coenzyme Q10 (CO Q-10) 100 MG CAPS       No current facility-administered medications for this visit.    Allergies  Allergen Reactions  . Sulfonamide Derivatives Rash    RASH  Because of a history of documented adverse serious drug reaction;Medi Alert bracelet  is recommended  . Claritin [Loratadine]     Pt states it affects his prostate.     Social History   Socioeconomic History  . Marital status: Married    Spouse name: Not on file  . Number of children: Not on file  . Years of education: Not on file  . Highest education level: Not on file  Occupational History  . Not on file  Tobacco Use  . Smoking status: Never Smoker  . Smokeless tobacco: Never Used  Vaping Use  . Vaping Use: Never used  Substance and Sexual Activity  . Alcohol use: Yes    Alcohol/week: 7.0 standard drinks    Types: 7 drink(s) per week    Comment:  < 7/ week  . Drug use: No  . Sexual activity: Not on file  Other Topics Concern  . Not on file  Social History Narrative   Lives with wife.     Social Determinants of Health   Financial Resource Strain: Low Risk   . Difficulty of Paying Living Expenses: Not hard at all  Food Insecurity: No Food Insecurity  . Worried About Charity fundraiser in the Last Year: Never true  . Ran Out of Food in the Last Year: Never true  Transportation Needs: No Transportation Needs  . Lack of Transportation (Medical): No  . Lack of Transportation (Non-Medical): No  Physical Activity: Unknown  . Days of Exercise per Week: Not on file  . Minutes of Exercise per Session: 0 min  Stress: No Stress Concern Present  . Feeling of Stress : Not at all  Social Connections: Moderately Integrated  . Frequency of Communication with Friends and Family: More than three times a week  . Frequency of Social Gatherings with Friends and Family: Once a week  . Attends Religious Services: 1 to 4 times per year  . Active Member of Clubs or Organizations: No  . Attends Archivist Meetings: Never  . Marital  Status: Married  Human resources officer Violence: Not At Risk  . Fear of Current or Ex-Partner: No  . Emotionally Abused: No  . Physically Abused: No  . Sexually Abused: No    Review of Systems  Constitutional: Negative.   HENT: Positive for congestion and sinus pain. Negative for ear discharge, ear pain and sore throat.   Eyes: Negative.   Respiratory: Negative.   Cardiovascular: Negative.   Gastrointestinal: Negative.   Genitourinary: Negative.   Musculoskeletal: Negative.   Skin: Negative.   Neurological: Negative.   Endo/Heme/Allergies: Negative.   Psychiatric/Behavioral: Negative.   All other systems reviewed and are negative.   Objective   Vitals as reported by the patient: There were no vitals filed for this visit.  Danie was seen today for sinus problem.  Diagnoses and all orders for  this visit:  Acute bacterial sinusitis -     amoxicillin-clavulanate (AUGMENTIN) 875-125 MG tablet; Take 1 tablet by mouth 2 (two) times daily. -     guaiFENesin-codeine 100-10 MG/5ML syrup; Take 5 mLs by mouth 3 (three) times daily as needed for cough.   PLAN  Suspect bacterial etiology  Continue OTC meds  Start augmentin po bid for one week  Guaifenesin-codeine syrup sent for nighttime symptom relief  Return to clinic if worsening or failing to improve  Patient encouraged to call clinic with any questions, comments, or concerns.  I discussed the assessment and treatment plan with the patient. The patient was provided an opportunity to ask questions and all were answered. The patient agreed with the plan and demonstrated an understanding of the instructions.   The patient was advised to call back or seek an in-person evaluation if the symptoms worsen or if the condition fails to improve as anticipated.  I provided 15 minutes of non-face-to-face time during this encounter.  Maximiano Coss, NP  Primary Care at [facility]

## 2020-10-20 NOTE — Patient Instructions (Signed)
° ° ° °  If you have lab work done today you will be contacted with your lab results within the next 2 weeks.  If you have not heard from us then please contact us. The fastest way to get your results is to register for My Chart. ° ° °IF you received an x-ray today, you will receive an invoice from [facility]. Please contact [facility] at [phone] with questions or concerns regarding your invoice.  ° °IF you received labwork today, you will receive an invoice from [facility]. Please contact [facility] at [phone] with questions or concerns regarding your invoice.  ° °Our billing staff will not be able to assist you with questions regarding bills from these companies. ° °You will be contacted with the lab results as soon as they are available. The fastest way to get your results is to activate your My Chart account. Instructions are located on the last page of this paperwork. If you have not heard from us regarding the results in 2 weeks, please contact this office. °  ° ° ° °

## 2020-10-22 ENCOUNTER — Encounter: Payer: Self-pay | Admitting: Family Medicine

## 2020-10-23 ENCOUNTER — Other Ambulatory Visit: Payer: Self-pay

## 2020-10-23 ENCOUNTER — Telehealth (INDEPENDENT_AMBULATORY_CARE_PROVIDER_SITE_OTHER): Payer: Medicare HMO | Admitting: Registered Nurse

## 2020-10-23 ENCOUNTER — Other Ambulatory Visit: Payer: Self-pay | Admitting: Registered Nurse

## 2020-10-23 ENCOUNTER — Encounter: Payer: Self-pay | Admitting: Registered Nurse

## 2020-10-23 DIAGNOSIS — R059 Cough, unspecified: Secondary | ICD-10-CM

## 2020-10-23 MED ORDER — PREDNISONE 10 MG (21) PO TBPK: ORAL_TABLET | ORAL | 0 refills | Status: DC

## 2020-10-23 MED ORDER — DEXTROMETHORPHAN HBR 15 MG/5ML PO SYRP: 10.0000 mL | ORAL_SOLUTION | Freq: Four times a day (QID) | ORAL | 0 refills | Status: DC | PRN

## 2020-10-23 MED ORDER — HYDROCODONE BIT-HOMATROP MBR 5-1.5 MG/5ML PO SOLN: 5.0000 mL | Freq: Three times a day (TID) | ORAL | 0 refills | Status: DC | PRN

## 2020-10-23 MED ORDER — BENZONATATE 100 MG PO CAPS: 100.0000 mg | ORAL_CAPSULE | Freq: Two times a day (BID) | ORAL | 0 refills | Status: DC | PRN

## 2020-10-23 NOTE — Patient Instructions (Signed)
° ° ° °  If you have lab work done today you will be contacted with your lab results within the next 2 weeks.  If you have not heard from us then please contact us. The fastest way to get your results is to register for My Chart. ° ° °IF you received an x-ray today, you will receive an invoice from [facility]. Please contact [facility] at [phone] with questions or concerns regarding your invoice.  ° °IF you received labwork today, you will receive an invoice from [facility]. Please contact [facility] at [phone] with questions or concerns regarding your invoice.  ° °Our billing staff will not be able to assist you with questions regarding bills from these companies. ° °You will be contacted with the lab results as soon as they are available. The fastest way to get your results is to activate your My Chart account. Instructions are located on the last page of this paperwork. If you have not heard from us regarding the results in 2 weeks, please contact this office. °  ° ° ° °

## 2020-10-23 NOTE — Progress Notes (Signed)
Telemedicine Encounter- SOAP NOTE Established Patient  This telephone encounter was conducted with the patient's (or proxy's) verbal consent via audio telecommunications: yes  Patient was instructed to have this encounter in a suitably private space; and to only have persons present to whom they give permission to participate. In addition, patient identity was confirmed by use of name plus two identifiers (DOB and address).  I discussed the limitations, risks, security and privacy concerns of performing an evaluation and management service by telephone and the availability of in person appointments. I also discussed with the patient that there may be a patient responsible charge related to this service. The patient expressed understanding and agreed to proceed.  I spent a total of 15 minutes talking with the patient or their proxy.  Patient parked in car, safe. Provider in office  Participants: Kathrin Ruddy, NP and  Chief Complaint  Patient presents with  . Follow-up    Patient state he is follow ing up from sinus infection on 10/20/2020. Patient states he is feeling better but symptoms not totally clear.    Subjective   Jerry Simpson is a 75 y.o. established patient. Telephone visit today for  HPI Ongoing symptoms Much improved so far with augmentin but cough still persistent Robitussin and codeine not helping Interested in alternatives No new symptoms or further concerns  Patient Active Problem List   Diagnosis Date Noted  . Murmur 01/24/2020  . SOB (shortness of breath) 01/24/2020  . Overweight (BMI 25.0-29.9) 01/17/2020  . Upper airway cough syndrome 06/12/2015  . Physical exam 07/09/2014  . Bladder tumor   . Reactive airway disease that is not asthma 05/13/2014  . Blood donor 07/04/2013  . HYPERGLYCEMIA, FASTING 07/16/2010  . Coronary atherosclerosis 10/16/2008  . BENIGN PROSTATIC HYPERTROPHY 10/16/2008  . SKIN CANCER, HX OF 10/16/2008  . COLONIC POLYPS, HX OF  10/16/2008  . ELEVATED BLOOD PRESSURE WITHOUT DIAGNOSIS OF HYPERTENSION 06/05/2008  . Hyperlipidemia 09/27/2007  . GERD 06/20/2007  . Allergic rhinitis, cause unspecified 05/05/2007    Past Medical History:  Diagnosis Date  . Adenomatous polyps   . Allergy   . Borderline systolic HTN   . BPH (benign prostatic hyperplasia)    elevated PSA, Dr Jeffie Pollock  . CAD (coronary artery disease)   . Cataract    removed both eyes   . Environmental allergies   . GERD (gastroesophageal reflux disease)   . Hyperlipidemia     Current Outpatient Medications  Medication Sig Dispense Refill  . amoxicillin-clavulanate (AUGMENTIN) 875-125 MG tablet Take 1 tablet by mouth 2 (two) times daily. 14 tablet 0  . ASPIRIN 81 PO Take by mouth.    . Coenzyme Q10 (CO Q-10) 100 MG CAPS     . fenofibrate 160 MG tablet Take 1 tablet (160 mg total) by mouth daily. 90 tablet 1  . fexofenadine (ALLEGRA) 180 MG tablet Take 180 mg by mouth daily as needed for allergies.     . finasteride (PROPECIA) 1 MG tablet     . fluticasone (FLONASE) 50 MCG/ACT nasal spray Place 2 sprays into both nostrils daily. 16 g 1  . guaiFENesin-codeine 100-10 MG/5ML syrup Take 5 mLs by mouth 3 (three) times daily as needed for cough. 120 mL 0  . rosuvastatin (CRESTOR) 10 MG tablet Take 1 tablet (10 mg total) by mouth daily. 90 tablet 0   No current facility-administered medications for this visit.    Allergies  Allergen Reactions  . Sulfonamide Derivatives Rash  RASH  Because of a history of documented adverse serious drug reaction;Medi Alert bracelet  is recommended  . Claritin [Loratadine]     Pt states it affects his prostate.     Social History   Socioeconomic History  . Marital status: Married    Spouse name: Not on file  . Number of children: Not on file  . Years of education: Not on file  . Highest education level: Not on file  Occupational History  . Not on file  Tobacco Use  . Smoking status: Never Smoker  .  Smokeless tobacco: Never Used  Vaping Use  . Vaping Use: Never used  Substance and Sexual Activity  . Alcohol use: Yes    Alcohol/week: 7.0 standard drinks    Types: 7 drink(s) per week    Comment:  < 7/ week  . Drug use: No  . Sexual activity: Not on file  Other Topics Concern  . Not on file  Social History Narrative   Lives with wife.     Social Determinants of Health   Financial Resource Strain: Low Risk   . Difficulty of Paying Living Expenses: Not hard at all  Food Insecurity: No Food Insecurity  . Worried About Charity fundraiser in the Last Year: Never true  . Ran Out of Food in the Last Year: Never true  Transportation Needs: No Transportation Needs  . Lack of Transportation (Medical): No  . Lack of Transportation (Non-Medical): No  Physical Activity: Unknown  . Days of Exercise per Week: Not on file  . Minutes of Exercise per Session: 0 min  Stress: No Stress Concern Present  . Feeling of Stress : Not at all  Social Connections: Moderately Integrated  . Frequency of Communication with Friends and Family: More than three times a week  . Frequency of Social Gatherings with Friends and Family: Once a week  . Attends Religious Services: 1 to 4 times per year  . Active Member of Clubs or Organizations: No  . Attends Archivist Meetings: Never  . Marital Status: Married  Human resources officer Violence: Not At Risk  . Fear of Current or Ex-Partner: No  . Emotionally Abused: No  . Physically Abused: No  . Sexually Abused: No    ROS Per hpi   Objective   Vitals as reported by the patient: There were no vitals filed for this visit.  There are no diagnoses linked to this encounter.  PLAN  Prednisone dose pack  Alternative cough therapies as above  Return if symptoms worsen or fail to improve  Patient encouraged to call clinic with any questions, comments, or concerns.   I discussed the assessment and treatment plan with the patient. The patient was  provided an opportunity to ask questions and all were answered. The patient agreed with the plan and demonstrated an understanding of the instructions.   The patient was advised to call back or seek an in-person evaluation if the symptoms worsen or if the condition fails to improve as anticipated.  I provided 15 minutes of non-face-to-face time during this encounter.  Maximiano Coss, NP  [facility]

## 2020-10-23 NOTE — Telephone Encounter (Signed)
This is replacing the codeine yes- if we could let the pharmacy know  Thank you  Denice Paradise

## 2020-10-23 NOTE — Telephone Encounter (Signed)
Yes - calling this as alternative since codeine ineffective.  Thank you  Rich

## 2020-10-24 MED ORDER — PANTOPRAZOLE SODIUM 40 MG PO TBEC: 1.0000 | DELAYED_RELEASE_TABLET | Freq: Every day | ORAL | 1 refills | Status: DC

## 2020-11-28 DIAGNOSIS — Z1152 Encounter for screening for COVID-19: Secondary | ICD-10-CM | POA: Diagnosis not present

## 2020-12-10 DIAGNOSIS — Z1159 Encounter for screening for other viral diseases: Secondary | ICD-10-CM | POA: Diagnosis not present

## 2020-12-10 DIAGNOSIS — Z8616 Personal history of COVID-19: Secondary | ICD-10-CM | POA: Diagnosis not present

## 2020-12-10 DIAGNOSIS — R0602 Shortness of breath: Secondary | ICD-10-CM | POA: Diagnosis not present

## 2020-12-17 ENCOUNTER — Encounter: Payer: Self-pay | Admitting: *Deleted

## 2021-01-02 ENCOUNTER — Telehealth: Payer: Medicare HMO

## 2021-01-23 ENCOUNTER — Other Ambulatory Visit: Payer: Self-pay | Admitting: Family Medicine

## 2021-01-23 DIAGNOSIS — E785 Hyperlipidemia, unspecified: Secondary | ICD-10-CM

## 2021-01-28 DIAGNOSIS — H35372 Puckering of macula, left eye: Secondary | ICD-10-CM | POA: Diagnosis not present

## 2021-01-28 DIAGNOSIS — H524 Presbyopia: Secondary | ICD-10-CM | POA: Diagnosis not present

## 2021-01-28 DIAGNOSIS — H26493 Other secondary cataract, bilateral: Secondary | ICD-10-CM | POA: Diagnosis not present

## 2021-01-28 DIAGNOSIS — H43813 Vitreous degeneration, bilateral: Secondary | ICD-10-CM | POA: Diagnosis not present

## 2021-02-10 DIAGNOSIS — H26492 Other secondary cataract, left eye: Secondary | ICD-10-CM | POA: Diagnosis not present

## 2021-02-25 ENCOUNTER — Telehealth: Payer: Self-pay

## 2021-02-25 NOTE — Progress Notes (Signed)
Chronic Care Management Pharmacy Assistant   Name: Jerry Simpson  MRN: [id number] DOB: 27-May-1946   Reason for Encounter: General Adherence Call     Recent office visits:  10/23/20 Jerry Coss NP, Telemedicine Visit,  Started Benzonatate '100mg'$  2 times daily PRN, Started Dextromethorphan 30 mg 4 times a day, Started Hydrocodone Bit-Homatrop 5-1.5 MG/ML every 8 hours PRN, Started Prednisone 10 MG. No follow ups. 10/20/20 Jerry Coss NP Video Visit, Started Amoxicillin [phone] MG Take 1 tablet by mouth 2 (two) times daily. Started guaiFENesin-codeine 100-10 MG/5ML syrup 5 mLs by mouth 3 (three) times daily as needed for cough. Follow up in 2 weeks.  07/28/20 Jerry Hamman LPN,  No med changes, follow up on 07/28/21 07/24/20 Jerry Asa MD Started Fenofibrate '160mg'$  daily. Follow up in 6 months.  Recent consult visits:  10/17/20 Jerry Simpson No notes available  08/29/20 [facility], No notes available  08/12/20 [facility] No notes available   Hospital visits:  None in previous 6 months  Medications: Outpatient Encounter Medications as of 02/25/2021  Medication Sig Note   amoxicillin-clavulanate (AUGMENTIN) 875-125 MG tablet Take 1 tablet by mouth 2 (two) times daily.    ASPIRIN 81 PO Take by mouth.    benzonatate (TESSALON) 100 MG capsule Take 1 capsule (100 mg total) by mouth 2 (two) times daily as needed for cough.    Coenzyme Q10 (CO Q-10) 100 MG CAPS     dextromethorphan 15 MG/5ML syrup Take 10 mLs (30 mg total) by mouth 4 (four) times daily as needed for cough.    fenofibrate 160 MG tablet Take 1 tablet (160 mg total) by mouth daily.    fexofenadine (ALLEGRA) 180 MG tablet Take 180 mg by mouth daily as needed for allergies.     finasteride (PROPECIA) 1 MG tablet  04/02/2020: Pt takes 1/4   fluticasone (FLONASE) 50 MCG/ACT nasal spray Place 2 sprays into both nostrils daily.    guaiFENesin-codeine 100-10 MG/5ML syrup Take 5 mLs by  mouth 3 (three) times daily as needed for cough.    HYDROcodone bit-homatropine (HYCODAN) 5-1.5 MG/5ML syrup TAKE 5 ML BY MOUTH  EVERY 8 HOURS AS NEEDED FOR COUGH    pantoprazole (PROTONIX) 40 MG tablet Take 1 tablet (40 mg total) by mouth daily.    predniSONE (STERAPRED UNI-PAK 21 TAB) 10 MG (21) TBPK tablet Take per package instructions. Do not skip doses. Finish entire supply.    rosuvastatin (CRESTOR) 10 MG tablet TAKE ONE TABLET BY MOUTH DAILY    No facility-administered encounter medications on file as of 02/25/2021.    Patient Questions:   Have you had any problems recently with your health? Pt states not having any problems  Have you had any problems with your pharmacy? Pt states no issues   What issues or side effects are you having with your medications? Pt states no side effects to any medications   What would you like me to pass along to [name], CPP for him to help you with?  Pt states he has nothing to pass along  What can we do to take care of you better? Pt is doing well and has nothing else to add    Care Gaps: Annual Wellness: Done on 07/24/20 Zoster Vaccines- Shingrix: Overdue COVID-19 Vaccination: Overdue since 01/31/20 PPSV23 Vaccination: Completed Influenza Vaccination: Overdue since 01/19/21 Hepatitis C: Postponed until 07/24/21 Colonoscopy: Next due on 10/12/22 Tetanus/DTAP: Next due on 06/30/22 HPV Vaccines: Aged Out  Star Rating Drugs: Rosuvastatin 10 mg last filled on 01/23/21 90ds   Danielle Gerringer CMA  Health Concierge    Time Spent: 36 minutes

## 2021-03-13 ENCOUNTER — Telehealth: Payer: Medicare HMO

## 2021-03-13 ENCOUNTER — Telehealth: Payer: Medicare HMO | Admitting: Family

## 2021-03-13 DIAGNOSIS — J0100 Acute maxillary sinusitis, unspecified: Secondary | ICD-10-CM

## 2021-03-13 MED ORDER — AMOXICILLIN-POT CLAVULANATE 875-125 MG PO TABS: 1.0000 | ORAL_TABLET | Freq: Two times a day (BID) | ORAL | 0 refills | Status: DC

## 2021-03-13 NOTE — Progress Notes (Signed)
Virtual Visit Consent   [name], you are scheduled for a virtual visit with a [facility] provider today.     Just as with appointments in the office, your consent must be obtained to participate.  Your consent will be active for this visit and any virtual visit you may have with one of our providers in the next 365 days.     If you have a MyChart account, a copy of this consent can be sent to you electronically.  All virtual visits are billed to your insurance company just like a traditional visit in the office.    As this is a virtual visit, video technology does not allow for your provider to perform a traditional examination.  This may limit your provider's ability to fully assess your condition.  If your provider identifies any concerns that need to be evaluated in person or the need to arrange testing (such as labs, EKG, etc.), we will make arrangements to do so.     Although advances in technology are sophisticated, we cannot ensure that it will always work on either your end or our end.  If the connection with a video visit is poor, the visit may have to be switched to a telephone visit.  With either a video or telephone visit, we are not always able to ensure that we have a secure connection.     I need to obtain your verbal consent now.   Are you willing to proceed with your visit today?    Jerry Simpson has provided verbal consent on 03/13/2021 for a virtual visit (video or telephone).   Jerry Dun, FNP   Date: 03/13/2021 9:25 AM   Virtual Visit via Video Note   I, Jerry Simpson, connected with  Jerry Simpson  ([id number], 23-Jul-1945) on 03/13/21 at  9:00 AM EDT by a video-enabled telemedicine application and verified that I am speaking with the correct person using two identifiers.  Location: Patient: Virtual Visit Location Patient: Home Provider: Virtual Visit Location Provider: Home   I discussed the limitations of evaluation and management by telemedicine  and the availability of in person appointments. The patient expressed understanding and agreed to proceed.    History of Present Illness: Jerry Simpson is a 75 y.o. who identifies as a male who was assigned male at birth, and is being seen today for sinus issues that started over a week. States it started in right lower gum pain that radiates to his ear and maxillary pain.   HPI: Sinusitis This is a new problem. The current episode started 1 to 4 weeks ago. The problem has been waxing and waning since onset. There has been no fever. His pain is at a severity of 3/10. Associated symptoms include congestion, ear pain, headaches, sinus pressure and sneezing. Pertinent negatives include no chills, coughing, shortness of breath, sore throat or swollen glands. Past treatments include acetaminophen. The treatment provided mild relief.   Problems:  Patient Active Problem List   Diagnosis Date Noted   Murmur 01/24/2020   SOB (shortness of breath) 01/24/2020   Overweight (BMI 25.0-29.9) 01/17/2020   Upper airway cough syndrome 06/12/2015   Physical exam 07/09/2014   Bladder tumor    Reactive airway disease that is not asthma 05/13/2014   Blood donor 07/04/2013   HYPERGLYCEMIA, FASTING 07/16/2010   Coronary atherosclerosis 10/16/2008   BENIGN PROSTATIC HYPERTROPHY 10/16/2008   SKIN CANCER, HX OF 10/16/2008   COLONIC POLYPS, HX OF 10/16/2008  ELEVATED BLOOD PRESSURE WITHOUT DIAGNOSIS OF HYPERTENSION 06/05/2008   Hyperlipidemia 09/27/2007   GERD 06/20/2007   Allergic rhinitis, cause unspecified 05/05/2007    Allergies:  Allergies  Allergen Reactions   Sulfonamide Derivatives Rash    RASH  Because of a history of documented adverse serious drug reaction;Medi Alert bracelet  is recommended   Claritin [Loratadine]     Pt states it affects his prostate.    Medications:  Current Outpatient Medications:    amoxicillin-clavulanate (AUGMENTIN) 875-125 MG tablet, Take 1 tablet by mouth 2 (two)  times daily., Disp: 14 tablet, Rfl: 0   ASPIRIN 81 PO, Take by mouth., Disp: , Rfl:    Coenzyme Q10 (CO Q-10) 100 MG CAPS, , Disp: , Rfl:    fenofibrate 160 MG tablet, Take 1 tablet (160 mg total) by mouth daily., Disp: 90 tablet, Rfl: 1   fexofenadine (ALLEGRA) 180 MG tablet, Take 180 mg by mouth daily as needed for allergies. , Disp: , Rfl:    finasteride (PROPECIA) 1 MG tablet, , Disp: , Rfl:    fluticasone (FLONASE) 50 MCG/ACT nasal spray, Place 2 sprays into both nostrils daily., Disp: 16 g, Rfl: 1   pantoprazole (PROTONIX) 40 MG tablet, Take 1 tablet (40 mg total) by mouth daily., Disp: 100 tablet, Rfl: 1   rosuvastatin (CRESTOR) 10 MG tablet, TAKE ONE TABLET BY MOUTH DAILY, Disp: 90 tablet, Rfl: 0  Observations/Objective: Patient is well-developed, well-nourished in no acute distress.  Resting comfortably  at home.  Head is normocephalic, atraumatic.  No labored breathing. Speech is clear and coherent with logical content.  Patient is alert and oriented at baseline.    Assessment and Plan: 1. Acute maxillary sinusitis, recurrence not specified - amoxicillin-clavulanate (AUGMENTIN) 875-125 MG tablet; Take 1 tablet by mouth 2 (two) times daily.  Dispense: 14 tablet; Refill: 0 I will given a prescription of Augmentin, he will hold off on taking to see if symptoms  improve over next 1-2 days. If symptoms worsen he will start medication.  - Use a cool mist humidifier  -Use saline nose sprays frequently -Force fluids -For any cough or congestion  Use plain Mucinex- regular strength or max strength is fine -For fever or aces or pains- take tylenol or ibuprofen. -Throat lozenges if help   Follow Up Instructions: I discussed the assessment and treatment plan with the patient. The patient was provided an opportunity to ask questions and all were answered. The patient agreed with the plan and demonstrated an understanding of the instructions.  A copy of instructions were sent to the  patient via MyChart unless otherwise noted below.   Patient has requested to receive PHI (AVS, Work Notes, etc) pertaining to this video visit through e-mail as they are currently without active [place]. They have voiced understand that email is not considered secure and their health information could be viewed by someone other than the patient.   The patient was advised to call back or seek an in-person evaluation if the symptoms worsen or if the condition fails to improve as anticipated.  Time:  I spent 18 minutes with the patient via telehealth technology discussing the above problems/concerns.    Jerry Dun, FNP

## 2021-04-06 ENCOUNTER — Ambulatory Visit (INDEPENDENT_AMBULATORY_CARE_PROVIDER_SITE_OTHER): Payer: Medicare HMO | Admitting: Family Medicine

## 2021-04-06 ENCOUNTER — Other Ambulatory Visit: Payer: Self-pay

## 2021-04-06 DIAGNOSIS — Z23 Encounter for immunization: Secondary | ICD-10-CM

## 2021-04-07 DIAGNOSIS — H26491 Other secondary cataract, right eye: Secondary | ICD-10-CM | POA: Diagnosis not present

## 2021-05-06 ENCOUNTER — Telehealth: Payer: Self-pay

## 2021-05-06 NOTE — Chronic Care Management (AMB) (Signed)
    Chronic Care Management Pharmacy Assistant   Name: Jerry Simpson  MRN: [id number] DOB: Feb 13, 1946   Reason for Encounter: CMA follow up call / GAP adherence    Called and LM for patient to follow up and see if patient checks blood pressures at home and if there are any updated home readings to update on GAP adherence report.  Jobe Gibbon, [facility] Pharmacist Assistant  [phone]  Time Spent:  6 minutes

## 2021-05-08 DIAGNOSIS — X32XXXD Exposure to sunlight, subsequent encounter: Secondary | ICD-10-CM | POA: Diagnosis not present

## 2021-05-08 DIAGNOSIS — L820 Inflamed seborrheic keratosis: Secondary | ICD-10-CM | POA: Diagnosis not present

## 2021-05-08 DIAGNOSIS — L570 Actinic keratosis: Secondary | ICD-10-CM | POA: Diagnosis not present

## 2021-06-05 ENCOUNTER — Other Ambulatory Visit: Payer: Self-pay

## 2021-06-05 DIAGNOSIS — E785 Hyperlipidemia, unspecified: Secondary | ICD-10-CM

## 2021-06-05 MED ORDER — ROSUVASTATIN CALCIUM 10 MG PO TABS: 10.0000 mg | ORAL_TABLET | Freq: Every day | ORAL | 0 refills | Status: DC

## 2021-06-19 DIAGNOSIS — H52223 Regular astigmatism, bilateral: Secondary | ICD-10-CM | POA: Diagnosis not present

## 2021-06-19 DIAGNOSIS — H524 Presbyopia: Secondary | ICD-10-CM | POA: Diagnosis not present

## 2021-07-07 ENCOUNTER — Telehealth: Payer: Self-pay

## 2021-07-07 DIAGNOSIS — E785 Hyperlipidemia, unspecified: Secondary | ICD-10-CM

## 2021-07-07 NOTE — Telephone Encounter (Signed)
Caller name:Zaccai Kammerer   On DPR? :Yes  Call back number:[phone]  Provider they see: Birdie Riddle   Reason for call:Pt wants blood work ordered early before phy

## 2021-07-08 NOTE — Telephone Encounter (Signed)
Labs ordered.  They can go to [facility] at their convenience

## 2021-07-23 ENCOUNTER — Encounter: Payer: Self-pay | Admitting: Family Medicine

## 2021-07-24 ENCOUNTER — Other Ambulatory Visit: Payer: Self-pay | Admitting: Family Medicine

## 2021-07-24 DIAGNOSIS — Z125 Encounter for screening for malignant neoplasm of prostate: Secondary | ICD-10-CM

## 2021-07-24 NOTE — Telephone Encounter (Signed)
Pt reports flank pain as well as cloudy urine, is there anything further to advise or ok to direct to leave sample and order with flank pain dx?

## 2021-07-27 ENCOUNTER — Encounter: Payer: Medicare HMO | Admitting: Family Medicine

## 2021-08-03 ENCOUNTER — Ambulatory Visit: Payer: Medicare HMO

## 2021-08-03 ENCOUNTER — Other Ambulatory Visit (INDEPENDENT_AMBULATORY_CARE_PROVIDER_SITE_OTHER): Payer: Medicare HMO

## 2021-08-03 DIAGNOSIS — E785 Hyperlipidemia, unspecified: Secondary | ICD-10-CM

## 2021-08-03 DIAGNOSIS — Z125 Encounter for screening for malignant neoplasm of prostate: Secondary | ICD-10-CM | POA: Diagnosis not present

## 2021-08-03 LAB — CBC WITH DIFFERENTIAL/PLATELET
Basophils Absolute: 0.1 10*3/uL (ref 0.0–0.1)
Basophils Relative: 1.2 % (ref 0.0–3.0)
Eosinophils Absolute: 0.1 10*3/uL (ref 0.0–0.7)
Eosinophils Relative: 1.9 % (ref 0.0–5.0)
HCT: 39.3 % (ref 39.0–52.0)
Hemoglobin: 13.2 g/dL (ref 13.0–17.0)
Lymphocytes Relative: 26.5 % (ref 12.0–46.0)
Lymphs Abs: 1.7 10*3/uL (ref 0.7–4.0)
MCHC: 33.5 g/dL (ref 30.0–36.0)
MCV: 86.1 fl (ref 78.0–100.0)
Monocytes Absolute: 0.5 10*3/uL (ref 0.1–1.0)
Monocytes Relative: 7.3 % (ref 3.0–12.0)
Neutro Abs: 4.1 10*3/uL (ref 1.4–7.7)
Neutrophils Relative %: 63.1 % (ref 43.0–77.0)
Platelets: 227 10*3/uL (ref 150.0–400.0)
RBC: 4.57 Mil/uL (ref 4.22–5.81)
RDW: 14 % (ref 11.5–15.5)
WBC: 6.4 10*3/uL (ref 4.0–10.5)

## 2021-08-03 LAB — TSH: TSH: 2.49 u[IU]/mL (ref 0.35–5.50)

## 2021-08-03 LAB — PSA, MEDICARE: PSA: 1.26 ng/ml (ref 0.10–4.00)

## 2021-08-04 LAB — BASIC METABOLIC PANEL
BUN: 15 mg/dL (ref 6–23)
CO2: 33 mEq/L — ABNORMAL HIGH (ref 19–32)
Calcium: 9.6 mg/dL (ref 8.4–10.5)
Chloride: 103 mEq/L (ref 96–112)
Creatinine, Ser: 0.97 mg/dL (ref 0.40–1.50)
GFR: 77.36 mL/min (ref >60.00)
Glucose, Bld: 98 mg/dL (ref 70–99)
Potassium: 4.5 mEq/L (ref 3.5–5.1)
Sodium: 140 mEq/L (ref 135–145)

## 2021-08-04 LAB — HEPATIC FUNCTION PANEL
ALT: 17 U/L (ref 0–53)
AST: 21 U/L (ref 0–37)
Albumin: 4.6 g/dL (ref 3.5–5.2)
Alkaline Phosphatase: 55 U/L (ref 39–117)
Bilirubin, Direct: 0.1 mg/dL (ref 0.0–0.3)
Total Bilirubin: 0.5 mg/dL (ref 0.2–1.2)
Total Protein: 7.1 g/dL (ref 6.0–8.3)

## 2021-08-04 LAB — LIPID PANEL
Cholesterol: 133 mg/dL (ref 0–200)
HDL: 33.9 mg/dL — ABNORMAL LOW (ref >39.00)
LDL Cholesterol: 63 mg/dL (ref 0–99)
NonHDL: 98.77
Total CHOL/HDL Ratio: 4
Triglycerides: 177 mg/dL — ABNORMAL HIGH (ref 0.0–149.0)
VLDL: 35.4 mg/dL (ref 0.0–40.0)

## 2021-08-05 ENCOUNTER — Encounter: Payer: Self-pay | Admitting: Family Medicine

## 2021-08-05 ENCOUNTER — Ambulatory Visit (INDEPENDENT_AMBULATORY_CARE_PROVIDER_SITE_OTHER): Payer: Medicare HMO | Admitting: Family Medicine

## 2021-08-05 VITALS — BP 134/80 | HR 73 | Temp 98.3°F | Resp 16 | Ht 74.0 in | Wt 209.4 lb

## 2021-08-05 DIAGNOSIS — Z0000 Encounter for general adult medical examination without abnormal findings: Secondary | ICD-10-CM | POA: Diagnosis not present

## 2021-08-05 NOTE — Assessment & Plan Note (Signed)
Pt's PE WNL.  UTD on colonoscopy, immunizations.  Reviewed labs w/ pt.  All look good.  No changes at this time.  Anticipatory guidance provided.

## 2021-08-05 NOTE — Progress Notes (Signed)
° °  Subjective:    Patient ID: Jerry Simpson, male    DOB: 1946/05/23, 76 y.o.   MRN: [id number]  HPI CPE- UTD on colonoscopy, PNA, flu, Tdap.  Patient Care Team    Relationship Specialty Notifications Start End  Midge Minium, MD PCP - General Family Medicine  04/18/14    Comment: Nestor Ramp, Docia Chuck, MD Consulting Physician Gastroenterology  07/09/14   Beryle Lathe, MD Referring Physician General Surgery  08/18/15    Comment: Urology  [facility] [place], MD Attending Physician   12/29/17    Comment: Vernona Rieger, Jenny Reichmann, MD  Dermatology  12/29/17   [name], [facility] Pharmacist Pharmacist  10/12/19    Comment: PHONE NUMBER [phone]    Health Maintenance  Topic Date Due   Hepatitis C Screening  Never done   Zoster Vaccines- Shingrix (1 of 2) Never done   COVID-19 Vaccine (3 - Booster for Moderna series) 10/26/2019   TETANUS/TDAP  06/30/2022   COLONOSCOPY (Pts 45-73yrs Insurance coverage will need to be confirmed)  10/12/2022   Pneumonia Vaccine 25+ Years old  Completed   INFLUENZA VACCINE  Completed   HPV VACCINES  Aged Out      Review of Systems Patient reports no vision/hearing changes, anorexia, fever ,adenopathy, persistant/recurrent hoarseness, swallowing issues, chest pain, palpitations, edema, persistant/recurrent cough, hemoptysis, dyspnea (rest,exertional, paroxysmal nocturnal), gastrointestinal  bleeding (melena, rectal bleeding), abdominal pain, excessive heart burn, GU symptoms (dysuria, hematuria, voiding/incontinence issues) syncope, focal weakness, memory loss, numbness & tingling, skin/hair/nail changes, depression, anxiety, abnormal bruising/bleeding, musculoskeletal symptoms/signs.   This visit occurred during the SARS-CoV-2 public health emergency.  Safety protocols were in place, including screening questions prior to the visit, additional usage of staff PPE, and extensive cleaning of exam room while observing appropriate contact time as  indicated for disinfecting solutions.      Objective:   Physical Exam General Appearance:    Alert, cooperative, no distress, appears stated age  Head:    Normocephalic, without obvious abnormality, atraumatic  Eyes:    PERRL, conjunctiva/corneas clear, EOM's intact, fundi    benign, both eyes       Ears:    Normal TM's and external ear canals, both ears  Nose:   Deferred due to COVID  Throat:   Neck:   Supple, symmetrical, trachea midline, no adenopathy;       thyroid:  No enlargement/tenderness/nodules  Back:     Symmetric, no curvature, ROM normal, no CVA tenderness  Lungs:     Clear to auscultation bilaterally, respirations unlabored  Chest wall:    No tenderness or deformity  Heart:    Regular rate and rhythm, S1 and S2 normal, no murmur, rub   or gallop  Abdomen:     Soft, non-tender, bowel sounds active all four quadrants,    no masses, no organomegaly  Genitalia:    Deferred to urology  Rectal:    Extremities:   Extremities normal, atraumatic, no cyanosis or edema  Pulses:   2+ and symmetric all extremities  Skin:   Skin color, texture, turgor normal, no rashes or lesions  Lymph nodes:   Cervical, supraclavicular, and axillary nodes normal  Neurologic:   CNII-XII intact. Normal strength, sensation and reflexes      throughout          Assessment & Plan:

## 2021-08-05 NOTE — Patient Instructions (Signed)
Follow up in 6 months to recheck cholesterol Labs look great!  Keep up the good work!! Keep up the good work on healthy diet and regular exercise- you look great! Call with any questions or concerns Stay Safe!  Stay Healthy! Happy Spring!!!

## 2021-08-06 ENCOUNTER — Ambulatory Visit: Payer: Medicare HMO

## 2021-08-19 NOTE — Progress Notes (Signed)
?  ?Cardiology Office Note ? ? ?Date:  08/20/2021  ? ?ID:  Jerry Simpson, DOB Apr 14, 1946, MRN [id number] ? ?PCP:  Jerry Minium, MD  ?Cardiologist:   None ?Referring:  Jerry Minium, MD ? ?Chief Complaint  ?Patient presents with  ? Coronary Artery Disease  ? ? ?  ?History of Present Illness: ?Jerry Simpson is a 76 y.o. male who presents for follow up of CAD.  He saw Jerry. Ron Simpson years ago.  He has had stenting with a Taxus stent in 2004 in the circumflex.  He had a negative stress echo in 2011.   He had a negative POET (Plain Old Exercise Treadmill) in 2021.   ? ?Since I last saw him he has had no new cardiovascular complaints. The patient denies any new symptoms such as chest discomfort, neck or arm discomfort. There has been no new shortness of breath, PND or orthopnea. There have been no reported palpitations, presyncope or syncope.  ? ? ?Past Medical History:  ?Diagnosis Date  ? Adenomatous polyps   ? Allergy   ? Borderline systolic HTN   ? BPH (benign prostatic hyperplasia)   ? elevated PSA, Jerry Jerry Simpson  ? CAD (coronary artery disease)   ? Cataract   ? removed both eyes   ? Environmental allergies   ? GERD (gastroesophageal reflux disease)   ? Hyperlipidemia   ? ? ?Past Surgical History:  ?Procedure Laterality Date  ? ANGIOPLASTY  2004  ? stent placement ;sees Jerry Simpson  ? CATARACT EXTRACTION, BILATERAL  2012  ? COLONOSCOPY    ? colonoscopy with polypectomy    ? X2, Jerry Jerry Simpson  ? POLYPECTOMY    ? prostatic artery embolization    ? TONSILLECTOMY  1962  ? ? ? ?Current Outpatient Medications  ?Medication Sig Dispense Refill  ? ASPIRIN 81 PO Take by mouth.    ? Coenzyme Q10 (CO Q-10) 100 MG CAPS     ? COVID-19 At Home Test (COVID-19 OTC ANTIGEN 1-PACK VI) by In Vitro route.    ? fexofenadine (ALLEGRA) 180 MG tablet Take 180 mg by mouth daily as needed for allergies.     ? finasteride (PROPECIA) 1 MG tablet     ? fluticasone (FLONASE) 50 MCG/ACT nasal spray Place 2 sprays into both nostrils daily. 16 g 1  ?  pantoprazole (PROTONIX) 40 MG tablet Take 1 tablet (40 mg total) by mouth daily. 100 tablet 1  ? rosuvastatin (CRESTOR) 10 MG tablet Take 1 tablet (10 mg total) by mouth daily. 90 tablet 0  ? ?No current facility-administered medications for this visit.  ? ? ?Allergies:   Sulfonamide derivatives and Claritin [loratadine]  ? ? ?ROS:  Please see the history of present illness.   Otherwise, review of systems are positive for none.   All other systems are reviewed and negative.  ? ? ?PHYSICAL EXAM: ?VS:  BP (!) 160/82   Pulse 85   Ht 6\' 2"  (1.88 m)   Wt 206 lb 9.6 oz (93.7 kg)   SpO2 99%   BMI 26.53 kg/m?  , BMI Body mass index is 26.53 kg/m?. ?GENERAL:  Well appearing ?NECK:  No jugular venous distention, waveform within normal limits, carotid upstroke brisk and symmetric, no bruits, no thyromegaly ?LUNGS:  Clear to auscultation bilaterally ?CHEST:  Unremarkable ?HEART:  PMI not displaced or sustained,S1 and S2 within normal limits, no S3, no S4, no clicks, no rubs, brief apical short systolic murmur only at the apex and nonradiating,  no diastolic murmurs ?ABD:  Flat, positive bowel sounds normal in frequency in pitch, no bruits, no rebound, no guarding, no midline pulsatile mass, no hepatomegaly, no splenomegaly ?EXT:  2 plus pulses throughout, no edema, no cyanosis no clubbing ? ? ?EKG:  EKG is  ordered today. ?The ekg ordered today demonstrates sinus rhythm, rate 85, axis within normal limits, intervals within normal limits, no acute ST-T wave changes. ? ? ?Recent Labs: ?08/03/2021: ALT 17; BUN 15; Creatinine, Ser 0.97; Hemoglobin 13.2; Platelets 227.0; Potassium 4.5; Sodium 140; TSH 2.49  ? ? ?Lipid Panel ?   ?Component Value Date/Time  ? CHOL 133 08/03/2021 1134  ? CHOL 150 07/23/2020 1309  ? TRIG 177.0 (H) 08/03/2021 1134  ? TRIG 92 06/09/2006 0818  ? HDL 33.90 (L) 08/03/2021 1134  ? HDL 28 (L) 07/23/2020 1309  ? CHOLHDL 4 08/03/2021 1134  ? VLDL 35.4 08/03/2021 1134  ? Jerry Simpson 63 08/03/2021 1134  ? Jerry Simpson  71 07/23/2020 1309  ? LDLDIRECT 82.0 07/23/2020 1309  ? ?  ? ?Wt Readings from Last 3 Encounters:  ?08/20/21 206 lb 9.6 oz (93.7 kg)  ?08/05/21 209 lb 6.4 oz (95 kg)  ?07/28/20 215 lb (97.5 kg)  ?  ? ? ?Other studies Reviewed: ?Additional studies/ records that were reviewed today include:   Labs ?Review of the above records demonstrates:  Please see elsewhere in the note.   ? ? ?ASSESSMENT AND PLAN: ? ?CAD:  The patient has no new sypmtoms.  No further cardiovascular testing is indicated.  We will continue with aggressive risk reduction and meds as listed. ? ?DYSLPIDEMIA: His LDL 63.  No change in therapy. ? ?MURMUR: There was an echo in 2021 that did not suggest anything other than some mild aortic sclerosis and a width of mitral regurgitation.  No further work-up. ? ? ?Current medicines are reviewed at length with the patient today.  The patient does not have concerns regarding medicines. ? ?The following changes have been made: None ? ?Labs/ tests ordered today include: None ? ?Orders Placed This Encounter  ?Procedures  ? EKG 12-Lead  ? ? ? ?Disposition:   FU with me in 12 months. ? ? ?Signed, ?Minus Breeding, MD  ?08/20/2021 11:11 AM    ?[place] ? ? ? ?

## 2021-08-20 ENCOUNTER — Encounter: Payer: Self-pay | Admitting: Cardiology

## 2021-08-20 ENCOUNTER — Ambulatory Visit: Payer: Medicare HMO | Admitting: Cardiology

## 2021-08-20 ENCOUNTER — Other Ambulatory Visit: Payer: Self-pay

## 2021-08-20 VITALS — BP 160/82 | HR 85 | Ht 74.0 in | Wt 206.6 lb

## 2021-08-20 DIAGNOSIS — I2510 Atherosclerotic heart disease of native coronary artery without angina pectoris: Secondary | ICD-10-CM | POA: Diagnosis not present

## 2021-08-20 DIAGNOSIS — E785 Hyperlipidemia, unspecified: Secondary | ICD-10-CM

## 2021-08-20 NOTE — Patient Instructions (Signed)

## 2021-09-02 ENCOUNTER — Other Ambulatory Visit: Payer: Self-pay

## 2021-09-02 DIAGNOSIS — E785 Hyperlipidemia, unspecified: Secondary | ICD-10-CM

## 2021-09-02 MED ORDER — ROSUVASTATIN CALCIUM 10 MG PO TABS: 10.0000 mg | ORAL_TABLET | Freq: Every day | ORAL | 0 refills | Status: DC

## 2021-10-12 ENCOUNTER — Telehealth: Payer: Self-pay | Admitting: Family Medicine

## 2021-10-12 NOTE — Telephone Encounter (Signed)
Would you advise urgent care with patient being out of town? ? ?

## 2021-10-12 NOTE — Telephone Encounter (Signed)
Chief Complaint Neck Pain ?Reason for Call Symptomatic / Request for Health Information ?Initial Comment Caller states he is experiencing sharp pains in his ?neck when he moves his neck to the left. He is ?currently out of town and needs advice on what he ?can do for relief. ?Translation No ?Nurse Assessment ?Nurse: Eugenio Hoes, RN, Sarah Date/Time Eilene Ghazi Time): 10/10/2021 5:07:32 PM ?Confirm and document reason for call. If ?symptomatic, describe symptoms. ?---Caller states he is having sharp pains in his neck ?when he looks to the left. It started around lunchtime. ?He describes the pain as severe when it happens. ?Does the patient have any new or worsening ?symptoms? ---Yes ?Will a triage be completed? ---Yes ?Related visit to physician within the last 2 weeks? ---No ?Does the PT have any chronic conditions? (i.e. ?diabetes, asthma, this includes High risk factors for ?pregnancy, etc.) ?---Yes ?List chronic conditions. ---hyperlipidemia ?Is this a behavioral health or substance abuse call? ---No ?Guidelines ?Guideline Title Affirmed Question Affirmed Notes Nurse Date/Time (Eastern ?Time) ?Neck Pain or ?Stiffness ?[1] Age > 50 AND ?[2] no history of prior ?similar neck pain ?Eugenio Hoes, RN, Sarah 10/10/2021 5:08:35 ?PM ?Disp. Time (Eastern ?Time) Disposition Final User ?PLEASE NOTE: All timestamps contained within this report are represented as [place] Standard Time. ?CONFIDENTIALTY NOTICE: This fax transmission is intended only for the addressee. It contains information that is legally privileged, confidential or ?otherwise protected from use or disclosure. If you are not the intended recipient, you are strictly prohibited from reviewing, disclosing, copying using ?or disseminating any of this information or taking any action in reliance on or regarding this information. If you have received this fax in error, please ?notify us immediately by telephone so that we can arrange for its return to [place]. Phone: [phone],  Toll-Free: [phone], Fax: [phone] ?Page: 2 of 2 ?Call Id: [id number] ?10/10/2021 5:19:34 PM See PCP within 2 Weeks Yes Eugenio Hoes, RN, Sarah ?Caller Disagree/Comply Comply ?Caller Understands Yes ?PreDisposition Call Doctor ?Care Advice Given Per Guideline ?SEE PCP WITHIN 2 WEEKS: * You need to be seen for this ongoing problem within the next 2 weeks. * PCP VISIT: Call your ?doctor (or NP/PA) during regular office hours and make an appointment. PAIN MEDICINES: * For pain relief, you can take either ?acetaminophen, ibuprofen, or naproxen. * They are over-the-counter (OTC) pain drugs. You can buy them at the drugstore. SLEEP: ?* Sleep on the back or side, not the abdomen. CALL BACK IF: * Moderate pain (e.g., interferes with normal activities) lasts over ?3 days * Pain lasts over 2 weeks * Pain begins to shoot into the arms * Numbness or weakness occurs in your arms or legs * You ?become worse CARE ADVICE given per Neck Pain (Adult) guideline. ?Referrals ?GO TO FACILITY UNDECIDED ?REFERRED TO PCP OFFICE ?

## 2021-10-15 ENCOUNTER — Encounter: Payer: Self-pay | Admitting: Family Medicine

## 2021-10-16 ENCOUNTER — Other Ambulatory Visit: Payer: Self-pay

## 2021-10-16 ENCOUNTER — Telehealth (INDEPENDENT_AMBULATORY_CARE_PROVIDER_SITE_OTHER): Payer: Medicare HMO | Admitting: Family Medicine

## 2021-10-16 ENCOUNTER — Encounter: Payer: Self-pay | Admitting: Family Medicine

## 2021-10-16 DIAGNOSIS — M542 Cervicalgia: Secondary | ICD-10-CM

## 2021-10-16 MED ORDER — PREDNISONE 10 MG PO TABS: ORAL_TABLET | ORAL | 0 refills | Status: DC

## 2021-10-16 MED ORDER — TIZANIDINE HCL 4 MG PO TABS: 4.0000 mg | ORAL_TABLET | Freq: Three times a day (TID) | ORAL | 0 refills | Status: DC | PRN

## 2021-10-16 NOTE — Progress Notes (Signed)
? ?Virtual Visit via Video  ? ?I connected with patient on 10/16/21 at  2:40 PM EDT by a video enabled telemedicine application and verified that I am speaking with the correct person using two identifiers. ? ?Location patient: Home ?Location provider: [facility], Office ?Persons participating in the virtual visit: Patient, Provider, [place] Jerene Pitch W) ? ?I discussed the limitations of evaluation and management by telemedicine and the availability of in person appointments. The patient expressed understanding and agreed to proceed. ? ?Subjective:  ? ?HPI:  ? ?Neck Pain- sxs started on Sunday w/ 'an ice pick' pain on the L side of neck.  Worsened w/ any movement to the L.  Sxs improved Monday but again worsened on Tuesday and became bilateral.  Pt reports pain is now more similar to his previous tension type headaches.  Has been using Ibuprofen and Baclofen w/ some relief.  Pt thought 'it was over' this morning b/c pain had greatly improved.  Sxs have returned. ? ?ROS:  ? ?See pertinent positives and negatives per HPI. ? ?Patient Active Problem List  ? Diagnosis Date Noted  ? Murmur 01/24/2020  ? SOB (shortness of breath) 01/24/2020  ? Overweight (BMI 25.0-29.9) 01/17/2020  ? Upper airway cough syndrome 06/12/2015  ? Physical exam 07/09/2014  ? Bladder tumor   ? Reactive airway disease that is not asthma 05/13/2014  ? Blood donor 07/04/2013  ? HYPERGLYCEMIA, FASTING 07/16/2010  ? Coronary atherosclerosis 10/16/2008  ? BENIGN PROSTATIC HYPERTROPHY 10/16/2008  ? SKIN CANCER, HX OF 10/16/2008  ? COLONIC POLYPS, HX OF 10/16/2008  ? ELEVATED BLOOD PRESSURE WITHOUT DIAGNOSIS OF HYPERTENSION 06/05/2008  ? Hyperlipidemia 09/27/2007  ? GERD 06/20/2007  ? Allergic rhinitis, cause unspecified 05/05/2007  ?  ?Social History  ? ?Tobacco Use  ? Smoking status: Never  ? Smokeless tobacco: Never  ?Substance Use Topics  ? Alcohol use: Yes  ?  Alcohol/week: 7.0 standard drinks  ?  Types: 7 drink(s) per week  ?  Comment:  < 7/  week  ? ? ?Current Outpatient Medications:  ?  ASPIRIN 81 PO, Take by mouth., Disp: , Rfl:  ?  Coenzyme Q10 (CO Q-10) 100 MG CAPS, , Disp: , Rfl:  ?  COVID-19 At Home Test (COVID-19 OTC ANTIGEN 1-PACK VI), by In Vitro route., Disp: , Rfl:  ?  fexofenadine (ALLEGRA) 180 MG tablet, Take 180 mg by mouth daily as needed for allergies. , Disp: , Rfl:  ?  finasteride (PROPECIA) 1 MG tablet, , Disp: , Rfl:  ?  fluticasone (FLONASE) 50 MCG/ACT nasal spray, Place 2 sprays into both nostrils daily., Disp: 16 g, Rfl: 1 ?  pantoprazole (PROTONIX) 40 MG tablet, Take 1 tablet (40 mg total) by mouth daily., Disp: 100 tablet, Rfl: 1 ?  rosuvastatin (CRESTOR) 10 MG tablet, Take 1 tablet (10 mg total) by mouth daily., Disp: 90 tablet, Rfl: 0 ? ?Allergies  ?Allergen Reactions  ? Sulfonamide Derivatives Rash  ?  RASH ? ?Because of a history of documented adverse serious drug reaction;Medi Alert bracelet  is recommended  ? Claritin [Loratadine]   ?  Pt states it affects his prostate.   ? ? ?Objective:  ? ?There were no vitals taken for this visit. ?AAOx3, NAD ?NCAT, EOMI ?No obvious CN deficits ?Coloring WNL ?Pt is able to speak clearly, coherently without shortness of breath or increased work of breathing.  ?Thought process is linear.  Mood is appropriate.  ? ?Assessment and Plan:  ? ?Neck pain- new.  Pt initially  reports radicular sxs- 'like an ice pick' to the back of his neck.  Thankfully this subsided and settled into his usual neck pain causing tension headache.  Given that this has persisted all week, will start Prednisone taper and start Tizanidine '4mg'$  TID PRN.  Encouraged heating pad.  Discussed acetaminophen rather than NSAIDs while on Prednisone.  Pt expressed understanding and is in agreement w/ plan.  ? ? ?Annye Asa, MD ?10/16/2021 ? ?

## 2021-10-16 NOTE — Patient Instructions (Signed)
° ° ° °  If you have lab work done today you will be contacted with your lab results within the next 2 weeks.  If you have not heard from us then please contact us. The fastest way to get your results is to register for My Chart. ° ° °IF you received an x-ray today, you will receive an invoice from [facility]. Please contact [facility] at [phone] with questions or concerns regarding your invoice.  ° °IF you received labwork today, you will receive an invoice from [facility]. Please contact [facility] at [phone] with questions or concerns regarding your invoice.  ° °Our billing staff will not be able to assist you with questions regarding bills from these companies. ° °You will be contacted with the lab results as soon as they are available. The fastest way to get your results is to activate your My Chart account. Instructions are located on the last page of this paperwork. If you have not heard from us regarding the results in 2 weeks, please contact this office. °  ° ° ° °

## 2021-10-17 ENCOUNTER — Encounter: Payer: Self-pay | Admitting: Family Medicine

## 2021-11-17 DIAGNOSIS — Z806 Family history of leukemia: Secondary | ICD-10-CM | POA: Diagnosis not present

## 2021-11-17 DIAGNOSIS — Z882 Allergy status to sulfonamides status: Secondary | ICD-10-CM | POA: Diagnosis not present

## 2021-11-17 DIAGNOSIS — Z8249 Family history of ischemic heart disease and other diseases of the circulatory system: Secondary | ICD-10-CM | POA: Diagnosis not present

## 2021-11-17 DIAGNOSIS — Z833 Family history of diabetes mellitus: Secondary | ICD-10-CM | POA: Diagnosis not present

## 2021-11-17 DIAGNOSIS — Z7982 Long term (current) use of aspirin: Secondary | ICD-10-CM | POA: Diagnosis not present

## 2021-11-17 DIAGNOSIS — L649 Androgenic alopecia, unspecified: Secondary | ICD-10-CM | POA: Diagnosis not present

## 2021-11-17 DIAGNOSIS — Z801 Family history of malignant neoplasm of trachea, bronchus and lung: Secondary | ICD-10-CM | POA: Diagnosis not present

## 2021-11-17 DIAGNOSIS — R030 Elevated blood-pressure reading, without diagnosis of hypertension: Secondary | ICD-10-CM | POA: Diagnosis not present

## 2021-11-17 DIAGNOSIS — E785 Hyperlipidemia, unspecified: Secondary | ICD-10-CM | POA: Diagnosis not present

## 2021-11-17 DIAGNOSIS — Z85828 Personal history of other malignant neoplasm of skin: Secondary | ICD-10-CM | POA: Diagnosis not present

## 2021-11-17 DIAGNOSIS — Z008 Encounter for other general examination: Secondary | ICD-10-CM | POA: Diagnosis not present

## 2021-11-17 DIAGNOSIS — Z888 Allergy status to other drugs, medicaments and biological substances status: Secondary | ICD-10-CM | POA: Diagnosis not present

## 2021-11-24 ENCOUNTER — Other Ambulatory Visit: Payer: Self-pay

## 2021-11-24 DIAGNOSIS — E785 Hyperlipidemia, unspecified: Secondary | ICD-10-CM

## 2021-11-24 MED ORDER — ROSUVASTATIN CALCIUM 10 MG PO TABS: 10.0000 mg | ORAL_TABLET | Freq: Every day | ORAL | 0 refills | Status: DC

## 2021-11-25 ENCOUNTER — Ambulatory Visit (INDEPENDENT_AMBULATORY_CARE_PROVIDER_SITE_OTHER): Payer: Medicare HMO

## 2021-11-25 DIAGNOSIS — Z0000 Encounter for general adult medical examination without abnormal findings: Secondary | ICD-10-CM | POA: Diagnosis not present

## 2021-11-25 NOTE — Progress Notes (Signed)
Subjective:   Jerry Simpson is a 76 y.o. male who presents for an Subsequent Medicare Annual Wellness Visit.   I connected with Brock Bad  today by telephone and verified that I am speaking with the correct person using two identifiers. Location patient: home Location provider: work Persons participating in the virtual visit: patient, provider.   I discussed the limitations, risks, security and privacy concerns of performing an evaluation and management service by telephone and the availability of in person appointments. I also discussed with the patient that there may be a patient responsible charge related to this service. The patient expressed understanding and verbally consented to this telephonic visit.    Interactive audio and video telecommunications were attempted between this provider and patient, however failed, due to patient having technical difficulties OR patient did not have access to video capability.  We continued and completed visit with audio only.    Review of Systems     Cardiac Risk Factors include: advanced age (>56mn, >>72women);male gender;dyslipidemia     Objective:    Today's Vitals   There is no height or weight on file to calculate BMI.     11/25/2021   10:29 AM 07/28/2020    9:49 AM 12/29/2017   10:46 AM 10/11/2017    8:17 AM 10/04/2017    1:36 PM 12/15/2016    9:50 AM  Advanced Directives  Does Patient Have a Medical Advance Directive? Yes Yes Yes Yes Yes Yes  Type of AParamedicof [place]aled[place]Living will [place]Living will HBella VillaLiving will   [place] will  Copy of [place] Chart? No - copy requested Yes - validated most recent copy scanned in chart (See row information) No - copy requested   No - copy requested    Current Medications (verified) Outpatient Encounter Medications as of 11/25/2021  Medication Sig   ASPIRIN 81 PO  Take by mouth.   Coenzyme Q10 (CO Q-10) 100 MG CAPS    COVID-19 At Home Test (COVID-19 OTC ANTIGEN 1-PACK VI) by In Vitro route.   fexofenadine (ALLEGRA) 180 MG tablet Take 180 mg by mouth daily as needed for allergies.    finasteride (PROPECIA) 1 MG tablet    fluticasone (FLONASE) 50 MCG/ACT nasal spray Place 2 sprays into both nostrils daily.   pantoprazole (PROTONIX) 40 MG tablet Take 1 tablet (40 mg total) by mouth daily.   rosuvastatin (CRESTOR) 10 MG tablet Take 1 tablet (10 mg total) by mouth daily.   tiZANidine (ZANAFLEX) 4 MG tablet Take 1 tablet (4 mg total) by mouth every 8 (eight) hours as needed for muscle spasms.   predniSONE (DELTASONE) 10 MG tablet 3 tabs x3 days and then 2 tabs x3 days and then 1 tab x3 days.  Take w/ food. (Patient not taking: Reported on 11/25/2021)   No facility-administered encounter medications on file as of 11/25/2021.    Allergies (verified) Sulfonamide derivatives and Claritin [loratadine]   History: Past Medical History:  Diagnosis Date   Adenomatous polyps    Allergy    Borderline systolic HTN    BPH (benign prostatic hyperplasia)    elevated PSA, Dr WJeffie Pollock  CAD (coronary artery disease)    Cataract    removed both eyes    Environmental allergies    GERD (gastroesophageal reflux disease)    Hyperlipidemia    Past Surgical History:  Procedure Laterality Date   ANGIOPLASTY  2004  stent placement ;sees Dr Ron Parker   CATARACT EXTRACTION, BILATERAL  2012   COLONOSCOPY     colonoscopy with polypectomy     X2, Dr Henrene Pastor   POLYPECTOMY     prostatic artery embolization     TONSILLECTOMY  1962   Family History  Problem Relation Age of Onset   Hypertension Father    Heart attack Father 32   Diabetes Father    Cancer Brother        lung cancer ; leukemia terminally   Heart attack Brother 24   Heart disease Paternal Uncle 31   Stroke Maternal Grandmother        >65   Colon cancer Neg Hx    Stomach cancer Neg Hx    Colon polyps Neg Hx     Esophageal cancer Neg Hx    Rectal cancer Neg Hx    Social History   Socioeconomic History   Marital status: Married    Spouse name: Not on file   Number of children: Not on file   Years of education: Not on file   Highest education level: Not on file  Occupational History   Not on file  Tobacco Use   Smoking status: Never   Smokeless tobacco: Never  Vaping Use   Vaping Use: Never used  Substance and Sexual Activity   Alcohol use: Yes    Alcohol/week: 7.0 standard drinks    Types: 7 drink(s) per week    Comment:  < 7/ week   Drug use: No   Sexual activity: Not on file  Other Topics Concern   Not on file  Social History Narrative   Lives with wife.     Social Determinants of Health   Financial Resource Strain: Low Risk    Difficulty of Paying Living Expenses: Not hard at all  Food Insecurity: No Food Insecurity   Worried About Charity fundraiser in the Last Year: Never true   [place] in the Last Year: Never true  Transportation Needs: No Transportation Needs   Lack of Transportation (Medical): No   Lack of Transportation (Non-Medical): No  Physical Activity: Inactive   Days of Exercise per Week: 0 days   Minutes of Exercise per Session: 0 min  Stress: No Stress Concern Present   Feeling of Stress : Not at all  Social Connections: Moderately Integrated   Frequency of Communication with Friends and Family: Twice a week   Frequency of Social Gatherings with Friends and Family: Twice a week   Attends Religious Services: 1 to 4 times per year   Active Member of [employer] or Organizations: No   Attends Music therapist: Never   Marital Status: Married    Tobacco Counseling Counseling given: Not Answered   Clinical Intake:  Pre-visit preparation completed: Yes  Pain : No/denies pain     Nutritional Risks: None Diabetes: No  How often do you need to have someone help you when you read instructions, pamphlets, or other written materials  from your doctor or pharmacy?: 1 - Never What is the last grade level you completed in school?: college  Diabetic?no   Interpreter Needed?: No  Information entered by :: [facility] Daily [facility]    11/25/2021   10:30 AM 08/05/2021    9:01 AM  In your present state of health, do you have any difficulty performing the following activities:  Hearing? 0 0  Vision? 0 0  Difficulty concentrating or  making decisions? 0 0  Walking or climbing stairs? 0 0  Dressing or bathing? 0 0  Doing errands, shopping? 0 0  Preparing Food and eating ? N   Using the Toilet? N   In the past six months, have you accidently leaked urine? N   Do you have problems with loss of bowel control? N   Managing your Medications? N   Managing your Finances? N   Housekeeping or managing your Housekeeping? N     Patient Care Team: Midge Minium, MD as PCP - General (Family Medicine) Irene Shipper, MD as Consulting Physician (Gastroenterology) Beryle Lathe, MD as Referring Physician (General Surgery) [facility], Michae Kava, MD as Attending Physician Allyn Kenner, MD (Dermatology) [name], [facility] as Pharmacist (Pharmacist)  Indicate any recent Medical Services you may have received from other than [facility] providers in the past year (date may be approximate).     Assessment:   This is a routine wellness examination for [facility].  Hearing/Vision screen No results found.  Dietary issues and exercise activities discussed: Current Exercise Habits: The patient does not participate in regular exercise at present, Exercise limited by: None identified   Goals Addressed   None    Depression Screen    11/25/2021   10:30 AM 11/25/2021   10:28 AM 10/16/2021    2:46 PM 08/05/2021    9:03 AM 10/23/2020   11:34 AM 10/20/2020    9:16 AM 10/20/2020    9:14 AM  PHQ 2/9 Scores  PHQ - 2 Score 0 0 0 0 0 0 0  PHQ- 9 Score    1       Fall Risk    11/25/2021   10:30 AM 10/16/2021    2:46 PM 08/05/2021     9:04 AM 10/23/2020   11:34 AM 10/20/2020    9:16 AM  [place] in the past year? 0 0 0 0 0  Number falls in past yr: 0 0  0 0  Injury with Fall? 0 0  0 0  Risk for fall due to :  No Fall Risks No Fall Risks    Follow up Falls evaluation completed Falls evaluation completed Falls evaluation completed Falls evaluation completed Falls evaluation completed    [place]:  Any stairs in or around the home? Yes  If so, are there any without handrails? No  Home free of loose throw rugs in walkways, pet beds, electrical cords, etc? Yes  Adequate lighting in your home to reduce risk of falls? Yes   ASSISTIVE DEVICES UTILIZED TO PREVENT FALLS:  Life alert? No  Use of a cane, walker or w/c? No  Grab bars in the bathroom? No  Shower chair or bench in shower? Yes  Elevated toilet seat or a handicapped toilet? No     Cognitive Function:  Normal cognitive status assessed by telephone conversation  by this Nurse Health Advisor. No abnormalities found.        Immunizations Immunization History  Administered Date(s) Administered   Fluad Quad(high Dose 65+) 05/06/2020, 04/06/2021   Influenza Split 03/30/2011, 04/10/2012   Influenza Whole 04/25/2007, 04/03/2009, 06/05/2010   Influenza, High Dose Seasonal PF 05/08/2013, 04/02/2015, 04/08/2016, 03/09/2019   Influenza,inj,Quad PF,6+ Mos 04/18/2014, 03/14/2017   Moderna Sars-Covid-2 Vaccination 08/02/2019, 08/31/2019   Pneumococcal Conjugate-13 07/04/2013   Pneumococcal Polysaccharide-23 07/09/2014   Tdap 06/30/2012   Zoster, Live 03/30/2011    TDAP status: Up to date  Flu  Vaccine status: Up to date  Pneumococcal vaccine status: Up to date  Covid-19 vaccine status: Completed vaccines  Qualifies for Shingles Vaccine? Yes   Zostavax completed No   Shingrix Completed?: No.    Education has been provided regarding the importance of this vaccine. Patient has been advised to call insurance company to  determine out of pocket expense if they have not yet received this vaccine. Advised may also receive vaccine at local pharmacy or Health Dept. Verbalized acceptance and understanding.  Screening Tests Health Maintenance  Topic Date Due   Hepatitis C Screening  Never done   Zoster Vaccines- Shingrix (1 of 2) Never done   COVID-19 Vaccine (3 - Booster for Moderna series) 10/26/2019   INFLUENZA VACCINE  01/19/2022   TETANUS/TDAP  06/30/2022   COLONOSCOPY (Pts 45-23yr Insurance coverage will need to be confirmed)  10/12/2022   Pneumonia Vaccine 76 Years old  Completed   HPV VACCINES  Aged Out    Health Maintenance  Health Maintenance Due  Topic Date Due   Hepatitis C Screening  Never done   Zoster Vaccines- Shingrix (1 of 2) Never done   COVID-19 Vaccine (3 - Booster for Moderna series) 10/26/2019    Colorectal cancer screening: Type of screening: Colonoscopy. Completed 10/11/2017. Repeat every 5 years  Lung Cancer Screening: (Low Dose CT Chest recommended if Age 74-80years, 30 pack-year currently smoking OR have quit w/in 15years.) does not qualify.   Lung Cancer Screening Referral: n/a  Additional Screening:  Hepatitis C Screening: does not qualify;   Vision Screening: Recommended annual ophthalmology exams for early detection of glaucoma and other disorders of the eye. Is the patient up to date with their annual eye exam?  Yes  Who is the provider or what is the name of the office in which the patient attends annual eye exams? Dr.tanner  If pt is not established with a provider, would they like to be referred to a provider to establish care? No .   Dental Screening: Recommended annual dental exams for proper oral hygiene  Community Resource Referral / Chronic Care Management: CRR required this visit?  No   CCM required this visit?  No      Plan:     I have personally reviewed and noted the following in the patient's chart:   Medical and social history Use of  alcohol, tobacco or illicit drugs  Current medications and supplements including opioid prescriptions. Patient is not currently taking opioid prescriptions. Functional ability and status Nutritional status Physical activity Advanced directives List of other physicians Hospitalizations, surgeries, and ER visits in previous 12 months Vitals Screenings to include cognitive, depression, and falls Referrals and appointments  In addition, I have reviewed and discussed with patient certain preventive protocols, quality metrics, and best practice recommendations. A written personalized care plan for preventive services as well as general preventive health recommendations were provided to patient.     LRandel Pigg LPN   60/07/5425  Nurse Notes: none

## 2021-11-26 ENCOUNTER — Ambulatory Visit: Payer: Medicare HMO

## 2021-12-04 ENCOUNTER — Ambulatory Visit (INDEPENDENT_AMBULATORY_CARE_PROVIDER_SITE_OTHER): Payer: Medicare HMO | Admitting: Family Medicine

## 2021-12-04 ENCOUNTER — Encounter: Payer: Self-pay | Admitting: Family Medicine

## 2021-12-04 VITALS — BP 116/68 | HR 64 | Temp 98.2°F | Resp 16 | Ht 74.0 in | Wt 208.4 lb

## 2021-12-04 DIAGNOSIS — M542 Cervicalgia: Secondary | ICD-10-CM | POA: Diagnosis not present

## 2021-12-04 MED ORDER — PREDNISONE 10 MG PO TABS: ORAL_TABLET | ORAL | 0 refills | Status: DC

## 2021-12-04 NOTE — Patient Instructions (Signed)
Follow up as needed or as scheduled START the Prednisone as directed- take w/ food Use a heating pad for pain relief We'll call you with your Sports Med appt Drink LOTS of fluids Use the muscle relaxer prior to bed to help w/ spasm Call with any questions or concerns Hang in there!!!

## 2021-12-04 NOTE — Progress Notes (Unsigned)
   Subjective:    Patient ID: Jerry Simpson, male    DOB: 1946-03-31, 76 y.o.   MRN: [id number]  HPI Neck pain- pt was seen on 4/28 for neck pain.  Was started on Prednisone w/ good relief.  Since then has had bilateral neck pain and spasm.  Unable to turn head to the L.  No relief w/ muscle relaxer and this caused dry mouth and sedation.  Pt has hx of prior tension HAs but doesn't recall an episode that wouldn't resolve.  Denies fevers, chills, visual changes, nausea/vomiting.   Review of Systems For ROS see HPI     Objective:   Physical Exam Vitals reviewed.  Constitutional:      General: He is not in acute distress.    Appearance: Normal appearance. He is not ill-appearing.  HENT:     Head: Normocephalic and atraumatic.  Eyes:     Extraocular Movements: Extraocular movements intact.     Conjunctiva/sclera: Conjunctivae normal.  Neck:     Comments: Decreased ROM- particularly limited rotation to L Skin:    General: Skin is warm and dry.  Neurological:     General: No focal deficit present.     Mental Status: He is alert and oriented to person, place, and time.  Psychiatric:        Mood and Affect: Mood normal.        Behavior: Behavior normal.           Assessment & Plan:  Neck pain- deteriorated.  Pt initially had improvement w/ Prednisone but this was short lived.  Will repeat short course taper and refer to Sports Med.  Encouraged him to use a heating pad for relief.  Since the muscle relaxer caused excessive sedation he is to only use this before bed.  Pt expressed understanding and is in agreement w/ plan.

## 2021-12-09 NOTE — Progress Notes (Signed)
Jerry Simpson D.[employer] [place] Phone: [phone]   Assessment and Plan:     1. Neck pain 2. DDD (degenerative disc disease), cervical -Chronic with exacerbation, initial sports medicine visit - Significant relief in acute on chronic flare of neck pain with patient in the process of completing a second course of prednisone - Suspect that patient's 2 recent flares of neck pain are due to underlying cervical DDD. - Recommend completing prednisone course and then discontinuing medication.  If future flares were to occur, patient could use ibuprofen 800 mg 3 times daily for 7 to 10 days and if this is ineffective and could call us or return to clinic for a prednisone Dosepak. - Start HEP for neck to decrease number of flare episodes -X-ray obtained in clinic.  My interpretation: No acute fracture or vertebral collapse.  Cortical changes most significant at C4-5, C5-6, C6-7 with anterior vertebral spurring  Pertinent previous records reviewed include none   Follow Up: As needed if no improvement or worsening of symptoms   Subjective:   I, Jerry Simpson, am serving as a Education administrator for Doctor Glennon Mac  Chief Complaint: neck pain   HPI:  12/10/2021 Patient is a 76 year old male complaining of neck pain. Patient states was seen on 4/28 for neck pain.  Was started on Prednisone w/ good relief.  Since then has had bilateral neck pain and spasm.  Unable to turn head to the L.  No relief w/ muscle relaxer and this caused dry mouth and sedation.  Pt has hx of prior tension HAs but doesn't recall an episode that wouldn't resolve.  Relevant Historical Information: None  Additional pertinent review of systems negative.   Current Outpatient Medications:    ASPIRIN 81 PO, Take by mouth., Disp: , Rfl:    Coenzyme Q10 (CO Q-10) 100 MG CAPS, , Disp: , Rfl:    fexofenadine (ALLEGRA) 180 MG tablet, Take 180 mg by mouth daily  as needed for allergies. , Disp: , Rfl:    finasteride (PROPECIA) 1 MG tablet, , Disp: , Rfl:    fluticasone (FLONASE) 50 MCG/ACT nasal spray, Place 2 sprays into both nostrils daily., Disp: 16 g, Rfl: 1   pantoprazole (PROTONIX) 40 MG tablet, Take 1 tablet (40 mg total) by mouth daily., Disp: 100 tablet, Rfl: 1   predniSONE (DELTASONE) 10 MG tablet, 3 tabs x3 days and then 2 tabs x3 days and then 1 tab x3 days.  Take w/ food., Disp: 18 tablet, Rfl: 0   rosuvastatin (CRESTOR) 10 MG tablet, Take 1 tablet (10 mg total) by mouth daily., Disp: 90 tablet, Rfl: 0   tiZANidine (ZANAFLEX) 4 MG tablet, Take 1 tablet (4 mg total) by mouth every 8 (eight) hours as needed for muscle spasms., Disp: 60 tablet, Rfl: 0   Objective:     Vitals:   12/10/21 0903  BP: 118/72  Pulse: 98  SpO2: 99%  Weight: 208 lb (94.3 kg)  Height: '6\' 2"'$  (1.88 m)      Body mass index is 26.71 kg/m.    Physical Exam:    Cervical Spine: Posture normal Skin: normal, intact  Neurological:   Strength:  Right  Left   Deltoid 5/5 5/5  Bicep 5/5  5/5  Tricep 5/5 5/5  Wrist Flexion 5/5 5/5  Wrist Extension 5/5 5/5  Grip 5/5 5/5  Finger Abduction 5/5 5/5   Sensation: intact to light touch in upper  extremities bilaterally  Spurling's:  negative bilaterally Neck ROM: Full active ROM   NTTP: cervical spinous processes, cervical paraspinal, thoracic paraspinal, trapezius    Electronically signed by:  Jerry Simpson D.[facility] 9:42 AM 12/10/21

## 2021-12-10 ENCOUNTER — Ambulatory Visit: Payer: Medicare HMO | Admitting: Sports Medicine

## 2021-12-10 ENCOUNTER — Ambulatory Visit (INDEPENDENT_AMBULATORY_CARE_PROVIDER_SITE_OTHER): Payer: Medicare HMO

## 2021-12-10 VITALS — BP 118/72 | HR 98 | Ht 74.0 in | Wt 208.0 lb

## 2021-12-10 DIAGNOSIS — M5030 Other cervical disc degeneration, unspecified cervical region: Secondary | ICD-10-CM

## 2021-12-10 DIAGNOSIS — M542 Cervicalgia: Secondary | ICD-10-CM

## 2021-12-10 NOTE — Patient Instructions (Addendum)
Good to see you  Neck HEP As needed follow up  

## 2022-01-20 ENCOUNTER — Encounter: Payer: Self-pay | Admitting: Family Medicine

## 2022-01-20 ENCOUNTER — Telehealth (INDEPENDENT_AMBULATORY_CARE_PROVIDER_SITE_OTHER): Payer: Medicare HMO | Admitting: Family Medicine

## 2022-01-20 DIAGNOSIS — J40 Bronchitis, not specified as acute or chronic: Secondary | ICD-10-CM | POA: Diagnosis not present

## 2022-01-20 MED ORDER — AZITHROMYCIN 250 MG PO TABS: ORAL_TABLET | ORAL | 0 refills | Status: DC

## 2022-01-20 NOTE — Progress Notes (Signed)
Virtual Visit via Video   I connected with patient on 01/20/22 at  9:40 AM EDT by a video enabled telemedicine application and verified that I am speaking with the correct person using two identifiers.  Location patient: Home Location provider: [place], Office Persons participating in the virtual visit: Patient, Provider, [place] Marcille Blanco C)  I discussed the limitations of evaluation and management by telemedicine and the availability of in person appointments. The patient expressed understanding and agreed to proceed.  Interactive audio and video telecommunications were attempted between this provider and patient, however failed, due to patient having technical difficulties OR patient did not have access to video capability.  We continued and completed visit with audio only.   Subjective:   HPI:   URI- 'it just keeps getting worse'.  Cough is productive of 'phlegm balls'.  Taking Allegra, switched to Xyzal.  Using Flonase.  Pt reports sxs started ~1 month ago but has been continually worsening.  No fevers or chills.  No sinus pain/pressure.  Pt has hx of bronchitis that responds well to Zpack  ROS:   See pertinent positives and negatives per HPI.  Patient Active Problem List   Diagnosis Date Noted   Murmur 01/24/2020   SOB (shortness of breath) 01/24/2020   Overweight (BMI 25.0-29.9) 01/17/2020   Upper airway cough syndrome 06/12/2015   Physical exam 07/09/2014   Bladder tumor    Reactive airway disease that is not asthma 05/13/2014   Blood donor 07/04/2013   HYPERGLYCEMIA, FASTING 07/16/2010   Coronary atherosclerosis 10/16/2008   BENIGN PROSTATIC HYPERTROPHY 10/16/2008   SKIN CANCER, HX OF 10/16/2008   COLONIC POLYPS, HX OF 10/16/2008   ELEVATED BLOOD PRESSURE WITHOUT DIAGNOSIS OF HYPERTENSION 06/05/2008   Hyperlipidemia 09/27/2007   GERD 06/20/2007   Allergic rhinitis, cause unspecified 05/05/2007    Social History   Tobacco Use   Smoking status: Never    Smokeless tobacco: Never  Substance Use Topics   Alcohol use: Yes    Alcohol/week: 7.0 standard drinks of alcohol    Types: 7 drink(s) per week    Comment:  < 7/ week    Current Outpatient Medications:    ASPIRIN 81 PO, Take by mouth., Disp: , Rfl:    Coenzyme Q10 (CO Q-10) 100 MG CAPS, , Disp: , Rfl:    fexofenadine (ALLEGRA) 180 MG tablet, Take 180 mg by mouth daily as needed for allergies. , Disp: , Rfl:    finasteride (PROPECIA) 1 MG tablet, , Disp: , Rfl:    fluticasone (FLONASE) 50 MCG/ACT nasal spray, Place 2 sprays into both nostrils daily., Disp: 16 g, Rfl: 1   pantoprazole (PROTONIX) 40 MG tablet, Take 1 tablet (40 mg total) by mouth daily., Disp: 100 tablet, Rfl: 1   predniSONE (DELTASONE) 10 MG tablet, 3 tabs x3 days and then 2 tabs x3 days and then 1 tab x3 days.  Take w/ food., Disp: 18 tablet, Rfl: 0   rosuvastatin (CRESTOR) 10 MG tablet, Take 1 tablet (10 mg total) by mouth daily., Disp: 90 tablet, Rfl: 0   tiZANidine (ZANAFLEX) 4 MG tablet, Take 1 tablet (4 mg total) by mouth every 8 (eight) hours as needed for muscle spasms. (Patient not taking: Reported on 01/20/2022), Disp: 60 tablet, Rfl: 0  Allergies  Allergen Reactions   Sulfonamide Derivatives Rash    RASH  Because of a history of documented adverse serious drug reaction;Medi Alert bracelet  is recommended   Claritin [Loratadine]     Pt states  it affects his prostate.     Objective:   There were no vitals taken for this visit. Pt is able to speak clearly, coherently without shortness of breath or increased work of breathing. + hacking cough Thought process is linear.  Mood is appropriate.   Assessment and Plan:  Bronchitis- new.  Pt's duration of sxs and the fact that they are worsening rather than improving and not changing w/ use of allergy medication, make bacterial infxn more likely.  Start Zpack.  Reviewed supportive care and red flags that should prompt return.  Pt expressed understanding and is in  agreement w/ plan.    Annye Asa, MD 01/20/2022  Time spent with the patient: 8 minutes, of which >50% was spent in obtaining information about symptoms, reviewing previous labs, evaluations, and treatments, counseling about condition (please see the discussed topics above), and developing a plan to further investigate it; had a number of questions which I addressed.

## 2022-01-21 ENCOUNTER — Telehealth: Payer: Self-pay | Admitting: Family Medicine

## 2022-01-21 NOTE — Telephone Encounter (Signed)
Pt called in wanting to know if Dr. Birdie Riddle could send in the cough medication to [facility] on [address] in [place]. He had video visit yesterday with Tabori.

## 2022-01-22 MED ORDER — GUAIFENESIN-CODEINE 100-10 MG/5ML PO SYRP: 10.0000 mL | ORAL_SOLUTION | Freq: Three times a day (TID) | ORAL | 0 refills | Status: DC | PRN

## 2022-01-22 NOTE — Telephone Encounter (Signed)
Prescription sent to pharmacy as requested.

## 2022-01-25 ENCOUNTER — Encounter: Payer: Self-pay | Admitting: Family Medicine

## 2022-01-26 ENCOUNTER — Encounter: Payer: Self-pay | Admitting: Physician Assistant

## 2022-01-26 ENCOUNTER — Ambulatory Visit (INDEPENDENT_AMBULATORY_CARE_PROVIDER_SITE_OTHER): Payer: Medicare HMO | Admitting: Physician Assistant

## 2022-01-26 VITALS — BP 110/72 | HR 82 | Temp 98.4°F | Ht 74.0 in | Wt 204.0 lb

## 2022-01-26 DIAGNOSIS — R052 Subacute cough: Secondary | ICD-10-CM | POA: Diagnosis not present

## 2022-01-26 MED ORDER — PREDNISONE 20 MG PO TABS: 40.0000 mg | ORAL_TABLET | Freq: Every day | ORAL | 0 refills | Status: DC

## 2022-01-26 NOTE — Progress Notes (Signed)
Jerry Simpson is a 76 y.o. male here for a follow up of a pre-existing problem.  History of Present Illness:   Chief Complaint  Patient presents with   Sinus Problem    Pt c/o cough, congestion. Finishes Z-pak on Sunday. Denies fever or chills.    HPI  Sinus Problem Patient complain of cough that has been onset for the past 1 months. Associated with sinus drainage. He has had video visit with Dr, , Annye Asa on 01/20/2022. At that time, his symptoms were getting worse. He was prescribed Z-pack in that visit. He completed Z-pack about 3 days ago. Today, he states he has been dealing with nasal congestion which has been resulting in sore throat. He has been using Ricola dual action for cough. Feels like this has been working well. Has been having intermittent cough. He has been using Flonase as needed and Allegra 180 mg daily. No fever or chills. Denies sinus pressure or pain. No ear pain or pressure. No wheezing or difficulty breathing.   Does have hx of upper airway cough syndrome.  Past Medical History:  Diagnosis Date   Adenomatous polyps    Allergy    Borderline systolic HTN    BPH (benign prostatic hyperplasia)    elevated PSA, Dr Jeffie Pollock   CAD (coronary artery disease)    Cataract    removed both eyes    Environmental allergies    GERD (gastroesophageal reflux disease)    Hyperlipidemia      Social History   Tobacco Use   Smoking status: Never   Smokeless tobacco: Never  Vaping Use   Vaping Use: Never used  Substance Use Topics   Alcohol use: Yes    Alcohol/week: 7.0 standard drinks of alcohol    Types: 7 drink(s) per week    Comment:  < 7/ week   Drug use: No    Past Surgical History:  Procedure Laterality Date   ANGIOPLASTY  2004   stent placement ;sees Dr Ron Parker   CATARACT EXTRACTION, BILATERAL  2012   COLONOSCOPY     colonoscopy with polypectomy     X2, Dr Henrene Pastor   POLYPECTOMY     prostatic artery embolization     TONSILLECTOMY  1962    Family  History  Problem Relation Age of Onset   Hypertension Father    Heart attack Father 11   Diabetes Father    Cancer Brother        lung cancer ; leukemia terminally   Heart attack Brother 29   Heart disease Paternal Uncle 110   Stroke Maternal Grandmother        >65   Colon cancer Neg Hx    Stomach cancer Neg Hx    Colon polyps Neg Hx    Esophageal cancer Neg Hx    Rectal cancer Neg Hx     Allergies  Allergen Reactions   Sulfonamide Derivatives Rash    RASH  Because of a history of documented adverse serious drug reaction;Medi Alert bracelet  is recommended   Claritin [Loratadine]     Pt states it affects his prostate.     Current Medications:   Current Outpatient Medications:    ASPIRIN 81 PO, Take by mouth., Disp: , Rfl:    Coenzyme Q10 (CO Q-10) 100 MG CAPS, , Disp: , Rfl:    fexofenadine (ALLEGRA) 180 MG tablet, Take 180 mg by mouth daily as needed for allergies. , Disp: , Rfl:    finasteride (PROPECIA)  1 MG tablet, , Disp: , Rfl:    fluticasone (FLONASE) 50 MCG/ACT nasal spray, Place 2 sprays into both nostrils daily., Disp: 16 g, Rfl: 1   Multiple Vitamin (MULTIVITAMIN) tablet, Take 1 tablet by mouth daily., Disp: , Rfl:    rosuvastatin (CRESTOR) 10 MG tablet, Take 1 tablet (10 mg total) by mouth daily., Disp: 90 tablet, Rfl: 0   Review of Systems:   ROS Negative unless otherwise specified per HPI.   Vitals:   Vitals:   01/26/22 1522  BP: 110/72  Pulse: 82  Temp: 98.4 F (36.9 C)  TempSrc: Temporal  SpO2: 97%  Weight: 204 lb (92.5 kg)  Height: '6\' 2"'$  (1.88 m)     Body mass index is 26.19 kg/m.  Physical Exam:   Physical Exam Vitals and nursing note reviewed.  Constitutional:      General: He is not in acute distress.    Appearance: He is well-developed. He is not ill-appearing or toxic-appearing.  HENT:     Head: Normocephalic and atraumatic.     Right Ear: Tympanic membrane, ear canal and external ear normal. Tympanic membrane is not  erythematous, retracted or bulging.     Left Ear: Tympanic membrane, ear canal and external ear normal. Tympanic membrane is not erythematous, retracted or bulging.     Nose: Nose normal.     Right Sinus: No maxillary sinus tenderness or frontal sinus tenderness.     Left Sinus: No maxillary sinus tenderness or frontal sinus tenderness.     Mouth/Throat:     Pharynx: Uvula midline. No posterior oropharyngeal erythema.  Eyes:     General: Lids are normal.     Conjunctiva/sclera: Conjunctivae normal.  Neck:     Trachea: Trachea normal.  Cardiovascular:     Rate and Rhythm: Normal rate and regular rhythm.     Heart sounds: Normal heart sounds, S1 normal and S2 normal.  Pulmonary:     Effort: Pulmonary effort is normal.     Breath sounds: Normal breath sounds. No decreased breath sounds, wheezing, rhonchi or rales.  Lymphadenopathy:     Cervical: No cervical adenopathy.  Skin:    General: Skin is warm and dry.  Neurological:     Mental Status: He is alert.  Psychiatric:        Speech: Speech normal.        Behavior: Behavior normal. Behavior is cooperative.     Assessment and Plan:   Subacute cough No red flags Will try a round of oral prednisone to help reduce inflammation and help with cough Push fluids Try changing antihistamine Try using saline nasal spray prior to medicated nasal spray Follow-up with PCP if new/worsening  I,Savera Zaman,acting as a scribe for [employer], PA.,have documented all relevant documentation on the behalf of Inda Coke, PA,as directed by  Inda Coke, PA while in the presence of Inda Coke, [place].   I, Inda Coke, [place], have reviewed all documentation for this visit. The documentation on 01/26/22 for the exam, diagnosis, procedures, and orders are all accurate and complete.   Inda Coke, PA-C

## 2022-01-26 NOTE — Patient Instructions (Signed)
It was great to see you!  Trial prednisone  Mix up your antihistamine  Start a saline nasal spray prior to flonase  Follow-up with [facility] if persists or does not get better!

## 2022-01-27 ENCOUNTER — Ambulatory Visit: Payer: Medicare HMO | Admitting: Physician Assistant

## 2022-01-27 DIAGNOSIS — H43813 Vitreous degeneration, bilateral: Secondary | ICD-10-CM | POA: Diagnosis not present

## 2022-01-27 DIAGNOSIS — H524 Presbyopia: Secondary | ICD-10-CM | POA: Diagnosis not present

## 2022-01-27 DIAGNOSIS — H35372 Puckering of macula, left eye: Secondary | ICD-10-CM | POA: Diagnosis not present

## 2022-01-27 DIAGNOSIS — H52203 Unspecified astigmatism, bilateral: Secondary | ICD-10-CM | POA: Diagnosis not present

## 2022-02-01 ENCOUNTER — Telehealth: Payer: Self-pay | Admitting: Family Medicine

## 2022-02-01 NOTE — Telephone Encounter (Signed)
Caller name: Hartley (pt)  On DPR? :yes/no: Yes  Call back number: [phone]  Provider they see: Birdie Riddle  Reason for call: Ursula Beath on 01/26/22 for sinus infection. Was rx'd prednisone 20 mg.  Pt finished it yesterday and states that he is not really feeling any better. Still coughing as well. Pt wondering what else he can do.

## 2022-02-01 NOTE — Telephone Encounter (Signed)
Spoke w/ pt and advised he can try some OTC Mucinex  and Delsym, If no better by Friday to call and we can get him a virtual apt or see about bringing  him to see Dr Birdie Riddle

## 2022-03-24 ENCOUNTER — Other Ambulatory Visit: Payer: Self-pay

## 2022-03-24 DIAGNOSIS — E785 Hyperlipidemia, unspecified: Secondary | ICD-10-CM

## 2022-03-24 MED ORDER — ROSUVASTATIN CALCIUM 10 MG PO TABS: 10.0000 mg | ORAL_TABLET | Freq: Every day | ORAL | 1 refills | Status: DC

## 2022-08-11 ENCOUNTER — Ambulatory Visit (INDEPENDENT_AMBULATORY_CARE_PROVIDER_SITE_OTHER): Payer: Medicare HMO | Admitting: Family Medicine

## 2022-08-11 ENCOUNTER — Encounter: Payer: Self-pay | Admitting: Family Medicine

## 2022-08-11 VITALS — BP 138/72 | HR 72 | Temp 98.2°F | Resp 16 | Ht 74.0 in | Wt 209.5 lb

## 2022-08-11 DIAGNOSIS — Z125 Encounter for screening for malignant neoplasm of prostate: Secondary | ICD-10-CM

## 2022-08-11 DIAGNOSIS — R42 Dizziness and giddiness: Secondary | ICD-10-CM

## 2022-08-11 DIAGNOSIS — E785 Hyperlipidemia, unspecified: Secondary | ICD-10-CM

## 2022-08-11 LAB — CBC WITH DIFFERENTIAL/PLATELET
Basophils Absolute: 0.1 10*3/uL (ref 0.0–0.1)
Basophils Relative: 1 % (ref 0.0–3.0)
Eosinophils Absolute: 0.2 10*3/uL (ref 0.0–0.7)
Eosinophils Relative: 3.4 % (ref 0.0–5.0)
HCT: 38.9 % — ABNORMAL LOW (ref 39.0–52.0)
Hemoglobin: 13.1 g/dL (ref 13.0–17.0)
Lymphocytes Relative: 28.4 % (ref 12.0–46.0)
Lymphs Abs: 2.1 10*3/uL (ref 0.7–4.0)
MCHC: 33.7 g/dL (ref 30.0–36.0)
MCV: 81.7 fl (ref 78.0–100.0)
Monocytes Absolute: 0.7 10*3/uL (ref 0.1–1.0)
Monocytes Relative: 9 % (ref 3.0–12.0)
Neutro Abs: 4.2 10*3/uL (ref 1.4–7.7)
Neutrophils Relative %: 58.2 % (ref 43.0–77.0)
Platelets: 302 10*3/uL (ref 150.0–400.0)
RBC: 4.76 Mil/uL (ref 4.22–5.81)
RDW: 15 % (ref 11.5–15.5)
WBC: 7.3 10*3/uL (ref 4.0–10.5)

## 2022-08-11 LAB — PSA, MEDICARE: PSA: 1.02 ng/ml (ref 0.10–4.00)

## 2022-08-11 LAB — TSH: TSH: 1.61 u[IU]/mL (ref 0.35–5.50)

## 2022-08-11 NOTE — Patient Instructions (Addendum)
Follow up as needed or as scheduled We'll notify you of your lab results and make any changes if needed Make sure you are drinking LOTS of fluids Change positions slowly and allow yourself time to adjust Call with any questions or concerns Stay Safe!  Stay Healthy!!

## 2022-08-11 NOTE — Progress Notes (Signed)
   Subjective:    Patient ID: Jerry Simpson, male    DOB: 1945/12/29, 77 y.o.   MRN: [id number]  HPI Dizziness- pt reports 3 weeks ago he woke up to use the bathroom 'and I felt really drunk'.  Was 'staggering' to the bathroom and where symptoms resolved spontaneously.  Since then has had additional episodes and they seem to be occurring more frequently.  Sxs seem to be more prevalent in the morning.  No changes in diet, medication, supplements.  Describes sxs as 'feeling off'.  Pt reports good water intake.  Pt reports BP has been running 'a bit higher' recently.     Review of Systems For ROS see HPI     Objective:   Physical Exam Vitals reviewed.  Constitutional:      General: He is not in acute distress.    Appearance: Normal appearance. He is well-developed. He is not ill-appearing.  HENT:     Head: Normocephalic and atraumatic.  Eyes:     Extraocular Movements: Extraocular movements intact.     Conjunctiva/sclera: Conjunctivae normal.     Pupils: Pupils are equal, round, and reactive to light.  Neck:     Thyroid: No thyromegaly.  Cardiovascular:     Rate and Rhythm: Normal rate and regular rhythm.     Pulses: Normal pulses.     Heart sounds: Normal heart sounds. No murmur heard. Pulmonary:     Effort: Pulmonary effort is normal. No respiratory distress.     Breath sounds: Normal breath sounds.  Abdominal:     General: Bowel sounds are normal. There is no distension.     Palpations: Abdomen is soft.  Musculoskeletal:     Cervical back: Normal range of motion and neck supple.     Right lower leg: No edema.     Left lower leg: No edema.  Lymphadenopathy:     Cervical: No cervical adenopathy.  Skin:    General: Skin is warm and dry.  Neurological:     General: No focal deficit present.     Mental Status: He is alert and oriented to person, place, and time.     Cranial Nerves: No cranial nerve deficit.  Psychiatric:        Mood and Affect: Mood normal.         Behavior: Behavior normal.           Assessment & Plan:  Dizziness- new.  Sxs are not consistent w/ Vertigo.  Dizziness is more consistent w/ pre-syncope/vagal/orthostatic sxs.  He does report recently donating blood.  Encouraged increased water intake, changing positions slowly, and avoid staying in 1 certain position for extended periods of time w/ the exception of sleeping.  Will check labs to r/o underlying metabolic cause- electrolyte or thyroid disturbance, possible anemia- but w/ the exception of possible anemia from giving blood, I doubt we will find a secondary cause.  Reviewed supportive care and red flags that should prompt return.  Pt expressed understanding and is in agreement w/ plan.

## 2022-08-12 ENCOUNTER — Telehealth: Payer: Self-pay

## 2022-08-12 LAB — HEPATIC FUNCTION PANEL
ALT: 17 U/L (ref 0–53)
AST: 20 U/L (ref 0–37)
Albumin: 4.7 g/dL (ref 3.5–5.2)
Alkaline Phosphatase: 56 U/L (ref 39–117)
Bilirubin, Direct: 0.1 mg/dL (ref 0.0–0.3)
Total Bilirubin: 0.6 mg/dL (ref 0.2–1.2)
Total Protein: 7.7 g/dL (ref 6.0–8.3)

## 2022-08-12 LAB — LIPID PANEL
Cholesterol: 134 mg/dL (ref 0–200)
HDL: 30.8 mg/dL — ABNORMAL LOW (ref >39.00)
LDL Cholesterol: 63 mg/dL (ref 0–99)
NonHDL: 102.79
Total CHOL/HDL Ratio: 4
Triglycerides: 199 mg/dL — ABNORMAL HIGH (ref 0.0–149.0)
VLDL: 39.8 mg/dL (ref 0.0–40.0)

## 2022-08-12 LAB — BASIC METABOLIC PANEL
BUN: 20 mg/dL (ref 6–23)
CO2: 25 mEq/L (ref 19–32)
Calcium: 10.2 mg/dL (ref 8.4–10.5)
Chloride: 103 mEq/L (ref 96–112)
Creatinine, Ser: 1.03 mg/dL (ref 0.40–1.50)
GFR: 71.47 mL/min (ref >60.00)
Glucose, Bld: 88 mg/dL (ref 70–99)
Potassium: 4.3 mEq/L (ref 3.5–5.1)
Sodium: 140 mEq/L (ref 135–145)

## 2022-08-12 NOTE — Telephone Encounter (Signed)
Informed pt of lab results  

## 2022-08-12 NOTE — Telephone Encounter (Signed)
-----   Message from Midge Minium, MD sent at 08/12/2022 12:22 PM EST ----- Labs look great!  No changes at this time

## 2022-08-13 ENCOUNTER — Other Ambulatory Visit: Payer: Self-pay | Admitting: Family Medicine

## 2022-09-10 ENCOUNTER — Other Ambulatory Visit: Payer: Self-pay

## 2022-09-10 DIAGNOSIS — E785 Hyperlipidemia, unspecified: Secondary | ICD-10-CM

## 2022-09-10 MED ORDER — ROSUVASTATIN CALCIUM 10 MG PO TABS: 10.0000 mg | ORAL_TABLET | Freq: Every day | ORAL | 1 refills | Status: DC

## 2022-10-05 ENCOUNTER — Ambulatory Visit (INDEPENDENT_AMBULATORY_CARE_PROVIDER_SITE_OTHER): Payer: Medicare HMO | Admitting: Family Medicine

## 2022-10-05 ENCOUNTER — Encounter: Payer: Self-pay | Admitting: Family Medicine

## 2022-10-05 VITALS — BP 130/78 | HR 62 | Temp 97.8°F | Resp 16 | Ht 74.0 in | Wt 210.0 lb

## 2022-10-05 DIAGNOSIS — M222X1 Patellofemoral disorders, right knee: Secondary | ICD-10-CM | POA: Diagnosis not present

## 2022-10-05 NOTE — Progress Notes (Signed)
   Subjective:    Patient ID: Jerry Simpson, male    DOB: 1946-01-15, 77 y.o.   MRN: [id number]  HPI Knee pain- pt reports sxs started ~1 yr ago and he was unable to sit w/ feet on the floor (had to have legs crossed)  Pain is localized to R superior anterior knee- 'like a horseshoe'.  Pain improves w/ walking.  Worsens w/ stairs.  Has been using Voltaren gel and ibuprofen  TID w/ good relief.     Review of Systems For ROS see HPI     Objective:   Physical Exam Vitals reviewed.  Constitutional:      General: He is not in acute distress.    Appearance: Normal appearance. He is not ill-appearing.  HENT:     Head: Normocephalic and atraumatic.  Cardiovascular:     Rate and Rhythm: Regular rhythm.     Pulses: Normal pulses.  Pulmonary:     Effort: Pulmonary effort is normal. No respiratory distress.  Musculoskeletal:        General: No swelling, tenderness or deformity.     Comments: Discomfort when sitting w/ feet flat on the floor (knees flexed).  Prefers to sit w/ leg crossed ankle to knee  Skin:    General: Skin is warm and dry.     Findings: No bruising or erythema.  Neurological:     General: No focal deficit present.     Mental Status: He is alert and oriented to person, place, and time.     Cranial Nerves: No cranial nerve deficit.     Coordination: Coordination normal.     Gait: Gait normal.     Deep Tendon Reflexes: Reflexes normal.  Psychiatric:        Mood and Affect: Mood normal.        Behavior: Behavior normal.        Thought Content: Thought content normal.           Assessment & Plan:  Patellofemoral syndrome- new.  Pt's sxs are consistent w/ dx.  Reports sxs first started over a year ago but have been fairly consistent for the last 2 weeks.  Some relief w/ Voltaren Gel and Ibuprofen.  Encouraged ice, reviewed possibility of wearing a brace for alignment, and will refer to PT to develop a home exercise program.  Pt expressed understanding and is in  agreement w/ plan.

## 2022-10-05 NOTE — Patient Instructions (Signed)
Follow up as needed or as scheduled We'll call you to schedule your physical therapy Continue to use the Voltaren Gel and Ibuprofen for pain and inflammation ICE! Call with any questions or concerns Stay Safe!  Stay Healthy! Happy Spring!!!

## 2022-10-07 ENCOUNTER — Ambulatory Visit: Payer: Medicare HMO | Admitting: Sports Medicine

## 2022-10-07 ENCOUNTER — Ambulatory Visit (INDEPENDENT_AMBULATORY_CARE_PROVIDER_SITE_OTHER): Payer: Medicare HMO

## 2022-10-07 VITALS — BP 140/82 | HR 69 | Ht 74.0 in | Wt 210.0 lb

## 2022-10-07 DIAGNOSIS — M25561 Pain in right knee: Secondary | ICD-10-CM | POA: Diagnosis not present

## 2022-10-07 DIAGNOSIS — M222X1 Patellofemoral disorders, right knee: Secondary | ICD-10-CM

## 2022-10-07 DIAGNOSIS — G8929 Other chronic pain: Secondary | ICD-10-CM

## 2022-10-07 MED ORDER — MELOXICAM 15 MG PO TABS: 15.0000 mg | ORAL_TABLET | Freq: Every day | ORAL | 0 refills | Status: DC

## 2022-10-07 NOTE — Patient Instructions (Signed)
-   Start meloxicam 15 mg daily x2 weeks.  If still having pain after 2 weeks, complete 3rd-week of meloxicam. May use remaining meloxicam as needed once daily for pain control.  Do not to use additional NSAIDs while taking meloxicam.  May use Tylenol [phone] mg 2 to 3 times a day for breakthrough pain. Knee HEP  As needed follow up , if no improvement 3-4 weeks

## 2022-10-07 NOTE — Progress Notes (Signed)
Jerry Simpson D.[facility] [address] [place] [address] Phone: [phone]   Assessment and Plan:     1. Chronic pain of right knee 2. Patellofemoral pain syndrome of right knee -Chronic with exacerbation, initial sports medicine visit - Most consistent with patellofemoral syndrome primarily causing pain of lateral knee with mild lateral patellar spurring seen on x-ray and patient stressing lateral portion of knee when sitting crosslegged - X-ray obtained in clinic.  My interpretation: No acute fracture or dislocation.  Mild lateral patellar spurring - Start meloxicam 15 mg daily x2 weeks.  If still having pain after 2 weeks, complete 3rd-week of meloxicam. May use remaining meloxicam as needed once daily for pain control.  Do not to use additional NSAIDs while taking meloxicam.  May use Tylenol [phone] mg 2 to 3 times a day for breakthrough pain. - Start HEP for knee - Recommend to avoid sitting crosslegged to avoid stress of lateral knee.  Patient has new chair which allows him to sit with straight legs  Other orders - meloxicam (MOBIC) 15 MG tablet; Take 1 tablet (15 mg total) by mouth daily.    Pertinent previous records reviewed include none   Follow Up: As needed no improvement or worsening of symptoms in 3 to 4 weeks.  Could consider intra-articular CSI   Subjective:   I, Jerry Simpson, am serving as a Neurosurgeon for Doctor Richardean Sale  Chief Complaint: right knee pain   HPI:   10/07/22 Patient is a 77 year old male complaining of right knee pain. Patient states Knee pain- pt reports sxs started ~1 yr ago and he was unable to sit w/ feet on the floor (had to have legs crossed)  Pain is localized to R superior anterior knee- 'like a horseshoe' this pain has been increasing over the past two weeks.  Worsens w/ stairs.  Has been using Voltaren gel and ibuprofen  TID w/ good relief.  PFPS , no numbness or tingling, nothing  debilitating he states he is sore and tender, can walk just fine     Relevant Historical Information: GERD  Additional pertinent review of systems negative.   Current Outpatient Medications:    ASPIRIN 81 PO, Take by mouth., Disp: , Rfl:    Coenzyme Q10 (CO Q-10) 100 MG CAPS, , Disp: , Rfl:    fenofibrate 160 MG tablet, TAKE ONE TABLET BY MOUTH DAILY, Disp: 90 tablet, Rfl: 1   fexofenadine (ALLEGRA) 180 MG tablet, Take 180 mg by mouth daily as needed for allergies. , Disp: , Rfl:    finasteride (PROPECIA) 1 MG tablet, , Disp: , Rfl:    fluticasone (FLONASE) 50 MCG/ACT nasal spray, Place 2 sprays into both nostrils daily., Disp: 16 g, Rfl: 1   meloxicam (MOBIC) 15 MG tablet, Take 1 tablet (15 mg total) by mouth daily., Disp: 30 tablet, Rfl: 0   Multiple Vitamin (MULTIVITAMIN) tablet, Take 1 tablet by mouth daily., Disp: , Rfl:    rosuvastatin (CRESTOR) 10 MG tablet, Take 1 tablet (10 mg total) by mouth daily., Disp: 90 tablet, Rfl: 1   Objective:     Vitals:   10/07/22 1051  BP: (!) 140/82  Pulse: 69  SpO2: 94%  Weight: 210 lb (95.3 kg)  Height:  (1.88 m)      Body mass index is 26.96 kg/m.    Physical Exam:    General:  awake, alert oriented, no acute distress nontoxic Skin: no suspicious lesions  or rashes Neuro:sensation intact and strength 5/5 with no deficits, no atrophy, normal muscle tone Psych: No signs of anxiety, depression or other mood disorder  Right knee: No swelling No deformity Neg fluid wave, joint milking ROM Flex 110, Ext 0 NTTP over the quad tendon, medial fem condyle, lat fem condyle, patella, plica, patella tendon, tibial tuberostiy, fibular head, posterior fossa, pes anserine bursa, gerdy's tubercle, medial jt line, lateral jt line Neg anterior and posterior drawer Neg lachman Neg sag sign Negative varus stress Negative valgus stress Negative McMurray Positive Thessaly for lateral anterior knee pain Positive patellar grind  Gait normal     Electronically signed by:  Jerry Simpson [facility] 11:44 AM 10/07/22

## 2022-10-12 DIAGNOSIS — D225 Melanocytic nevi of trunk: Secondary | ICD-10-CM | POA: Diagnosis not present

## 2022-10-12 DIAGNOSIS — L821 Other seborrheic keratosis: Secondary | ICD-10-CM | POA: Diagnosis not present

## 2022-10-12 DIAGNOSIS — I781 Nevus, non-neoplastic: Secondary | ICD-10-CM | POA: Diagnosis not present

## 2022-10-12 DIAGNOSIS — L570 Actinic keratosis: Secondary | ICD-10-CM | POA: Diagnosis not present

## 2022-10-13 ENCOUNTER — Ambulatory Visit: Payer: Medicare HMO | Admitting: Sports Medicine

## 2022-10-13 VITALS — HR 92 | Ht 74.0 in | Wt 212.0 lb

## 2022-10-13 DIAGNOSIS — M25561 Pain in right knee: Secondary | ICD-10-CM | POA: Diagnosis not present

## 2022-10-13 DIAGNOSIS — M222X1 Patellofemoral disorders, right knee: Secondary | ICD-10-CM | POA: Diagnosis not present

## 2022-10-13 DIAGNOSIS — G8929 Other chronic pain: Secondary | ICD-10-CM

## 2022-10-13 NOTE — Patient Instructions (Addendum)
Good to see you   I recommend continue meloxicam 15 mg daily for an additional 2 weeks, after which, I recommend stopping daily use of meloxicam. After 2 weeks, would recommend starting Tylenol 500 to 1000 mg 2-3 times a day as needed for daily pain relief.  For breakthrough pain, may use remaining meloxicam 15 mg daily.  Would not recommend using more than 1 to 2 tablets of meloxicam per week long-term. If you have a breakthrough pain well at the beach, would recommend 1 to 2 days of rest

## 2022-10-13 NOTE — Progress Notes (Signed)
Jerry Simpson D.[facility] [address] [place] [address] Phone: [phone]   Assessment and Plan:     1. Chronic pain of right knee 2. Patellofemoral pain syndrome of right knee  Chronic with exacerbation, subsequent visit - Still most consistent with patellofemoral syndrome primarily causing lateral knee pain, stressed when patient sits crosslegged.  Patient had flare of pain over the past 2 to 3 days after originally receiving significant benefit from meloxicam, however flare pain has completely resolved over the past 12 hours with continued use of meloxicam.  Discussed that flares of pain may come and go, but I recommend continuing a complete course of meloxicam 15 mg daily for 3 weeks to decrease overall pain.  Afterwards discontinue meloxicam and use remainder as needed and instead use Tylenol 500 to 1000 mg 2-3 times a day for day-to-day pain relief.  Discussed that pain may flare while patient is at the beach due to walking on the sand, and if that should happen, would recommend taking a 1 to 2-day break. - No concerning signs or symptoms, so no imaging today.  Pertinent previous records reviewed include none   Follow Up: As needed   Subjective:   I, Jerry Simpson, am serving as a Neurosurgeon for Doctor Richardean Sale   Chief Complaint: right knee pain    HPI:    10/07/22 Patient is a 77 year old male complaining of right knee pain. Patient states Knee pain- pt reports sxs started ~1 yr ago and he was unable to sit w/ feet on the floor (had to have legs crossed)  Pain is localized to R superior anterior knee- 'like a horseshoe' this pain has been increasing over the past two weeks.  Worsens w/ stairs.  Has been using Voltaren gel and ibuprofen  TID w/ good relief.  PFPS , no numbness or tingling, nothing debilitating he states he is sore and tender, can walk just fine    10/13/2022 Patient states knee pain is intermittent      Relevant Historical Information: GERD Additional pertinent review of systems negative.   Current Outpatient Medications:    ASPIRIN 81 PO, Take by mouth., Disp: , Rfl:    Coenzyme Q10 (CO Q-10) 100 MG CAPS, , Disp: , Rfl:    fenofibrate 160 MG tablet, TAKE ONE TABLET BY MOUTH DAILY, Disp: 90 tablet, Rfl: 1   fexofenadine (ALLEGRA) 180 MG tablet, Take 180 mg by mouth daily as needed for allergies. , Disp: , Rfl:    finasteride (PROPECIA) 1 MG tablet, , Disp: , Rfl:    fluticasone (FLONASE) 50 MCG/ACT nasal spray, Place 2 sprays into both nostrils daily., Disp: 16 g, Rfl: 1   meloxicam (MOBIC) 15 MG tablet, Take 1 tablet (15 mg total) by mouth daily., Disp: 30 tablet, Rfl: 0   Multiple Vitamin (MULTIVITAMIN) tablet, Take 1 tablet by mouth daily., Disp: , Rfl:    rosuvastatin (CRESTOR) 10 MG tablet, Take 1 tablet (10 mg total) by mouth daily., Disp: 90 tablet, Rfl: 1   Objective:     Vitals:   10/13/22 1344  Pulse: 92  SpO2: 97%  Weight: 212 lb (96.2 kg)  Height:  (1.88 m)      Body mass index is 27.22 kg/m.    Physical Exam:    General:  awake, alert oriented, no acute distress nontoxic Skin: no suspicious lesions or rashes Neuro:sensation intact and strength 5/5 with no deficits, no atrophy, normal muscle tone  Psych: No signs of anxiety, depression or other mood disorder  Left knee: No swelling No deformity Neg fluid wave, joint milking ROM Flex 110, Ext 0 NTTP over the quad tendon, medial fem condyle, lat fem condyle, patella, plica, patella tendon, tibial tuberostiy, fibular head, posterior fossa, pes anserine bursa, gerdy's tubercle, medial jt line, lateral jt line Neg anterior and posterior drawer Neg lachman Neg sag sign Negative varus stress Negative valgus stress Negative McMurray Negative Thessaly  Gait normal    Electronically signed by:  Jerry Simpson [facility] 1:59 PM 10/13/22

## 2022-10-20 ENCOUNTER — Encounter: Payer: Self-pay | Admitting: Internal Medicine

## 2022-10-29 ENCOUNTER — Telehealth: Payer: Self-pay | Admitting: Internal Medicine

## 2022-10-29 ENCOUNTER — Encounter: Payer: Self-pay | Admitting: Internal Medicine

## 2022-10-29 NOTE — Telephone Encounter (Signed)
Spoke with pt and let him know if he has had polyps in the past he cannot do a cologuard test. Pt will need to have colon, please contact him to schedule colon.

## 2022-10-29 NOTE — Telephone Encounter (Signed)
Inbound call from patient, wanting to know if instead of a colonoscopy, it would be possible for him to do a Cologaurd. Patient states he is unsure if it is recommended since polyps have been found in previous colonoscopies. Patient would like to know Dr. Lamar Sprinkles recommendation in regards to how to proceed.

## 2022-11-20 IMAGING — DX DG CERVICAL SPINE 2 OR 3 VIEWS
3 series · 3 of 3 positions shown · non-contrast
Comparison: None Available.

CLINICAL DATA: Neck pain

EXAM:
CERVICAL SPINE - 2-3 VIEW

[c-spine lat]
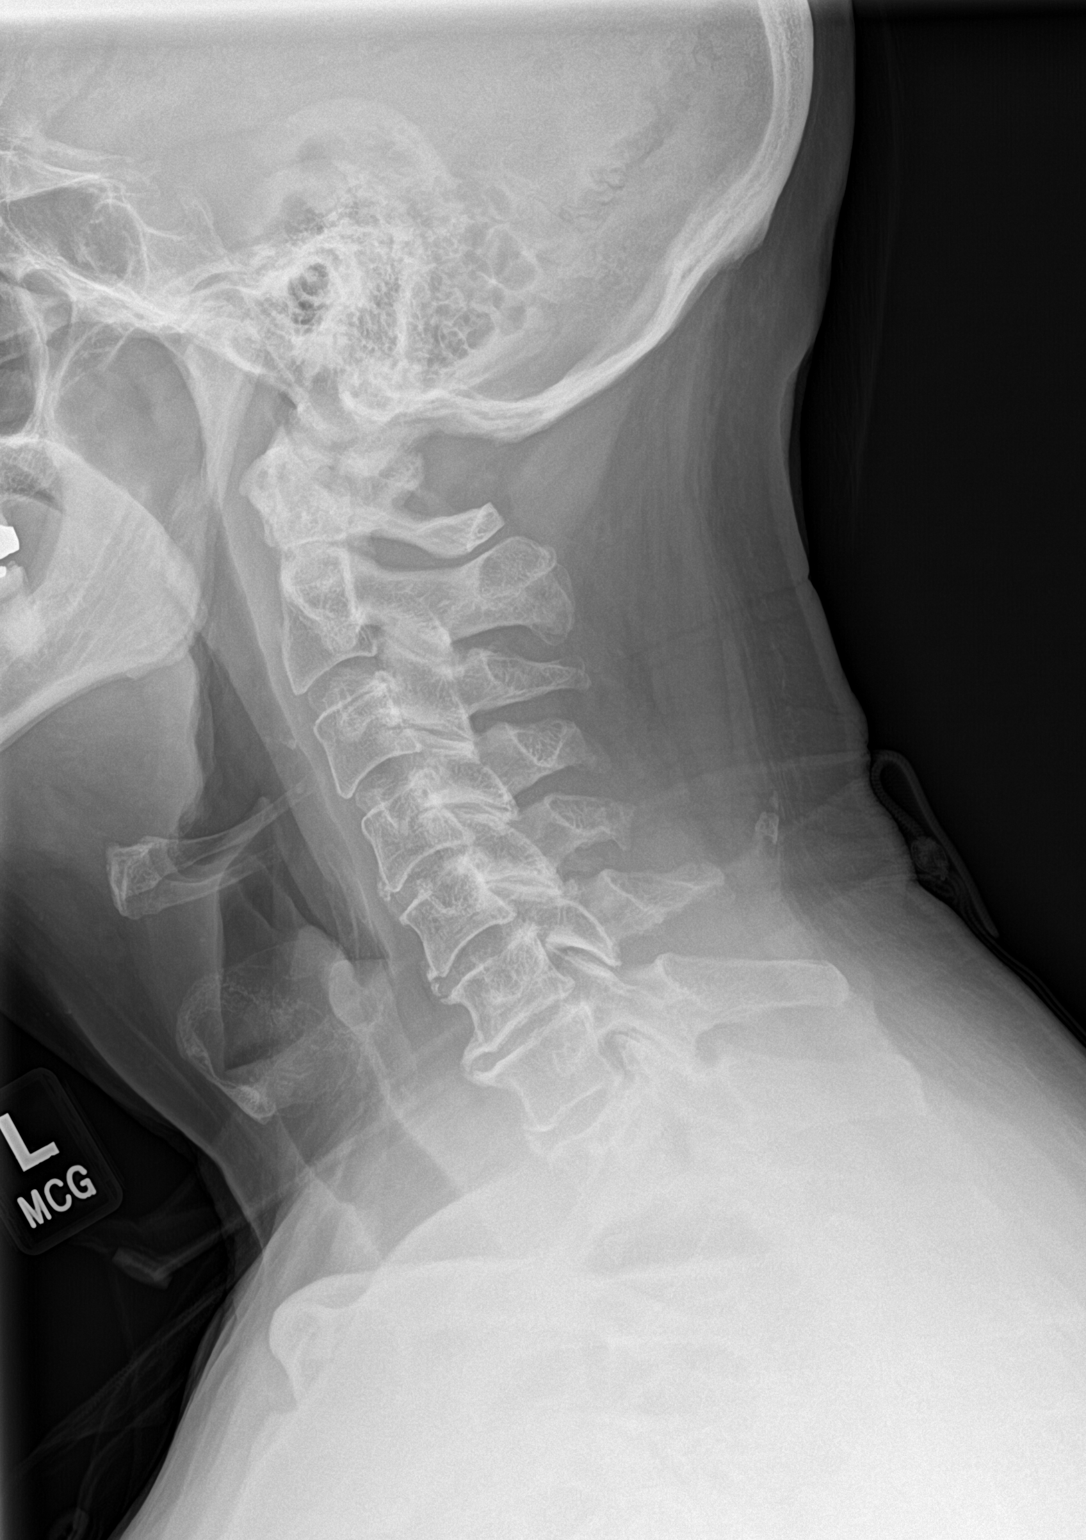

[c-spine ap]
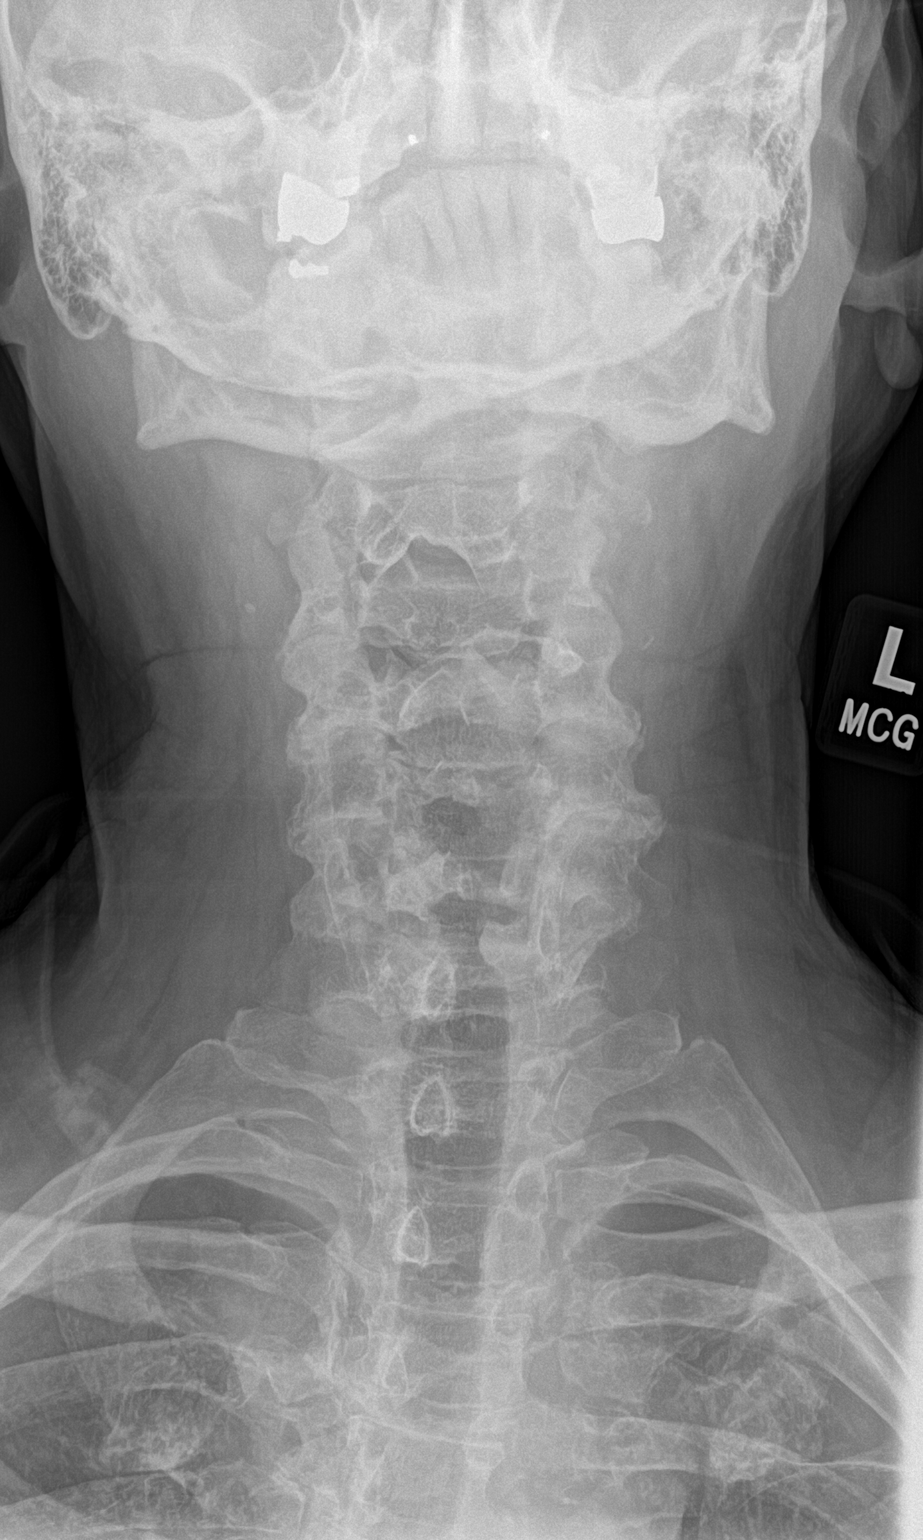

[c-spine open mouth]
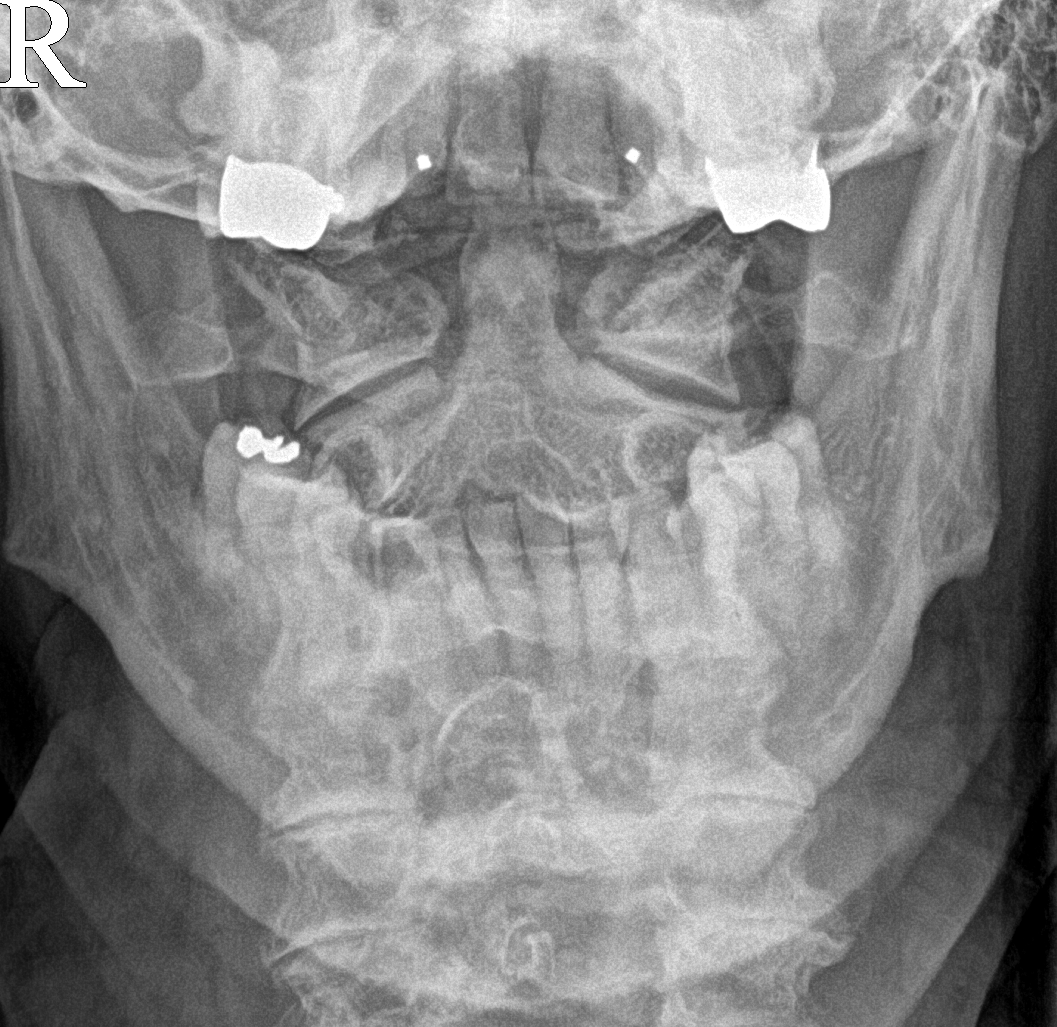

[3 of 3 positions shown; findings below may reference images not displayed]

FINDINGS: There is no evidence of cervical spine fracture or prevertebral soft
tissue swelling. Alignment is normal. Mild degenerative joint
changes with anterior osteophytosis of C4 through C7 are noted.
There is mild narrowing of the C 6-7 intervertebral space.
IMPRESSION: Mild degenerative joint changes.

## 2022-11-26 ENCOUNTER — Telehealth: Payer: Self-pay

## 2022-11-26 DIAGNOSIS — E785 Hyperlipidemia, unspecified: Secondary | ICD-10-CM

## 2022-11-26 DIAGNOSIS — Z125 Encounter for screening for malignant neoplasm of prostate: Secondary | ICD-10-CM

## 2022-11-26 NOTE — Telephone Encounter (Signed)
Pt is asking if he can get his blood work done before his annual on 11/01/22? He would like to add a PSA to labs as well . What labs would you like to order ?

## 2022-11-29 NOTE — Telephone Encounter (Signed)
Called pt and no vm set up

## 2022-11-29 NOTE — Telephone Encounter (Signed)
I have spoken to the pt and informed him that the lab orders have been placed

## 2022-11-29 NOTE — Telephone Encounter (Signed)
Labs ordered.  He can go to [place] at any time and have these done

## 2022-12-01 ENCOUNTER — Other Ambulatory Visit (INDEPENDENT_AMBULATORY_CARE_PROVIDER_SITE_OTHER): Payer: Medicare HMO

## 2022-12-01 DIAGNOSIS — Z125 Encounter for screening for malignant neoplasm of prostate: Secondary | ICD-10-CM

## 2022-12-01 DIAGNOSIS — E785 Hyperlipidemia, unspecified: Secondary | ICD-10-CM | POA: Diagnosis not present

## 2022-12-01 LAB — BASIC METABOLIC PANEL
BUN: 16 mg/dL (ref 6–23)
CO2: 28 mEq/L (ref 19–32)
Calcium: 9.3 mg/dL (ref 8.4–10.5)
Chloride: 103 mEq/L (ref 96–112)
Creatinine, Ser: 1.07 mg/dL (ref 0.40–1.50)
GFR: 68.13 mL/min (ref >60.00)
Glucose, Bld: 90 mg/dL (ref 70–99)
Potassium: 3.9 mEq/L (ref 3.5–5.1)
Sodium: 139 mEq/L (ref 135–145)

## 2022-12-01 LAB — HEPATIC FUNCTION PANEL
ALT: 21 U/L (ref 0–53)
AST: 22 U/L (ref 0–37)
Albumin: 4.5 g/dL (ref 3.5–5.2)
Alkaline Phosphatase: 35 U/L — ABNORMAL LOW (ref 39–117)
Bilirubin, Direct: 0.2 mg/dL (ref 0.0–0.3)
Total Bilirubin: 0.7 mg/dL (ref 0.2–1.2)
Total Protein: 7.3 g/dL (ref 6.0–8.3)

## 2022-12-01 LAB — CBC WITH DIFFERENTIAL/PLATELET
Basophils Absolute: 0.1 10*3/uL (ref 0.0–0.1)
Basophils Relative: 1.1 % (ref 0.0–3.0)
Eosinophils Absolute: 0.2 10*3/uL (ref 0.0–0.7)
Eosinophils Relative: 2.6 % (ref 0.0–5.0)
HCT: 44.4 % (ref 39.0–52.0)
Hemoglobin: 14.3 g/dL (ref 13.0–17.0)
Lymphocytes Relative: 36.2 % (ref 12.0–46.0)
Lymphs Abs: 2.8 10*3/uL (ref 0.7–4.0)
MCHC: 32.3 g/dL (ref 30.0–36.0)
MCV: 86.1 fl (ref 78.0–100.0)
Monocytes Absolute: 0.7 10*3/uL (ref 0.1–1.0)
Monocytes Relative: 8.9 % (ref 3.0–12.0)
Neutro Abs: 3.9 10*3/uL (ref 1.4–7.7)
Neutrophils Relative %: 51.2 % (ref 43.0–77.0)
Platelets: 250 10*3/uL (ref 150.0–400.0)
RBC: 5.15 Mil/uL (ref 4.22–5.81)
RDW: 15.7 % — ABNORMAL HIGH (ref 11.5–15.5)
WBC: 7.6 10*3/uL (ref 4.0–10.5)

## 2022-12-01 LAB — TSH: TSH: 1.51 u[IU]/mL (ref 0.35–5.50)

## 2022-12-01 LAB — LIPID PANEL
Cholesterol: 138 mg/dL (ref 0–200)
HDL: 31.3 mg/dL — ABNORMAL LOW (ref >39.00)
LDL Cholesterol: 69 mg/dL (ref 0–99)
NonHDL: 106.65
Total CHOL/HDL Ratio: 4
Triglycerides: 186 mg/dL — ABNORMAL HIGH (ref 0.0–149.0)
VLDL: 37.2 mg/dL (ref 0.0–40.0)

## 2022-12-01 LAB — PSA, MEDICARE: PSA: 1.07 ng/ml (ref 0.10–4.00)

## 2022-12-02 ENCOUNTER — Telehealth: Payer: Self-pay

## 2022-12-02 NOTE — Telephone Encounter (Signed)
-----   Message from Sheliah Hatch, MD sent at 12/02/2022  7:22 AM EDT ----- Labs look good!  No changes at this time

## 2022-12-07 ENCOUNTER — Encounter: Payer: Self-pay | Admitting: Family Medicine

## 2022-12-15 ENCOUNTER — Ambulatory Visit (INDEPENDENT_AMBULATORY_CARE_PROVIDER_SITE_OTHER): Payer: Medicare HMO | Admitting: *Deleted

## 2022-12-15 DIAGNOSIS — Z0000 Encounter for general adult medical examination without abnormal findings: Secondary | ICD-10-CM | POA: Diagnosis not present

## 2022-12-15 NOTE — Progress Notes (Signed)
Subjective:   Jerry Simpson is a 77 y.o. male who presents for Medicare Annual/Subsequent preventive examination.  Visit Complete: Virtual  I connected with  Jerry Simpson on 12/15/22 by a audio enabled telemedicine application and verified that I am speaking with the correct person using two identifiers.  Patient Location: Home  Provider Location: Home Office  I discussed the limitations of evaluation and management by telemedicine. The patient expressed understanding and agreed to proceed.    Review of Systems     Cardiac Risk Factors include: advanced age (>60men, >76 women);male gender;obesity (BMI >30kg/m2)     Objective:    Today's Vitals   There is no height or weight on file to calculate BMI.     12/15/2022   10:38 AM 11/25/2021   10:29 AM 07/28/2020    9:49 AM 12/29/2017   10:46 AM 10/11/2017    8:17 AM 10/04/2017    1:36 PM 12/15/2016    9:50 AM  Advanced Directives  Does Patient Have a Medical Advance Directive? Yes Yes Yes Yes Yes Yes Yes  Type of Estate agent of [employer] Power of [place];Living will Healthcare Power of [place];Living will Healthcare Power of [place];Living will   Healthcare Power of [place];Living will  Copy of Healthcare Power of Attorney in Chart? Yes - validated most recent copy scanned in chart (See row information) No - copy requested Yes - validated most recent copy scanned in chart (See row information) No - copy requested   No - copy requested    Current Medications (verified) Outpatient Encounter Medications as of 12/15/2022  Medication Sig   ASPIRIN 81 PO Take by mouth.   Coenzyme Q10 (CO Q-10) 100 MG CAPS    fenofibrate 160 MG tablet TAKE ONE TABLET BY MOUTH DAILY   fexofenadine (ALLEGRA) 180 MG tablet Take 180 mg by mouth daily as needed for allergies.    finasteride (PROPECIA) 1 MG tablet    fluticasone (FLONASE) 50 MCG/ACT nasal spray Place 2 sprays into both nostrils daily.   Multiple  Vitamin (MULTIVITAMIN) tablet Take 1 tablet by mouth daily.   rosuvastatin (CRESTOR) 10 MG tablet Take 1 tablet (10 mg total) by mouth daily.   meloxicam (MOBIC) 15 MG tablet Take 1 tablet (15 mg total) by mouth daily.   No facility-administered encounter medications on file as of 12/15/2022.    Allergies (verified) Sulfonamide derivatives and Claritin [loratadine]   History: Past Medical History:  Diagnosis Date   Adenomatous polyps    Allergy    Borderline systolic HTN    BPH (benign prostatic hyperplasia)    elevated PSA, Dr Annabell Howells   CAD (coronary artery disease)    Cataract    removed both eyes    Environmental allergies    GERD (gastroesophageal reflux disease)    Hyperlipidemia    Past Surgical History:  Procedure Laterality Date   ANGIOPLASTY  2004   stent placement ;sees Dr Myrtis Ser   CATARACT EXTRACTION, BILATERAL  2012   COLONOSCOPY     colonoscopy with polypectomy     X2, Dr Marina Goodell   POLYPECTOMY     prostatic artery embolization     TONSILLECTOMY  1962   Family History  Problem Relation Age of Onset   Hypertension Father    Heart attack Father 69   Diabetes Father    Cancer Brother        lung cancer ; leukemia terminally   Heart attack Brother 52   Heart disease  Paternal Uncle 57   Stroke Maternal Grandmother        >65   Colon cancer Neg Hx    Stomach cancer Neg Hx    Colon polyps Neg Hx    Esophageal cancer Neg Hx    Rectal cancer Neg Hx    Social History   Socioeconomic History   Marital status: Married    Spouse name: Not on file   Number of children: Not on file   Years of education: Not on file   Highest education level: Not on file  Occupational History   Not on file  Tobacco Use   Smoking status: Never   Smokeless tobacco: Never  Vaping Use   Vaping Use: Never used  Substance and Sexual Activity   Alcohol use: Yes    Alcohol/week: 7.0 standard drinks of alcohol    Types: 7 drink(s) per week    Comment:  < 7/ week   Drug use: No    Sexual activity: Not Currently  Other Topics Concern   Not on file  Social History Narrative   Lives with wife.     Social Determinants of Health   Financial Resource Strain: Low Risk  (12/15/2022)   Overall Financial Resource Strain (CARDIA)    Difficulty of Paying Living Expenses: Not hard at all  Food Insecurity: No Food Insecurity (12/15/2022)   Hunger Vital Sign    Worried About Running Out of Food in the Last Year: Never true    Ran Out of Food in the Last Year: Never true  Transportation Needs: No Transportation Needs (12/15/2022)   PRAPARE - Administrator, Civil Service (Medical): No    Lack of Transportation (Non-Medical): No  Physical Activity: Inactive (12/15/2022)   Exercise Vital Sign    Days of Exercise per Week: 0 days    Minutes of Exercise per Session: 0 min  Stress: No Stress Concern Present (12/15/2022)   [employer] of Occupational Health - Occupational Stress Questionnaire    Feeling of Stress : Not at all  Social Connections: Socially Integrated (12/15/2022)   Social Connection and Isolation Panel [NHANES]    Frequency of Communication with Friends and Family: More than three times a week    Frequency of Social Gatherings with Friends and Family: More than three times a week    Attends Religious Services: More than 4 times per year    Active Member of [employer] or Organizations: Yes    Attends Engineer, structural: More than 4 times per year    Marital Status: Married    Tobacco Counseling Counseling given: Not Answered   Clinical Intake:  Pre-visit preparation completed: Yes        Diabetes: No  How often do you need to have someone help you when you read instructions, pamphlets, or other written materials from your doctor or pharmacy?: 1 - Never  Interpreter Needed?: No  Information entered by :: Remi Haggard LPN   Activities of Daily Living    12/15/2022   10:40 AM 12/15/2022   10:38 AM  In your present state of  health, do you have any difficulty performing the following activities:  Hearing? 0 0  Vision? 0 0  Difficulty concentrating or making decisions? 0 0  Walking or climbing stairs? 0 0  Dressing or bathing? 0 0  Doing errands, shopping? 0 0  Preparing Food and eating ? N N  Using the Toilet? N N  In the past six months,  have you accidently leaked urine? N N  Do you have problems with loss of bowel control? N N  Managing your Medications? N N  Managing your Finances? N N  Housekeeping or managing your Housekeeping? N N    Patient Care Team: Sheliah Hatch, MD as PCP - General (Family Medicine) Hilarie Fredrickson, MD as Consulting Physician (Gastroenterology) Jerilee Field, MD as Referring Physician (General Surgery) [facility], Dory Peru, MD as Attending Physician Nita Sells, MD (Dermatology) Dahlia Byes, [employer] (Inactive) as Pharmacist (Pharmacist)  Indicate any recent Medical Services you may have received from other than [facility] providers in the past year (date may be approximate).     Assessment:   This is a routine wellness examination for [facility].  Hearing/Vision screen Hearing Screening - Comments:: No trouble hearing Vision Screening - Comments:: Up to date [facility]  Dietary issues and exercise activities discussed:     Goals Addressed             This Visit's Progress    Increase physical activity         Depression Screen    12/15/2022   10:43 AM 10/05/2022   10:50 AM 08/11/2022   11:05 AM 01/20/2022    9:38 AM 12/04/2021    3:03 PM 11/25/2021   10:30 AM 11/25/2021   10:28 AM  PHQ 2/9 Scores  PHQ - 2 Score 0 0 0 0 0 0 0  PHQ- 9 Score 0 0 1 0 1      Fall Risk    12/15/2022   10:37 AM 10/05/2022   10:50 AM 08/11/2022   11:05 AM 01/20/2022    9:38 AM 12/04/2021    3:04 PM  Fall Risk   Falls in the past year? 1 1 1  0 0  Number falls in past yr: 1 0 0 0 0  Injury with Fall? 0 0 0 0 0  Risk for fall due to :  History of fall(s) History of fall(s) No Fall Risks  No Fall Risks  Follow up Falls evaluation completed;Education provided;Falls prevention discussed Falls evaluation completed Falls evaluation completed Falls evaluation completed Falls evaluation completed    MEDICARE RISK AT HOME:  Medicare Risk at Home - 12/15/22 1036     Any stairs in or around the home? Yes    If so, are there any without handrails? No    Home free of loose throw rugs in walkways, pet beds, electrical cords, etc? Yes    Adequate lighting in your home to reduce risk of falls? Yes    Life alert? No    Use of a cane, walker or w/c? No    Grab bars in the bathroom? No    Shower chair or bench in shower? Yes    Elevated toilet seat or a handicapped toilet? No             TIMED UP AND GO:  Was the test performed?  No    Cognitive Function:        12/15/2022   10:39 AM  6CIT Screen  What Year? 0 points  What month? 0 points  What time? 0 points  Count back from 20 0 points  Months in reverse 0 points  Repeat phrase 0 points  Total Score 0 points    Immunizations Immunization History  Administered Date(s) Administered   Fluad Quad(high Dose 65+) 05/06/2020, 04/06/2021   Influenza Split 03/30/2011, 04/10/2012   Influenza Whole 04/25/2007, 04/03/2009, 06/05/2010   Influenza,  High Dose Seasonal PF 05/08/2013, 04/02/2015, 04/08/2016, 03/09/2019   Influenza,inj,Quad PF,6+ Mos 04/18/2014, 03/14/2017   Influenza-Unspecified 04/21/2022   Moderna Sars-Covid-2 Vaccination 08/02/2019, 08/31/2019   Pneumococcal Conjugate-13 07/04/2013   Pneumococcal Polysaccharide-23 07/09/2014   Tdap 06/30/2012   Zoster, Live 03/30/2011    TDAP status: Due, Education has been provided regarding the importance of this vaccine. Advised may receive this vaccine at local pharmacy or Health Dept. Aware to provide a copy of the vaccination record if obtained from local pharmacy or Health Dept. Verbalized acceptance and understanding.  Flu Vaccine status: Up to  date  Pneumococcal vaccine status: Up to date  Covid-19 vaccine status: Information provided on how to obtain vaccines.   Qualifies for Shingles Vaccine? Yes   Zostavax completed No   Shingrix Completed?: No.    Education has been provided regarding the importance of this vaccine. Patient has been advised to call insurance company to determine out of pocket expense if they have not yet received this vaccine. Advised may also receive vaccine at local pharmacy or Health Dept. Verbalized acceptance and understanding.  Screening Tests Health Maintenance  Topic Date Due   Colonoscopy  10/12/2022   INFLUENZA VACCINE  01/20/2023   Medicare Annual Wellness (AWV)  12/15/2023   Pneumonia Vaccine 64+ Years old  Completed   HPV VACCINES  Aged Out   DTaP/Tdap/Td  Discontinued   COVID-19 Vaccine  Discontinued   Hepatitis C Screening  Discontinued   Zoster Vaccines- Shingrix  Discontinued    Health Maintenance  Health Maintenance Due  Topic Date Due   Colonoscopy  10/12/2022    Colonoscopy scheduled  Lung Cancer Screening: (Low Dose CT Chest recommended if Age 17-80 years, 20 pack-year currently smoking OR have quit w/in 15years.) does not qualify.   Lung Cancer Screening Referral:   Additional Screening:  Hepatitis C Screening   never done  Vision Screening: Recommended annual ophthalmology exams for early detection of glaucoma and other disorders of the eye. Is the patient up to date with their annual eye exam?  Yes  Who is the provider or what is the name of the office in which the patient attends annual eye exams? [facility] If pt is not established with a provider, would they like to be referred to a provider to establish care? No .   Dental Screening: Recommended annual dental exams for proper oral hygiene    Community Resource Referral / Chronic Care Management: CRR required this visit?  No   CCM required this visit?  No     Plan:     I have personally reviewed and  noted the following in the patient's chart:   Medical and social history Use of alcohol, tobacco or illicit drugs  Current medications and supplements including opioid prescriptions. Patient is not currently taking opioid prescriptions. Functional ability and status Nutritional status Physical activity Advanced directives List of other physicians Hospitalizations, surgeries, and ER visits in previous 12 months Vitals Screenings to include cognitive, depression, and falls Referrals and appointments  In addition, I have reviewed and discussed with patient certain preventive protocols, quality metrics, and best practice recommendations. A written personalized care plan for preventive services as well as general preventive health recommendations were provided to patient.     Remi Haggard, LPN   09/17/5186   After Visit Summary: (MyChart) Due to this being a telephonic visit, the after visit summary with patients personalized plan was offered to patient via MyChart   Nurse Notes:

## 2022-12-15 NOTE — Patient Instructions (Signed)
Jerry Simpson , Thank you for taking time to come for your Medicare Wellness Visit. I appreciate your ongoing commitment to your health goals. Please review the following plan we discussed and let me know if I can assist you in the future.   Screening recommendations/referrals: Colonoscopy: scheduled Recommended yearly ophthalmology/optometry visit for glaucoma screening and checkup Recommended yearly dental visit for hygiene and checkup  Vaccinations: Influenza vaccine: up to date Pneumococcal vaccine: up to date Tdap vaccine: Education provided Shingles vaccine: 1 of 2    Advanced directives: yes on file   Preventive Care 65 Years and Older, Male Preventive care refers to lifestyle choices and visits with your health care provider that can promote health and wellness. What does preventive care include? A yearly physical exam. This is also called an annual well check. Dental exams once or twice a year. Routine eye exams. Ask your health care provider how often you should have your eyes checked. Personal lifestyle choices, including: Daily care of your teeth and gums. Regular physical activity. Eating a healthy diet. Avoiding tobacco and drug use. Limiting alcohol use. Practicing safe sex. Taking low doses of aspirin every day. Taking vitamin and mineral supplements as recommended by your health care provider. What happens during an annual well check? The services and screenings done by your health care provider during your annual well check will depend on your age, overall health, lifestyle risk factors, and family history of disease. Counseling  Your health care provider may ask you questions about your: Alcohol use. Tobacco use. Drug use. Emotional well-being. Home and relationship well-being. Sexual activity. Eating habits. History of falls. Memory and ability to understand (cognition). Work and work Astronomer. Screening  You may have the following tests or  measurements: Height, weight, and BMI. Blood pressure. Lipid and cholesterol levels. These may be checked every 5 years, or more frequently if you are over 51 years old. Skin check. Lung cancer screening. You may have this screening every year starting at age 60 if you have a 30-pack-year history of smoking and currently smoke or have quit within the past 15 years. Fecal occult blood test (FOBT) of the stool. You may have this test every year starting at age 33. Flexible sigmoidoscopy or colonoscopy. You may have a sigmoidoscopy every 5 years or a colonoscopy every 10 years starting at age 75. Prostate cancer screening. Recommendations will vary depending on your family history and other risks. Hepatitis C blood test. Hepatitis B blood test. Sexually transmitted disease (STD) testing. Diabetes screening. This is done by checking your blood sugar (glucose) after you have not eaten for a while (fasting). You may have this done every 1-3 years. Abdominal aortic aneurysm (AAA) screening. You may need this if you are a current or former smoker. Osteoporosis. You may be screened starting at age 26 if you are at high risk. Talk with your health care provider about your test results, treatment options, and if necessary, the need for more tests. Vaccines  Your health care provider may recommend certain vaccines, such as: Influenza vaccine. This is recommended every year. Tetanus, diphtheria, and acellular pertussis (Tdap, Td) vaccine. You may need a Td booster every 10 years. Zoster vaccine. You may need this after age 86. Pneumococcal 13-valent conjugate (PCV13) vaccine. One dose is recommended after age 46. Pneumococcal polysaccharide (PPSV23) vaccine. One dose is recommended after age 56. Talk to your health care provider about which screenings and vaccines you need and how often you need them. This information is  not intended to replace advice given to you by your health care provider. Make sure  you discuss any questions you have with your health care provider. Document Released: 07/04/2015 Document Revised: 02/25/2016 Document Reviewed: 04/08/2015 Elsevier Interactive Patient Education  2017 [place] Prevention in the Home Falls can cause injuries. They can happen to people of all ages. There are many things you can do to make your home safe and to help prevent falls. What can I do on the outside of my home? Regularly fix the edges of walkways and driveways and fix any cracks. Remove anything that might make you trip as you walk through a door, such as a raised step or threshold. Trim any bushes or trees on the path to your home. Use bright outdoor lighting. Clear any walking paths of anything that might make someone trip, such as rocks or tools. Regularly check to see if handrails are loose or broken. Make sure that both sides of any steps have handrails. Any raised decks and porches should have guardrails on the edges. Have any leaves, snow, or ice cleared regularly. Use sand or salt on walking paths during winter. Clean up any spills in your garage right away. This includes oil or grease spills. What can I do in the bathroom? Use night lights. Install grab bars by the toilet and in the tub and shower. Do not use towel bars as grab bars. Use non-skid mats or decals in the tub or shower. If you need to sit down in the shower, use a plastic, non-slip stool. Keep the floor dry. Clean up any water that spills on the floor as soon as it happens. Remove soap buildup in the tub or shower regularly. Attach bath mats securely with double-sided non-slip rug tape. Do not have throw rugs and other things on the floor that can make you trip. What can I do in the bedroom? Use night lights. Make sure that you have a light by your bed that is easy to reach. Do not use any sheets or blankets that are too big for your bed. They should not hang down onto the floor. Have a firm  chair that has side arms. You can use this for support while you get dressed. Do not have throw rugs and other things on the floor that can make you trip. What can I do in the kitchen? Clean up any spills right away. Avoid walking on wet floors. Keep items that you use a lot in easy-to-reach places. If you need to reach something above you, use a strong step stool that has a grab bar. Keep electrical cords out of the way. Do not use floor polish or wax that makes floors slippery. If you must use wax, use non-skid floor wax. Do not have throw rugs and other things on the floor that can make you trip. What can I do with my stairs? Do not leave any items on the stairs. Make sure that there are handrails on both sides of the stairs and use them. Fix handrails that are broken or loose. Make sure that handrails are as long as the stairways. Check any carpeting to make sure that it is firmly attached to the stairs. Fix any carpet that is loose or worn. Avoid having throw rugs at the top or bottom of the stairs. If you do have throw rugs, attach them to the floor with carpet tape. Make sure that you have a light switch at the top of the  stairs and the bottom of the stairs. If you do not have them, ask someone to add them for you. What else can I do to help prevent falls? Wear shoes that: Do not have high heels. Have rubber bottoms. Are comfortable and fit you well. Are closed at the toe. Do not wear sandals. If you use a stepladder: Make sure that it is fully opened. Do not climb a closed stepladder. Make sure that both sides of the stepladder are locked into place. Ask someone to hold it for you, if possible. Clearly mark and make sure that you can see: Any grab bars or handrails. First and last steps. Where the edge of each step is. Use tools that help you move around (mobility aids) if they are needed. These include: Canes. Walkers. Scooters. Crutches. Turn on the lights when you go  into a dark area. Replace any light bulbs as soon as they burn out. Set up your furniture so you have a clear path. Avoid moving your furniture around. If any of your floors are uneven, fix them. If there are any pets around you, be aware of where they are. Review your medicines with your doctor. Some medicines can make you feel dizzy. This can increase your chance of falling. Ask your doctor what other things that you can do to help prevent falls. This information is not intended to replace advice given to you by your health care provider. Make sure you discuss any questions you have with your health care provider. Document Released: 04/03/2009 Document Revised: 11/13/2015 Document Reviewed: 07/12/2014 Elsevier Interactive Patient Education  2017 [employer].

## 2022-12-22 ENCOUNTER — Ambulatory Visit (AMBULATORY_SURGERY_CENTER): Payer: Medicare HMO

## 2022-12-22 ENCOUNTER — Encounter: Payer: Self-pay | Admitting: Internal Medicine

## 2022-12-22 VITALS — Ht 74.0 in | Wt 209.0 lb

## 2022-12-22 DIAGNOSIS — Z86010 Personal history of colonic polyps: Secondary | ICD-10-CM

## 2022-12-22 MED ORDER — NA SULFATE-K SULFATE-MG SULF 17.5-3.13-1.6 GM/177ML PO SOLN: 1.0000 | Freq: Once | ORAL | 0 refills | Status: AC

## 2022-12-22 NOTE — Progress Notes (Signed)
Pre visit completed via phone call; Patient verified name, DOB, and address;  No egg or soy allergy known to patient;  No issues known to pt with past sedation with any surgeries or procedures; Patient denies ever being told they had issues or difficulty with intubation; No FH of Malignant Hyperthermia; Pt is not on diet pills; Pt is not on home 02;  Pt is not on blood thinners;  Pt denies issues with constipation;  No A fib or A flutter; Have any cardiac testing pending--NO Pt instructed to use Singlecare.com or GoodRx for a price reduction on prep;   Insurance verified during PV appt=[employer] Medicare  Patient's chart reviewed by Cathlyn Parsons CNRA prior to previsit and patient appropriate for the [facility].  Previsit completed and red dot placed by patient's name on their procedure day (on provider's schedule).    Instructions printed and given to patient at time of PV appt; GoodRx coupon also printed and given to patient at [facility];

## 2023-01-04 ENCOUNTER — Other Ambulatory Visit: Payer: Self-pay

## 2023-01-04 MED ORDER — FENOFIBRATE 160 MG PO TABS: 160.0000 mg | ORAL_TABLET | Freq: Every day | ORAL | 1 refills | Status: DC

## 2023-01-05 ENCOUNTER — Ambulatory Visit (INDEPENDENT_AMBULATORY_CARE_PROVIDER_SITE_OTHER): Payer: Medicare HMO | Admitting: Family Medicine

## 2023-01-05 ENCOUNTER — Encounter: Payer: Self-pay | Admitting: Family Medicine

## 2023-01-05 ENCOUNTER — Other Ambulatory Visit: Payer: Self-pay

## 2023-01-05 VITALS — BP 130/80 | HR 71 | Temp 97.8°F | Resp 18 | Ht 74.0 in | Wt 209.4 lb

## 2023-01-05 DIAGNOSIS — Z0000 Encounter for general adult medical examination without abnormal findings: Secondary | ICD-10-CM

## 2023-01-05 DIAGNOSIS — E785 Hyperlipidemia, unspecified: Secondary | ICD-10-CM

## 2023-01-05 MED ORDER — FENOFIBRATE 160 MG PO TABS
160.0000 mg | ORAL_TABLET | Freq: Every day | ORAL | 1 refills | Status: DC
Start: 2023-01-05 — End: 2024-03-26

## 2023-01-05 NOTE — Patient Instructions (Signed)
Follow up in 6 months to recheck BP and cholesterol Your labs looked great!  No changes at this time Continue to work on healthy diet and regular exercise- you can do it! Call with any questions or concerns Stay Safe!  Stay Healthy! Have a great summer!!

## 2023-01-05 NOTE — Progress Notes (Signed)
   Subjective:    Patient ID: Jerry Simpson, male    DOB: Dec 29, 1945, 77 y.o.   MRN: [id number]  HPI CPE- due for colonoscopy (scheduled).  UTD on PNA.  Did labs prior to visit.  Patient Care Team    Relationship Specialty Notifications Start End  Sheliah Hatch, MD PCP - General Family Medicine  04/18/14    Comment: Katherene Ponto, Wilhemina Bonito, MD Consulting Physician Gastroenterology  07/09/14   Jerilee Field, MD Referring Physician General Surgery  08/18/15    Comment: Urology  [facility] [place], MD Attending Physician   12/29/17    Comment: Tollie Pizza, Jonny Ruiz, MD  Dermatology  12/29/17   Dahlia Byes, [facility] (Inactive) Pharmacist Pharmacist  10/12/19    Comment: PHONE NUMBER [phone]     Health Maintenance  Topic Date Due   Colonoscopy  10/12/2022   INFLUENZA VACCINE  01/20/2023   Medicare Annual Wellness (AWV)  12/15/2023   Pneumonia Vaccine 65+ Years old  Completed   HPV VACCINES  Aged Out   DTaP/Tdap/Td  Discontinued   COVID-19 Vaccine  Discontinued   Hepatitis C Screening  Discontinued   Zoster Vaccines- Shingrix  Discontinued      Review of Systems Patient reports no vision/hearing changes, anorexia, fever ,adenopathy, persistant/recurrent hoarseness, swallowing issues, chest pain, palpitations, edema, persistant/recurrent cough, hemoptysis, dyspnea (rest,exertional, paroxysmal nocturnal), gastrointestinal  bleeding (melena, rectal bleeding), abdominal pain, excessive heart burn, GU symptoms (dysuria, hematuria, voiding/incontinence issues) syncope, focal weakness, memory loss, numbness & tingling, skin/hair/nail changes, depression, anxiety, abnormal bruising/bleeding, musculoskeletal symptoms/signs.     Objective:   Physical Exam General Appearance:    Alert, cooperative, no distress, appears stated age  Head:    Normocephalic, without obvious abnormality, atraumatic  Eyes:    PERRL, conjunctiva/corneas clear, EOM's intact both eyes       Ears:    Normal  TM's and external ear canals, both ears  Nose:   Nares normal, septum midline, mucosa normal, no drainage   or sinus tenderness  Throat:   Lips, mucosa, and tongue normal; teeth and gums normal  Neck:   Supple, symmetrical, trachea midline, no adenopathy;       thyroid:  No enlargement/tenderness/nodules  Back:     Symmetric, no curvature, ROM normal, no CVA tenderness  Lungs:     Clear to auscultation bilaterally, respirations unlabored  Chest wall:    No tenderness or deformity  Heart:    Regular rate and rhythm, S1 and S2 normal, no murmur, rub   or gallop  Abdomen:     Soft, non-tender, bowel sounds active all four quadrants,    no masses, no organomegaly  Genitalia:    Normal male without lesion, masses,discharge or tenderness  Rectal:    Deferred due to young age  Extremities:   Extremities normal, atraumatic, no cyanosis or edema  Pulses:   2+ and symmetric all extremities  Skin:   Skin color, texture, turgor normal, no rashes or lesions  Lymph nodes:   Cervical, supraclavicular, and axillary nodes normal  Neurologic:   CNII-XII intact. Normal strength, sensation and reflexes      throughout          Assessment & Plan:

## 2023-01-05 NOTE — Assessment & Plan Note (Signed)
Pt's PE WNL.  UTD on immunizations.  Colonoscopy is scheduled for later this month.  Reviewed recent labs- no need to repeat.  Anticipatory guidance provided.

## 2023-01-17 ENCOUNTER — Telehealth: Payer: Self-pay | Admitting: Internal Medicine

## 2023-01-17 DIAGNOSIS — M545 Low back pain, unspecified: Secondary | ICD-10-CM | POA: Diagnosis not present

## 2023-01-17 NOTE — Telephone Encounter (Signed)
Returned the patient's phone call. Instructed him to avoid fiber today and push the fluids. Pt verbalized understanding.

## 2023-01-17 NOTE — Telephone Encounter (Signed)
Patient called in  wanting to speak with a nurse ,he is having a procedure in 7/31 but he stated he is been eating vegetables.Please advise

## 2023-01-18 DIAGNOSIS — M65312 Trigger thumb, left thumb: Secondary | ICD-10-CM | POA: Diagnosis not present

## 2023-01-19 ENCOUNTER — Ambulatory Visit (AMBULATORY_SURGERY_CENTER): Payer: Medicare HMO | Admitting: Internal Medicine

## 2023-01-19 ENCOUNTER — Encounter: Payer: Self-pay | Admitting: Internal Medicine

## 2023-01-19 VITALS — BP 133/69 | HR 60 | Temp 98.2°F | Resp 15 | Ht 74.0 in | Wt 209.0 lb

## 2023-01-19 DIAGNOSIS — D122 Benign neoplasm of ascending colon: Secondary | ICD-10-CM | POA: Diagnosis not present

## 2023-01-19 DIAGNOSIS — I2510 Atherosclerotic heart disease of native coronary artery without angina pectoris: Secondary | ICD-10-CM | POA: Diagnosis not present

## 2023-01-19 DIAGNOSIS — Z09 Encounter for follow-up examination after completed treatment for conditions other than malignant neoplasm: Secondary | ICD-10-CM

## 2023-01-19 DIAGNOSIS — Z86010 Personal history of colonic polyps: Secondary | ICD-10-CM | POA: Diagnosis not present

## 2023-01-19 DIAGNOSIS — E669 Obesity, unspecified: Secondary | ICD-10-CM | POA: Diagnosis not present

## 2023-01-19 DIAGNOSIS — D125 Benign neoplasm of sigmoid colon: Secondary | ICD-10-CM

## 2023-01-19 MED ORDER — SODIUM CHLORIDE 0.9 % IV SOLN
500.0000 mL | INTRAVENOUS | Status: DC
Start: 2023-01-19 — End: 2023-01-19

## 2023-01-19 NOTE — Progress Notes (Signed)
HISTORY OF PRESENT ILLNESS:  Jerry Simpson is a 77 y.o. male with a history of multiple adenomatous polyps.  He presents today for surveillance colonoscopy.  No complaints  REVIEW OF SYSTEMS:  All non-GI ROS negative except for  Past Medical History:  Diagnosis Date   Adenomatous polyps    Arthritis    generalized- spine   Borderline systolic HTN    BPH (benign prostatic hyperplasia)    elevated PSA, Dr Annabell Howells   CAD (coronary artery disease)    Cataract    bilateral sx   Environmental allergies    GERD (gastroesophageal reflux disease)    NOT on meds/hx of   Hyperlipidemia    on meds   Seasonal allergies    PRN meds    Past Surgical History:  Procedure Laterality Date   CATARACT EXTRACTION, BILATERAL  2012   COLONOSCOPY  2019   JP-MAC-suprep(exc)-tics/TA x 2 frag-5 yr recall   CORONARY ANGIOPLASTY WITH STENT PLACEMENT  2004   Dr. Myrtis Ser   POLYPECTOMY  2019   TA x 2 frags   prostatic artery embolization     TONSILLECTOMY  1962    Social History Jerry Simpson  reports that he has never smoked. He has never used smokeless tobacco. He reports current alcohol use of about 3.0 standard drinks of alcohol per week. He reports that he does not use drugs.  family history includes Diabetes in his father; Heart attack (age of onset: 27) in his brother; Heart attack (age of onset: 42) in his father; Heart disease (age of onset: 42) in his paternal uncle; Hypertension in his father; Lung cancer in his brother; Stroke in his maternal grandmother.  Allergies  Allergen Reactions   Sulfonamide Derivatives Rash    RASH  Because of a history of documented adverse serious drug reaction;Medi Alert bracelet  is recommended   Claritin [Loratadine]     Pt states it affects his prostate.    Minoxidil Hives and Rash   Sulfa Antibiotics Rash       PHYSICAL EXAMINATION: Vital signs: BP (!) 153/73   Pulse 67   Temp 98.2 F (36.8 C) (Temporal)   Resp 11   Ht 6\' 2"  (1.88 m)   Wt  209 lb (94.8 kg)   SpO2 98%   BMI 26.83 kg/m  General: Well-developed, well-nourished, no acute distress HEENT: Sclerae are anicteric, conjunctiva pink. Oral mucosa intact Lungs: Clear Heart: Regular Abdomen: soft, nontender, nondistended, no obvious ascites, no peritoneal signs, normal bowel sounds. No organomegaly. Extremities: No edema Psychiatric: alert and oriented x3. Cooperative     ASSESSMENT:  Personal history of multiple adenomatous polyps   PLAN:   Surveillance colonoscopy

## 2023-01-19 NOTE — Progress Notes (Signed)
Report to PACU, RN, vss, BBS= Clear.  

## 2023-01-19 NOTE — Op Note (Signed)
[facility] Patient Name: Jerry Simpson Procedure Date: 01/19/2023 9:57 AM MRN: [id number] Endoscopist: Wilhemina Bonito. Marina Goodell , MD, [id number] Age: 77 Referring MD:  Date of Birth: [birth year] Gender: Male Account #: [id number] Procedure:                Colonoscopy with cold snare polypectomy x 2 Indications:              High risk colon cancer surveillance: Personal                            history of multiple (3 or more) adenomas. Previous                            examinations 2005, 2013, 2019 Medicines:                Monitored Anesthesia Care Procedure:                Pre-Anesthesia Assessment:                           - Prior to the procedure, a History and Physical                            was performed, and patient medications and                            allergies were reviewed. The patient's tolerance of                            previous anesthesia was also reviewed. The risks                            and benefits of the procedure and the sedation                            options and risks were discussed with the patient.                            All questions were answered, and informed consent                            was obtained. Prior Anticoagulants: The patient has                            taken no anticoagulant or antiplatelet agents. ASA                            Grade Assessment: II - A patient with mild systemic                            disease. After reviewing the risks and benefits,                            the patient was deemed in satisfactory condition to  undergo the procedure.                           After obtaining informed consent, the colonoscope                            was passed under direct vision. Throughout the                            procedure, the patient's blood pressure, pulse, and                            oxygen saturations were monitored continuously. The                             CF [id number] #[id number] was introduced through the anus                            and advanced to the the cecum, identified by                            appendiceal orifice and ileocecal valve. The                            ileocecal valve, appendiceal orifice, and rectum                            were photographed. The quality of the bowel                            preparation was excellent. The colonoscopy was                            performed without difficulty. The patient tolerated                            the procedure well. The bowel preparation used was                            SUPREP via split dose instruction. Scope In: 10:14:53 AM Scope Out: 10:38:08 AM Scope Withdrawal Time: 0 hours 14 minutes 6 seconds  Total Procedure Duration: 0 hours 23 minutes 15 seconds  Findings:                 Two polyps were found in the sigmoid colon and                            ascending colon. The polyps were 3 to 5 mm in size.                            These polyps were removed with a cold snare.                            Resection and retrieval were complete.  Multiple diverticula were found in the sigmoid                            colon.                           The exam was otherwise without abnormality on                            direct and retroflexion views. Complications:            No immediate complications. Estimated blood loss:                            None. Estimated Blood Loss:     Estimated blood loss: none. Impression:               - Two 3 to 5 mm polyps in the sigmoid colon and in                            the ascending colon, removed with a cold snare.                            Resected and retrieved.                           - Diverticulosis in the sigmoid colon.                           - The examination was otherwise normal on direct                            and retroflexion views. Recommendation:           - Repeat  colonoscopy in 5 years for surveillance.                           - Patient has a contact number available for                            emergencies. The signs and symptoms of potential                            delayed complications were discussed with the                            patient. Return to normal activities tomorrow.                            Written discharge instructions were provided to the                            patient.                           - Resume previous diet.                           -  Continue present medications.                           - Await pathology results. Wilhemina Bonito. Marina Goodell, MD 01/19/2023 10:46:46 AM This report has been signed electronically.

## 2023-01-19 NOTE — Patient Instructions (Signed)
Please read handouts provided. Continue present medications. Await pathology results. Repeat colonoscopy in 5 years.   YOU HAD AN ENDOSCOPIC PROCEDURE TODAY AT THE [facility]:   Refer to the procedure report that was given to you for any specific questions about what was found during the examination.  If the procedure report does not answer your questions, please call your gastroenterologist to clarify.  If you requested that your care partner not be given the details of your procedure findings, then the procedure report has been included in a sealed envelope for you to review at your convenience later.  YOU SHOULD EXPECT: Some feelings of bloating in the abdomen. Passage of more gas than usual.  Walking can help get rid of the air that was put into your GI tract during the procedure and reduce the bloating. If you had a lower endoscopy (such as a colonoscopy or flexible sigmoidoscopy) you may notice spotting of blood in your stool or on the toilet paper. If you underwent a bowel prep for your procedure, you may not have a normal bowel movement for a few days.  Please Note:  You might notice some irritation and congestion in your nose or some drainage.  This is from the oxygen used during your procedure.  There is no need for concern and it should clear up in a day or so.  SYMPTOMS TO REPORT IMMEDIATELY:  Following lower endoscopy (colonoscopy or flexible sigmoidoscopy):  Excessive amounts of blood in the stool  Significant tenderness or worsening of abdominal pains  Swelling of the abdomen that is new, acute  Fever of 100F or higher  For urgent or emergent issues, a gastroenterologist can be reached at any hour by calling [phone]. Do not use MyChart messaging for urgent concerns.    DIET:  We do recommend a small meal at first, but then you may proceed to your regular diet.  Drink plenty of fluids but you should avoid alcoholic beverages for 24 hours.  ACTIVITY:  You  should plan to take it easy for the rest of today and you should NOT DRIVE or use heavy machinery until tomorrow (because of the sedation medicines used during the test).    FOLLOW UP: Our staff will call the number listed on your records the next business day following your procedure.  We will call around 7:15- 8:00 am to check on you and address any questions or concerns that you may have regarding the information given to you following your procedure. If we do not reach you, we will leave a message.     If any biopsies were taken you will be contacted by phone or by letter within the next 1-3 weeks.  Please call us at [phone] if you have not heard about the biopsies in 3 weeks.    SIGNATURES/CONFIDENTIALITY: You and/or your care partner have signed paperwork which will be entered into your electronic medical record.  These signatures attest to the fact that that the information above on your After Visit Summary has been reviewed and is understood.  Full responsibility of the confidentiality of this discharge information lies with you and/or your care-partner.

## 2023-01-21 ENCOUNTER — Telehealth: Payer: Self-pay | Admitting: *Deleted

## 2023-01-21 ENCOUNTER — Encounter: Payer: Self-pay | Admitting: Internal Medicine

## 2023-01-21 DIAGNOSIS — C4441 Basal cell carcinoma of skin of scalp and neck: Secondary | ICD-10-CM | POA: Diagnosis not present

## 2023-01-21 DIAGNOSIS — L820 Inflamed seborrheic keratosis: Secondary | ICD-10-CM | POA: Diagnosis not present

## 2023-01-21 DIAGNOSIS — B9689 Other specified bacterial agents as the cause of diseases classified elsewhere: Secondary | ICD-10-CM | POA: Diagnosis not present

## 2023-01-21 DIAGNOSIS — L02821 Furuncle of head [any part, except face]: Secondary | ICD-10-CM | POA: Diagnosis not present

## 2023-01-21 NOTE — Telephone Encounter (Signed)
  Follow up Call-     01/19/2023    9:43 AM  Call back number  Post procedure Call Back phone  # [phone]  Permission to leave phone message Yes     Patient questions:  Do you have a fever, pain , or abdominal swelling? No. Pain Score  0 *  Have you tolerated food without any problems? Yes.    Have you been able to return to your normal activities? Yes.    Do you have any questions about your discharge instructions: Diet   No. Medications  No. Follow up visit  No.  Do you have questions or concerns about your Care? No.  Actions: * If pain score is 4 or above: No action needed, pain <4.

## 2023-02-04 ENCOUNTER — Telehealth: Payer: Self-pay | Admitting: Family Medicine

## 2023-02-04 NOTE — Telephone Encounter (Signed)
Caller name: TRENITY BASURTO  On DPR?: Yes  Call back number: [phone] (home)  Provider they see: Sheliah Hatch, MD  Reason for call:   Pt called Fever currently 100 pulse 94 and hasn't tested yet for Covid. Sent to triage.

## 2023-02-04 NOTE — Telephone Encounter (Signed)
Noted  

## 2023-02-04 NOTE — Telephone Encounter (Signed)
Patient Name First: Jerry Last: Simpson Gender: Male DOB: 16-Aug-1945 Age: 77 Y 1 M 29 D Return Phone Number: [phone] (Primary) Address: City/ State/ Zip: [place]  [address] Client [facility] [place] [facility] [place] Client Site [facility] Provider Lezlie Octave- MD Contact Type Call Who Is Calling Patient / Member / Family / Caregiver Call Type Triage / Clinical Relationship To Patient Self Return Phone Number [phone] (Primary) Chief Complaint CHEST PAIN - pain, pressure, heaviness or tightness Jerry for Call Symptomatic / Request for Health Information Initial Comment (1/2)Caller states that she is calling from the office. Caller states that fever of 101. Caller states that the patient has covid symptoms and feeling worse and worse every second. Caller states they having a cough and chest pain. Translation No Nurse Assessment Nurse: Margaretha Sheffield, RN, Cala Bradford Date/Time (Eastern Time): 02/04/2023 2:08:54 PM Confirm and document Jerry for call. If symptomatic, describe symptoms. ---Caller states he is having fever, fatigue, weakness, and cough- runny nose. Temp 100.7 orally Does the patient have any new or worsening symptoms? ---Yes Will a triage be completed? ---Yes Related visit to physician within the last 2 weeks? ---Yes Does the PT have any chronic conditions? (i.e. diabetes, asthma, this includes High risk factors for pregnancy, etc.) ---Yes List chronic conditions. ---Covid 6 yrs ago Is this a behavioral health or substance abuse call? ---No Guidelines Guideline Title Affirmed Question Affirmed Notes Nurse Date/Time (Eastern Time) COVID-19 - Diagnosed or Suspected Oxygen level (e.g., pulse oximetry) 91 to 94 percent Margaretha Sheffield, RN, Cala Bradford 02/04/2023 2:11:55 PM PLEASE NOTE: All timestamps contained within this report are represented as [place] Standard Time. CONFIDENTIALTY NOTICE: This fax  transmission is intended only for the addressee. It contains information that is legally privileged, confidential or otherwise protected from use or disclosure. If you are not the intended recipient, you are strictly prohibited from reviewing, disclosing, copying using or disseminating any of this information or taking any action in reliance on or regarding this information. If you have received this fax in error, please notify us immediately by telephone so that we can arrange for its return to [place]. Phone: [phone], Toll-Free: [phone], Fax: [phone] Page: 2 of 2 Call Id: [id number] Disp. Time Lamount Cohen Time) Disposition Final User 02/04/2023 2:05:43 PM Send to Urgent Blair Dolphin 02/04/2023 2:07:49 PM Attempt made - line busy Margaretha Sheffield, RN, Cala Bradford 02/04/2023 2:16:37 PM Call PCP Now Yes Margaretha Sheffield, RN, Cala Bradford Final Disposition 02/04/2023 2:16:37 PM Call PCP Now Yes Margaretha Sheffield, RN, Anders Simmonds Disagree/Comply Comply Caller Understands Yes PreDisposition Call Doctor Care Advice Given Per Guideline CALL PCP NOW: * You need to discuss this with your doctor (or NP/PA). CALL BACK IF: * You become worse CARE ADVICE given per COVID-19 - DIAGNOSED OR SUSPECTED (Adult) guideline. Comments User: Doristine Counter, RN Date/Time Lamount Cohen Time): 02/04/2023 2:06:39 PM Caller not on the line/in Urgent Que upon receiving this chart. User: Doristine Counter, RN Date/Time Lamount Cohen Time): 02/04/2023 2:07:35 PM Phone rings and then switches to a fast busy signal. User: Doristine Counter, RN Date/Time Lamount Cohen Time): 02/04/2023 2:13:22 PM Milder s/s x lst night- worse today x around lunch User: Doristine Counter, RN Date/Time Lamount Cohen Time): 02/04/2023 2:14:42 PM SaO2 94% User: Doristine Counter, RN Date/Time (Eastern Time): 02/04/2023 2:19:12 PM Transferred caller to client office.  Will call tomorrow morning and check on symptoms

## 2023-02-07 ENCOUNTER — Telehealth (INDEPENDENT_AMBULATORY_CARE_PROVIDER_SITE_OTHER): Payer: Medicare HMO | Admitting: Family Medicine

## 2023-02-07 ENCOUNTER — Encounter: Payer: Self-pay | Admitting: Family Medicine

## 2023-02-07 DIAGNOSIS — U071 COVID-19: Secondary | ICD-10-CM | POA: Diagnosis not present

## 2023-02-07 MED ORDER — ALBUTEROL SULFATE HFA 108 (90 BASE) MCG/ACT IN AERS: 2.0000 | INHALATION_SPRAY | Freq: Four times a day (QID) | RESPIRATORY_TRACT | 0 refills | Status: DC | PRN

## 2023-02-07 MED ORDER — GUAIFENESIN-CODEINE 100-10 MG/5ML PO SYRP: 10.0000 mL | ORAL_SOLUTION | Freq: Three times a day (TID) | ORAL | 0 refills | Status: DC | PRN

## 2023-02-07 NOTE — Progress Notes (Signed)
Virtual Visit via Video   I connected with patient on 02/07/23 at 12:40 PM EDT by a video enabled telemedicine application and verified that I am speaking with the correct person using two identifiers.  Location patient: Home Location provider: [place] [facility], Office Persons participating in the virtual visit: Patient, Provider, CMA Sheryle Hail C)  I discussed the limitations of evaluation and management by telemedicine and the availability of in person appointments. The patient expressed understanding and agreed to proceed.  Subjective:   HPI:   COVID- Saturday pt 'thought I was going to die'.  Went upstairs to take a nap and 'had a miracle'.  Pt reports feeling better w/ exception of dry cough.  Denies SOB.  O2 95-97%.  ROS:   See pertinent positives and negatives per HPI.  Patient Active Problem List   Diagnosis Date Noted   Murmur 01/24/2020   SOB (shortness of breath) 01/24/2020   Overweight (BMI 25.0-29.9) 01/17/2020   Upper airway cough syndrome 06/12/2015   Physical exam 07/09/2014   Bladder tumor    Reactive airway disease that is not asthma 05/13/2014   Blood donor 07/04/2013   HYPERGLYCEMIA, FASTING 07/16/2010   Coronary atherosclerosis 10/16/2008   BENIGN PROSTATIC HYPERTROPHY 10/16/2008   SKIN CANCER, HX OF 10/16/2008   COLONIC POLYPS, HX OF 10/16/2008   ELEVATED BLOOD PRESSURE WITHOUT DIAGNOSIS OF HYPERTENSION 06/05/2008   Hyperlipidemia 09/27/2007   GERD 06/20/2007   Allergic rhinitis, cause unspecified 05/05/2007    Social History   Tobacco Use   Smoking status: Never   Smokeless tobacco: Never  Substance Use Topics   Alcohol use: Yes    Alcohol/week: 3.0 standard drinks of alcohol    Types: 3 drink(s) per week    Comment:  < 7/ week    Current Outpatient Medications:    aspirin EC 81 MG tablet, Take 1 tablet by mouth daily., Disp: , Rfl:    BIOTIN PO, Take 1 tablet by mouth daily., Disp: , Rfl:    Coenzyme Q10 (CO Q-10) 100 MG CAPS, Take  1 capsule by mouth daily., Disp: , Rfl:    cyanocobalamin 2000 MCG tablet, Take 2,000 mcg by mouth daily., Disp: , Rfl:    fenofibrate 160 MG tablet, Take 1 tablet (160 mg total) by mouth daily., Disp: 90 tablet, Rfl: 1   fexofenadine (ALLEGRA) 180 MG tablet, Take 180 mg by mouth daily as needed for allergies. , Disp: , Rfl:    finasteride (PROPECIA) 1 MG tablet, Take 0.5 tablets by mouth daily., Disp: , Rfl:    fluticasone (FLONASE) 50 MCG/ACT nasal spray, Place 2 sprays into both nostrils daily. (Patient taking differently: Place 2 sprays into both nostrils daily as needed.), Disp: 16 g, Rfl: 1   folic acid (FOLVITE) 1 MG tablet, Take 1 mg by mouth daily., Disp: , Rfl:    Multiple Vitamin (MULTIVITAMIN) tablet, Take 1 tablet by mouth daily., Disp: , Rfl:    rosuvastatin (CRESTOR) 10 MG tablet, Take 1 tablet (10 mg total) by mouth daily., Disp: 90 tablet, Rfl: 1  Allergies  Allergen Reactions   Sulfonamide Derivatives Rash    RASH  Because of a history of documented adverse serious drug reaction;Medi Alert bracelet  is recommended   Claritin [Loratadine]     Pt states it affects his prostate.    Minoxidil Hives and Rash   Sulfa Antibiotics Rash    Objective:   There were no vitals taken for this visit. Pt is able to speak clearly, coherently  without shortness of breath or increased work of breathing. + dry cough Thought process is linear.  Mood is appropriate.   Assessment and Plan:   COVID- new.  Pt has not tested + but everyone in the house has and he had similar sxs.  Reports that Saturday he felt terrible until he took a nap and woke up feeling much better.  Only lingering sxs is his dry cough.  Will start cough syrup PRN.  Reviewed supportive care and red flags that should prompt return.  Pt expressed understanding and is in agreement w/ plan.    Neena Rhymes, MD 02/07/2023

## 2023-02-08 ENCOUNTER — Telehealth: Payer: Medicare HMO | Admitting: Internal Medicine

## 2023-02-11 ENCOUNTER — Telehealth: Payer: Self-pay | Admitting: Family Medicine

## 2023-02-11 NOTE — Telephone Encounter (Signed)
Patient Name First: Jerry Last: Simpson Gender: Male DOB: [birth year] Age: 77 Y 2 M 5 D Return Phone Number: [phone] (Primary) Address: City/ State/ Zip: [place]  [address] Client [facility] [place] [facility] - [place] Client Site [facility] - Day Provider Lezlie Octave- MD Contact Type Call Who Is Calling Patient / Member / Family / Caregiver Call Type Triage / Clinical Relationship To Patient Self Return Phone Number [phone] (Primary) Chief Complaint CHEST PAIN - pain, pressure, heaviness or tightness Reason for Call Symptomatic / Request for Health Information Initial Comment (1/2)Caller states that she is calling from the office. Caller states that fever of 101. Caller states that the patient has covid symptoms and feeling worse and worse every second. Caller states they having a cough and chest pain. Translation No Nurse Assessment Nurse: Margaretha Sheffield, RN, Cala Bradford Date/Time (Eastern Time): 02/04/2023 2:08:54 PM Confirm and document reason for call. If symptomatic, describe symptoms. ---Caller states he is having fever, fatigue, weakness, and cough- runny nose. Temp 100.7 orally Does the patient have any new or worsening symptoms? ---Yes Will a triage be completed? ---Yes Related visit to physician within the last 2 weeks? ---Yes Does the PT have any chronic conditions? (i.e. diabetes, asthma, this includes High risk factors for pregnancy, etc.) ---Yes List chronic conditions. ---Covid 6 yrs ago Is this a behavioral health or substance abuse call? ---No Guidelines Guideline Title Affirmed Question Affirmed Notes Nurse Date/Time (Eastern Time) COVID-19 - Diagnosed or Suspected Oxygen level (e.g., pulse oximetry) 91 to 94 percent Margaretha Sheffield, RN, Cala Bradford 02/04/2023 2:11:55 PM PLEASE NOTE: All timestamps contained within this report are represented as [place] Standard Time. CONFIDENTIALTY NOTICE: This fax  transmission is intended only for the addressee. It contains information that is legally privileged, confidential or otherwise protected from use or disclosure. If you are not the intended recipient, you are strictly prohibited from reviewing, disclosing, copying using or disseminating any of this information or taking any action in reliance on or regarding this information. If you have received this fax in error, please notify us immediately by telephone so that we can arrange for its return to [place]. Phone: [phone], Toll-Free: [phone], Fax: [phone] Page: 2 of 2 Call Id: [id number] Disp. Time Lamount Cohen Time) Disposition Final User 02/04/2023 2:05:43 PM Send to Urgent Blair Dolphin 02/04/2023 2:07:49 PM Attempt made - line busy Margaretha Sheffield, RN, Cala Bradford 02/04/2023 2:16:37 PM Call PCP Now Yes Margaretha Sheffield, RN, Cala Bradford Final Disposition 02/04/2023 2:16:37 PM Call PCP Now Yes Margaretha Sheffield, RN, Anders Simmonds Disagree/Comply Comply Caller Understands Yes PreDisposition Call Doctor Care Advice Given Per Guideline CALL PCP NOW: * You need to discuss this with your doctor (or NP/PA). CALL BACK IF: * You become worse CARE ADVICE given per COVID-19 - DIAGNOSED OR SUSPECTED (Adult) guideline. Comments User: Doristine Counter, RN Date/Time Lamount Cohen Time): 02/04/2023 2:06:39 PM Caller not on the line/in Urgent Que upon receiving this chart. User: Doristine Counter, RN Date/Time Lamount Cohen Time): 02/04/2023 2:07:35 PM Phone rings and then switches to a fast busy signal. User: Doristine Counter, RN Date/Time Lamount Cohen Time): 02/04/2023 2:13:22 PM Milder s/s x lst night- worse today x around lunch User: Doristine Counter, RN Date/Time Lamount Cohen Time): 02/04/2023 2:14:42 PM SaO2 94% User: Doristine Counter, RN Date/Time (Eastern Time): 02/04/2023 2:19:12 PM Transferred caller to client office.

## 2023-02-14 NOTE — Telephone Encounter (Signed)
I called and spoke to the pt and he advised he is feeling better .  He states he did not go to the ER . He states he was taking Tramadol and it works for a cough .  Still has some congestion and cough but feeling much better .

## 2023-02-17 DIAGNOSIS — J309 Allergic rhinitis, unspecified: Secondary | ICD-10-CM | POA: Diagnosis not present

## 2023-02-17 DIAGNOSIS — L649 Androgenic alopecia, unspecified: Secondary | ICD-10-CM | POA: Diagnosis not present

## 2023-02-17 DIAGNOSIS — Z882 Allergy status to sulfonamides status: Secondary | ICD-10-CM | POA: Diagnosis not present

## 2023-02-17 DIAGNOSIS — I10 Essential (primary) hypertension: Secondary | ICD-10-CM | POA: Diagnosis not present

## 2023-02-17 DIAGNOSIS — Z818 Family history of other mental and behavioral disorders: Secondary | ICD-10-CM | POA: Diagnosis not present

## 2023-02-17 DIAGNOSIS — Z809 Family history of malignant neoplasm, unspecified: Secondary | ICD-10-CM | POA: Diagnosis not present

## 2023-02-17 DIAGNOSIS — Z8616 Personal history of COVID-19: Secondary | ICD-10-CM | POA: Diagnosis not present

## 2023-02-17 DIAGNOSIS — Z833 Family history of diabetes mellitus: Secondary | ICD-10-CM | POA: Diagnosis not present

## 2023-02-17 DIAGNOSIS — Z85828 Personal history of other malignant neoplasm of skin: Secondary | ICD-10-CM | POA: Diagnosis not present

## 2023-02-17 DIAGNOSIS — I2510 Atherosclerotic heart disease of native coronary artery without angina pectoris: Secondary | ICD-10-CM | POA: Diagnosis not present

## 2023-02-17 DIAGNOSIS — Z8249 Family history of ischemic heart disease and other diseases of the circulatory system: Secondary | ICD-10-CM | POA: Diagnosis not present

## 2023-02-17 DIAGNOSIS — E785 Hyperlipidemia, unspecified: Secondary | ICD-10-CM | POA: Diagnosis not present

## 2023-02-18 ENCOUNTER — Telehealth: Payer: Self-pay | Admitting: Family Medicine

## 2023-02-18 MED ORDER — PREDNISONE 10 MG PO TABS: ORAL_TABLET | ORAL | 0 refills | Status: DC

## 2023-02-18 NOTE — Telephone Encounter (Signed)
Caller name: DARRELL EMRICH  On DPR?: Yes  Call back number: [phone] (mobile)  Provider they see: Sheliah Hatch, MD  Reason for call:  Pt states he's still having cough of 2 weeks asking for Prednisone. (Nagging and persistent).

## 2023-02-18 NOTE — Telephone Encounter (Signed)
Prescription sent to pharmacy.  If no improvement after taking the prednisone will need an OV

## 2023-02-18 NOTE — Telephone Encounter (Signed)
Pt states he has tried OTC medication but still has the cough post COVID . I explained he could have the cough for  a while he did not like that response still requesting if we can send in something

## 2023-02-18 NOTE — Telephone Encounter (Signed)
I have explained to the pt we sent in Prednisolone and if no improvement after taking it he will need to be seen . Pt expressed verbal understanding

## 2023-03-11 DIAGNOSIS — Z08 Encounter for follow-up examination after completed treatment for malignant neoplasm: Secondary | ICD-10-CM | POA: Diagnosis not present

## 2023-03-11 DIAGNOSIS — Z85828 Personal history of other malignant neoplasm of skin: Secondary | ICD-10-CM | POA: Diagnosis not present

## 2023-03-15 ENCOUNTER — Encounter: Payer: Self-pay | Admitting: Family Medicine

## 2023-03-17 ENCOUNTER — Encounter: Payer: Self-pay | Admitting: Family Medicine

## 2023-03-17 ENCOUNTER — Ambulatory Visit (INDEPENDENT_AMBULATORY_CARE_PROVIDER_SITE_OTHER): Payer: Medicare HMO | Admitting: Family Medicine

## 2023-03-17 DIAGNOSIS — R058 Other specified cough: Secondary | ICD-10-CM

## 2023-03-17 MED ORDER — PREDNISONE 10 MG PO TABS: ORAL_TABLET | ORAL | 0 refills | Status: DC

## 2023-03-17 NOTE — Patient Instructions (Signed)
Follow up as needed or as scheduled START the Prednisone as directed- 3 pills at the same time x3 days, then 2 pills at the same time x3 days, then 1 pill daily.  Take w/ food  Drink LOTS of fluids Call with any questions or concerns Stay Safe!  Stay Healthy! Hang in there!!

## 2023-03-21 ENCOUNTER — Other Ambulatory Visit: Payer: Self-pay

## 2023-03-21 DIAGNOSIS — E785 Hyperlipidemia, unspecified: Secondary | ICD-10-CM

## 2023-03-21 MED ORDER — ROSUVASTATIN CALCIUM 10 MG PO TABS
10.0000 mg | ORAL_TABLET | Freq: Every day | ORAL | 1 refills | Status: DC
Start: 2023-03-21 — End: 2023-10-21

## 2023-03-21 NOTE — Telephone Encounter (Signed)
Patient was in the office with his wife this morning and he stated that he needed a refill on his cholesterol medication the generic brand rosuvastatin to be sent to his local pharmacy.   Medication: Rosuvastatin 10 mg Directions: take 1 tablet by mouth daily  Last given: 09/10/22 Number refills: 1 Last o/v: 03/17/23 Follow up: 07/08/23 Labs:

## 2023-04-12 ENCOUNTER — Telehealth: Payer: Self-pay | Admitting: Family Medicine

## 2023-04-12 NOTE — Telephone Encounter (Signed)
Placed in folder at nurse station

## 2023-04-12 NOTE — Telephone Encounter (Signed)
Chart note placed in front bin

## 2023-04-19 NOTE — Telephone Encounter (Signed)
Reviewed and sent for scan.  No action needed

## 2023-05-11 DIAGNOSIS — R69 Illness, unspecified: Secondary | ICD-10-CM | POA: Diagnosis not present

## 2023-05-29 ENCOUNTER — Emergency Department (HOSPITAL_BASED_OUTPATIENT_CLINIC_OR_DEPARTMENT_OTHER)
Admission: EM | Admit: 2023-05-29 | Discharge: 2023-05-29 | Disposition: A | Discharge status: Home / Self Care | Payer: Medicare HMO | Attending: Emergency Medicine | Admitting: Emergency Medicine

## 2023-05-29 ENCOUNTER — Encounter (HOSPITAL_BASED_OUTPATIENT_CLINIC_OR_DEPARTMENT_OTHER): Payer: Self-pay | Admitting: Emergency Medicine

## 2023-05-29 ENCOUNTER — Emergency Department (HOSPITAL_BASED_OUTPATIENT_CLINIC_OR_DEPARTMENT_OTHER): Payer: Medicare HMO

## 2023-05-29 ENCOUNTER — Other Ambulatory Visit: Payer: Self-pay

## 2023-05-29 DIAGNOSIS — I6523 Occlusion and stenosis of bilateral carotid arteries: Secondary | ICD-10-CM | POA: Diagnosis not present

## 2023-05-29 DIAGNOSIS — R42 Dizziness and giddiness: Secondary | ICD-10-CM | POA: Diagnosis not present

## 2023-05-29 DIAGNOSIS — I672 Cerebral atherosclerosis: Secondary | ICD-10-CM | POA: Diagnosis not present

## 2023-05-29 DIAGNOSIS — Z79899 Other long term (current) drug therapy: Secondary | ICD-10-CM | POA: Diagnosis not present

## 2023-05-29 DIAGNOSIS — I10 Essential (primary) hypertension: Secondary | ICD-10-CM | POA: Insufficient documentation

## 2023-05-29 DIAGNOSIS — I2510 Atherosclerotic heart disease of native coronary artery without angina pectoris: Secondary | ICD-10-CM | POA: Diagnosis not present

## 2023-05-29 DIAGNOSIS — H538 Other visual disturbances: Secondary | ICD-10-CM | POA: Diagnosis not present

## 2023-05-29 DIAGNOSIS — R059 Cough, unspecified: Secondary | ICD-10-CM | POA: Diagnosis not present

## 2023-05-29 LAB — URINALYSIS, ROUTINE W REFLEX MICROSCOPIC
Bilirubin Urine: NEGATIVE
Glucose, UA: NEGATIVE mg/dL
Hgb urine dipstick: NEGATIVE
Ketones, ur: NEGATIVE mg/dL
Leukocytes,Ua: NEGATIVE
Nitrite: NEGATIVE
Protein, ur: NEGATIVE mg/dL
Specific Gravity, Urine: 1.026 (ref 1.005–1.030)
pH: 5.5 (ref 5.0–8.0)

## 2023-05-29 LAB — BASIC METABOLIC PANEL
Anion gap: 9 (ref 5–15)
BUN: 21 mg/dL (ref 8–23)
CO2: 24 mmol/L (ref 22–32)
Calcium: 9.1 mg/dL (ref 8.9–10.3)
Chloride: 106 mmol/L (ref 98–111)
Creatinine, Ser: 1.18 mg/dL (ref 0.61–1.24)
GFR, Estimated: >60 mL/min (ref >60)
Glucose, Bld: 169 mg/dL — ABNORMAL HIGH (ref 70–99)
Potassium: 3.7 mmol/L (ref 3.5–5.1)
Sodium: 139 mmol/L (ref 135–145)

## 2023-05-29 LAB — CBC
HCT: 39.2 % (ref 39.0–52.0)
Hemoglobin: 13.5 g/dL (ref 13.0–17.0)
MCH: 29.9 pg (ref 26.0–34.0)
MCHC: 34.4 g/dL (ref 30.0–36.0)
MCV: 86.9 fL (ref 80.0–100.0)
Platelets: 269 10*3/uL (ref 150–400)
RBC: 4.51 MIL/uL (ref 4.22–5.81)
RDW: 13.1 % (ref 11.5–15.5)
WBC: 6.1 10*3/uL (ref 4.0–10.5)
nRBC: 0 % (ref 0.0–0.2)

## 2023-05-29 LAB — CBG MONITORING, ED: Glucose-Capillary: 146 mg/dL — ABNORMAL HIGH (ref 70–99)

## 2023-05-29 MED ORDER — MECLIZINE HCL 25 MG PO TABS: 25.0000 mg | ORAL_TABLET | Freq: Two times a day (BID) | ORAL | 0 refills | Status: DC | PRN

## 2023-05-29 MED ORDER — IOHEXOL 350 MG/ML SOLN
75.0000 mL | Freq: Once | INTRAVENOUS | Status: AC | PRN
  Administered 2023-05-29: 75 mL via INTRAVENOUS

## 2023-05-29 MED ORDER — SODIUM CHLORIDE 0.9 % IV BOLUS
1000.0000 mL | Freq: Once | INTRAVENOUS | Status: AC
  Administered 2023-05-29: 1000 mL via INTRAVENOUS

## 2023-05-29 NOTE — ED Provider Notes (Signed)
[place] EMERGENCY DEPARTMENT AT [facility] Provider Note  CSN: [id number] Arrival date & time: 05/29/23 1308  Chief Complaint(s) Dizziness  HPI Jerry Simpson is a 77 y.o. male with past medical history as below, significant for, CAD, GERD, HLD who presents to the ED with complaint of dizziness, spinning sensation  Patient reports he just returned from [place], got out of his vehicle and when he stood up he felt dizzy, spinning sensation.  Symptoms have improved since the onset.  No vomiting.  No numbness or tingling or weakness to his extremities, no headache.  No tinnitus or hearing loss.  Similar symptoms in the past associated with blood donation.  Did donate blood a few days ago.  No recent diet or medication changes.  No vomiting.  Past Medical History Past Medical History:  Diagnosis Date   Adenomatous polyps    Arthritis    generalized- spine   Borderline systolic HTN    BPH (benign prostatic hyperplasia)    elevated PSA, Dr Annabell Howells   CAD (coronary artery disease)    Cataract    bilateral sx   Environmental allergies    GERD (gastroesophageal reflux disease)    NOT on meds/hx of   Hyperlipidemia    on meds   Seasonal allergies    PRN meds   Patient Active Problem List   Diagnosis Date Noted   Murmur 01/24/2020   SOB (shortness of breath) 01/24/2020   Overweight (BMI 25.0-29.9) 01/17/2020   Upper airway cough syndrome 06/12/2015   Physical exam 07/09/2014   Bladder tumor    Reactive airway disease without asthma 05/13/2014   Blood donor 07/04/2013   HYPERGLYCEMIA, FASTING 07/16/2010   Coronary atherosclerosis 10/16/2008   BENIGN PROSTATIC HYPERTROPHY 10/16/2008   SKIN CANCER, HX OF 10/16/2008   History of colonic polyps 10/16/2008   ELEVATED BLOOD PRESSURE WITHOUT DIAGNOSIS OF HYPERTENSION 06/05/2008   Hyperlipidemia 09/27/2007   GERD 06/20/2007   Allergic rhinitis, cause unspecified 05/05/2007   Home Medication(s) Prior to Admission  medications   Medication Sig Start Date End Date Taking? Authorizing Provider  meclizine (ANTIVERT) 25 MG tablet Take 1 tablet (25 mg total) by mouth 2 (two) times daily as needed for dizziness. 05/29/23  Yes Tanda Rockers A, DO  albuterol (VENTOLIN HFA) 108 (90 Base) MCG/ACT inhaler Inhale 2 puffs into the lungs every 6 (six) hours as needed for wheezing or shortness of breath. 02/07/23   Sheliah Hatch, MD  aspirin EC 81 MG tablet Take 1 tablet by mouth daily.    [provider]  BIOTIN PO Take 1 tablet by mouth daily.    [provider]  Coenzyme Q10 (CO Q-10) 100 MG CAPS Take 1 capsule by mouth daily. 10/20/19   [provider]  cyanocobalamin 2000 MCG tablet Take 2,000 mcg by mouth daily.    [provider]  fenofibrate 160 MG tablet Take 1 tablet (160 mg total) by mouth daily. 01/05/23   Sheliah Hatch, MD  fexofenadine (ALLEGRA) 180 MG tablet Take 180 mg by mouth daily as needed for allergies.     [provider]  finasteride (PROPECIA) 1 MG tablet Take 0.5 tablets by mouth daily. 08/20/19   [provider]  fluticasone (FLONASE) 50 MCG/ACT nasal spray Place 2 sprays into both nostrils daily. Patient taking differently: Place 2 sprays into both nostrils daily as needed. 05/13/15   Saguier, Ramon Dredge, PA-C  folic acid (FOLVITE) 1 MG tablet Take 1 mg by mouth daily.  [provider]  guaiFENesin-codeine (ROBITUSSIN AC) 100-10 MG/5ML syrup Take 10 mLs by mouth 3 (three) times daily as needed for cough. 02/07/23   Sheliah Hatch, MD  Multiple Vitamin (MULTIVITAMIN) tablet Take 1 tablet by mouth daily.    [provider]  predniSONE (DELTASONE) 10 MG tablet 3 tabs x3 days and then 2 tabs x3 days and then 1 tab x3 days.  Take w/ food. 03/17/23   Sheliah Hatch, MD  rosuvastatin (CRESTOR) 10 MG tablet Take 1 tablet (10 mg total) by mouth daily. 03/21/23   Sheliah Hatch, MD                                                                                                                                     Past Surgical History Past Surgical History:  Procedure Laterality Date   CATARACT EXTRACTION, BILATERAL  2012   COLONOSCOPY  2019   JP-MAC-suprep(exc)-tics/TA x 2 frag-5 yr recall   CORONARY ANGIOPLASTY WITH STENT PLACEMENT  2004   Dr. Myrtis Ser   POLYPECTOMY  2019   TA x 2 frags   prostatic artery embolization     TONSILLECTOMY  1962   Family History Family History  Problem Relation Age of Onset   Hypertension Father    Heart attack Father 23   Diabetes Father    Lung cancer Brother        leukemia terminally   Heart attack Brother 24   Heart disease Paternal Uncle 45   Stroke Maternal Grandmother        >65   Colon cancer Neg Hx    Stomach cancer Neg Hx    Colon polyps Neg Hx    Esophageal cancer Neg Hx    Rectal cancer Neg Hx     Social History Social History   Tobacco Use   Smoking status: Never   Smokeless tobacco: Never  Vaping Use   Vaping status: Never Used  Substance Use Topics   Alcohol use: Yes    Alcohol/week: 3.0 standard drinks of alcohol    Types: 3 drink(s) per week    Comment:  < 7/ week   Drug use: No   Allergies Sulfonamide derivatives, Claritin [loratadine], Minoxidil, and Sulfa antibiotics  Review of Systems Review of Systems  Constitutional:  Negative for chills and fever.  Respiratory:  Negative for shortness of breath.   Cardiovascular:  Negative for chest pain.  Gastrointestinal:  Negative for abdominal pain, nausea and vomiting.  Skin:  Negative for rash.  Neurological:  Positive for dizziness. Negative for headaches.  All other systems reviewed and are negative.   Physical Exam Vital Signs  I have reviewed the triage vital signs BP (!) 168/88   Pulse 72   Temp 98.6 F (37 C)   Resp 14   Wt 93 kg   SpO2 96%   BMI 26.32 kg/m  Physical Exam Vitals and nursing note  reviewed.  Constitutional:      General: He is not in acute  distress.    Appearance: He is well-developed.  HENT:     Head: Normocephalic and atraumatic.     Right Ear: External ear normal.     Left Ear: External ear normal.     Mouth/Throat:     Mouth: Mucous membranes are moist.  Eyes:     General: No scleral icterus.    Extraocular Movements: Extraocular movements intact.     Conjunctiva/sclera: Conjunctivae normal.     Pupils: Pupils are equal, round, and reactive to light.  Cardiovascular:     Rate and Rhythm: Normal rate and regular rhythm.     Pulses: Normal pulses.     Heart sounds: Normal heart sounds.  Pulmonary:     Effort: Pulmonary effort is normal. No respiratory distress.     Breath sounds: Normal breath sounds.  Abdominal:     General: Abdomen is flat.     Palpations: Abdomen is soft.     Tenderness: There is no abdominal tenderness.  Musculoskeletal:     Cervical back: No rigidity.     Right lower leg: No edema.     Left lower leg: No edema.  Skin:    General: Skin is warm and dry.     Capillary Refill: Capillary refill takes less than 2 seconds.  Neurological:     Mental Status: He is alert and oriented to person, place, and time.     GCS: GCS eye subscore is 4. GCS verbal subscore is 5. GCS motor subscore is 6.     Cranial Nerves: Cranial nerves 2-12 are intact. No dysarthria or facial asymmetry.     Sensory: Sensation is intact. No sensory deficit.     Motor: Motor function is intact. No tremor.     Coordination: Coordination is intact. Finger-Nose-Finger Test normal.     Gait: Gait is intact.     Comments: Strength 5/5 bilateral upper and lower extremity symmetric  Psychiatric:        Mood and Affect: Mood normal.        Behavior: Behavior normal.     ED Results and Treatments Labs (all labs ordered are listed, but only abnormal results are displayed) Labs Reviewed  BASIC METABOLIC PANEL - Abnormal; Notable for the following components:      Result Value   Glucose, Bld 169 (*)    All other components  within normal limits  CBG MONITORING, ED - Abnormal; Notable for the following components:   Glucose-Capillary 146 (*)    All other components within normal limits  CBC  URINALYSIS, ROUTINE W REFLEX MICROSCOPIC  CBG MONITORING, ED                                                                                                                          Radiology DG Chest Portable 1 View  Result Date: 05/29/2023 CLINICAL DATA:  Cough and dizziness EXAM: PORTABLE  CHEST 1 VIEW COMPARISON:  05/13/2010 FINDINGS: Cardiac shadow is mildly prominent but accentuated by the portable technique. The lungs are well aerated bilaterally. No focal infiltrate or effusion is noted. No bony abnormality is seen. IMPRESSION: No acute abnormality noted. Electronically Signed   By: Alcide Clever M.D.   On: 05/29/2023 21:20   CT ANGIO HEAD NECK W WO CM  Result Date: 05/29/2023 CLINICAL DATA:  Vertigo, dizziness and blurred vision EXAM: CT ANGIOGRAPHY HEAD AND NECK WITH AND WITHOUT CONTRAST TECHNIQUE: Multidetector CT imaging of the head and neck was performed using the standard protocol during bolus administration of intravenous contrast. Multiplanar CT image reconstructions and MIPs were obtained to evaluate the vascular anatomy. Carotid stenosis measurements (when applicable) are obtained utilizing NASCET criteria, using the distal internal carotid diameter as the denominator. RADIATION DOSE REDUCTION: This exam was performed according to the departmental dose-optimization program which includes automated exposure control, adjustment of the mA and/or kV according to patient size and/or use of iterative reconstruction technique. CONTRAST:  75mL OMNIPAQUE IOHEXOL 350 MG/ML SOLN COMPARISON:  None Available. FINDINGS: CT HEAD FINDINGS Brain: No evidence of acute infarct, hemorrhage, mass, mass effect, or midline shift. No hydrocephalus or extra-axial fluid collection. Vascular: No hyperdense vessel. Atherosclerotic calcifications  in the intracranial carotid and vertebral arteries. Skull: Negative for fracture or focal lesion. Sinuses/Orbits: No acute finding. Other: The mastoid air cells are well aerated. CTA NECK FINDINGS Aortic arch: Four-vessel arch, with the left vertebral artery originating from the aorta. Imaged portion shows no evidence of aneurysm or dissection. No significant stenosis of the major arch vessel origins. Right carotid system: No evidence of dissection, occlusion, or hemodynamically significant stenosis (greater than 50%). Left carotid system: No evidence of dissection, occlusion, or hemodynamically significant stenosis (greater than 50%). Vertebral arteries: Right dominant system. No evidence of dissection, occlusion, or hemodynamically significant stenosis (greater than 50%). Skeleton: No acute osseous abnormality. Degenerative changes in the cervical spine. Other hypoenhancing foci in the thyroid, which measure up to 3 mm, for which no follow-up is currently indicated. (Reference: J Am Coll Radiol. 2015 Feb;12(2): 143-50) : No acute finding. Upper chest: No focal pulmonary opacity or pleural effusion. Review of the MIP images confirms the above findings CTA HEAD FINDINGS Anterior circulation: Both internal carotid arteries are patent to the termini, with mild stenosis in the bilateral cavernous ICA. A1 segments patent. Normal anterior communicating artery. Anterior cerebral arteries are patent to their distal aspects without significant stenosis. No M1 stenosis or occlusion. MCA branches perfused to their distal aspects without significant stenosis. Posterior circulation: Vertebral arteries patent to the vertebrobasilar junction without significant with mild stenosis in the mid V4 segment bilaterally. Posterior inferior cerebellar arteries patent proximally. Basilar patent to its distal aspect without significant stenosis. Superior cerebellar arteries patent proximally. Patent P1 segments. PCAs perfused to their  distal aspects without significant stenosis. The bilateral posterior communicating arteries are not visualized. Venous sinuses: As permitted by contrast timing, patent. Anatomic variants: None significant. No evidence of aneurysm or vascular malformation. Review of the MIP images confirms the above findings IMPRESSION: 1. No acute intracranial process. 2. No intracranial large vessel occlusion or significant stenosis. Mild stenosis in the bilateral cavernous ICA and mid V4 segments. 3. No hemodynamically significant stenosis in the neck. Electronically Signed   By: Wiliam Ke M.D.   On: 05/29/2023 21:19    Pertinent labs & imaging results that were available during my care of the patient were reviewed by me and considered in my  medical decision making (see MDM for details).  Medications Ordered in ED Medications  sodium chloride 0.9 % bolus 1,000 mL (0 mLs Intravenous Stopped 05/29/23 2321)  iohexol (OMNIPAQUE) 350 MG/ML injection 75 mL (75 mLs Intravenous Contrast Given 05/29/23 2102)                                                                                                                                     Procedures Procedures  (including critical care time)  Medical Decision Making / ED Course    Medical Decision Making:    Jerry Simpson is a 77 y.o. male with past medical history as below, significant for, CAD, GERD, HLD who presents to the ED with complaint of dizziness, spinning sensation. The complaint involves an extensive differential diagnosis and also carries with it a high risk of complications and morbidity.  Serious etiology was considered. Ddx includes but is not limited to: Disequilibrium, peripheral or central vertigo, vascular abnormality, induration, arrhythmia, etc.  Complete initial physical exam performed, notably the patient was in no acute stress, neuroexam is nonfocal.    Reviewed and confirmed nursing documentation for past medical history, family  history, social history.  Vital signs reviewed.      Clinical Course as of 05/29/23 2358  Wynelle Link May 29, 2023  2218 Dizziness has resolved  [SG]  2218 CTA stable, no critical stenosis or ICH, agree w/ radiologist  [SG]  2318 CXR wnl [SG]    Clinical Course User Index [SG] Jerry Leiter, DO    Brief summary: 77 year old male with history as above here with dizziness, spinning sensation.  Symptoms have improved since the onset.  Not associated with focal neurologic deficit, tinnitus or hearing loss.  No headache.  No head injuries or recent trauma.  Provoked by head turning or ambulation.  Labs and imaging are stable.  Feeling better after fluids; ambulatory without difficulty.  Symptoms have resolved in their entirety  Patient presents with vertigo. On initial evaluation patient appears in no acute distress, afebrile with normal vital signs. Vertigo most suggestive of peripheral cause. Neuro intact without sign of CNS ischemia or other serious etiology. DC on Meclizine with close PCP F/U. Warnings discussed.   Patient in no distress and overall condition improved here in the ED. Detailed discussions were had with the patient regarding current findings, and need for close f/u with PCP or on call doctor. The patient has been instructed to return immediately if the symptoms worsen in any way for re-evaluation. Patient verbalized understanding and is in agreement with current care plan. All questions answered prior to discharge.            Additional history obtained: -Additional history obtained from family -External records from outside source obtained and reviewed including: Chart review including previous notes, labs, imaging, consultation notes including  Primary care documentation Home meds Prior labs   Lab Tests: -I ordered, reviewed, and  interpreted labs.   The pertinent results include:   Labs Reviewed  BASIC METABOLIC PANEL - Abnormal; Notable for the following  components:      Result Value   Glucose, Bld 169 (*)    All other components within normal limits  CBG MONITORING, ED - Abnormal; Notable for the following components:   Glucose-Capillary 146 (*)    All other components within normal limits  CBC  URINALYSIS, ROUTINE W REFLEX MICROSCOPIC  CBG MONITORING, ED    Notable for labs stable  EKG   EKG Interpretation Date/Time:  Sunday May 29 2023 19:25:44 EST Ventricular Rate:  85 PR Interval:  172 QRS Duration:  80 QT Interval:  376 QTC Calculation: 447 R Axis:   30  Text Interpretation: Normal sinus rhythm Cannot rule out Anterior infarct , age undetermined Abnormal ECG When compared with ECG of 25-Jul-2017 22:44, PREVIOUS ECG IS PRESENT Confirmed by Tanda Rockers ([id number]) on 05/29/2023 9:18:09 PM         Imaging Studies ordered: I ordered imaging studies including CTA CXR I independently visualized the following imaging with scope of interpretation limited to determining acute life threatening conditions related to emergency care; findings noted above I independently visualized and interpreted imaging. I agree with the radiologist interpretation   Medicines ordered and prescription drug management: Meds ordered this encounter  Medications   sodium chloride 0.9 % bolus 1,000 mL   iohexol (OMNIPAQUE) 350 MG/ML injection 75 mL   meclizine (ANTIVERT) 25 MG tablet    Sig: Take 1 tablet (25 mg total) by mouth 2 (two) times daily as needed for dizziness.    Dispense:  10 tablet    Refill:  0    -I have reviewed the patients home medicines and have made adjustments as needed   Consultations Obtained: na   Cardiac Monitoring: Continuous pulse oximetry interpreted by myself, 97% on RA.    Social Determinants of Health:  Diagnosis or treatment significantly limited by social determinants of health: lives at home   Reevaluation: After the interventions noted above, I reevaluated the patient and found that they have  resolved  Co morbidities that complicate the patient evaluation  Past Medical History:  Diagnosis Date   Adenomatous polyps    Arthritis    generalized- spine   Borderline systolic HTN    BPH (benign prostatic hyperplasia)    elevated PSA, Dr Annabell Howells   CAD (coronary artery disease)    Cataract    bilateral sx   Environmental allergies    GERD (gastroesophageal reflux disease)    NOT on meds/hx of   Hyperlipidemia    on meds   Seasonal allergies    PRN meds      Dispostion: Disposition decision including need for hospitalization was considered, and patient discharged from emergency department.    Final Clinical Impression(s) / ED Diagnoses Final diagnoses:  Dizziness  Vertigo        Jerry Leiter, DO 05/29/23 2358

## 2023-05-29 NOTE — Discharge Instructions (Addendum)
It was a pleasure caring for you today in the emergency department. ° °Please return to the emergency department for any worsening or worrisome symptoms. ° ° °

## 2023-05-29 NOTE — ED Notes (Signed)
Patient ambulated in ED hallways without difficulty. Patient denies any further complaints of dizziness. Gait strong and steady

## 2023-05-29 NOTE — ED Triage Notes (Signed)
PT self ambulated to triage c/o dizziness and blurred vision at 18:00. Pt states he was driving in car 1.5 hours and when he arrived home was "light headed wobbly with blurred vision" PT denies fever CP SOB. PT a/o x 4 VSS NAD on room air. NIH 0 at baseline.

## 2023-06-10 DIAGNOSIS — H35372 Puckering of macula, left eye: Secondary | ICD-10-CM | POA: Diagnosis not present

## 2023-06-10 DIAGNOSIS — H02834 Dermatochalasis of left upper eyelid: Secondary | ICD-10-CM | POA: Diagnosis not present

## 2023-06-10 DIAGNOSIS — H02831 Dermatochalasis of right upper eyelid: Secondary | ICD-10-CM | POA: Diagnosis not present

## 2023-06-10 DIAGNOSIS — H43813 Vitreous degeneration, bilateral: Secondary | ICD-10-CM | POA: Diagnosis not present

## 2023-06-10 DIAGNOSIS — Z961 Presence of intraocular lens: Secondary | ICD-10-CM | POA: Diagnosis not present

## 2023-06-10 DIAGNOSIS — H35361 Drusen (degenerative) of macula, right eye: Secondary | ICD-10-CM | POA: Diagnosis not present

## 2023-07-06 ENCOUNTER — Telehealth: Payer: Self-pay

## 2023-07-06 DIAGNOSIS — R7309 Other abnormal glucose: Secondary | ICD-10-CM

## 2023-07-06 DIAGNOSIS — E785 Hyperlipidemia, unspecified: Secondary | ICD-10-CM

## 2023-07-06 NOTE — Telephone Encounter (Signed)
 Copied from CRM [id number]. Topic: Clinical - Request for Lab/Test Order >> Jul 06, 2023 12:53 PM Allyne Areola wrote: Reason for CRM: Patient is scheduled for an office visit with Dr.Tabori on Friday 07/08/2023 and would like for his lab orders to be placed in the system so he can have them done prior. He will be going to [facility] [address] [place] tomorrow to get them done.

## 2023-07-06 NOTE — Telephone Encounter (Signed)
 Labs ordered.  Next time it would be better to give a few days notice to ensure these orders get entered

## 2023-07-06 NOTE — Telephone Encounter (Signed)
 Informed patient

## 2023-07-06 NOTE — Telephone Encounter (Signed)
 Pt requesting you order labs prior to appt Friday so he can have them done tomorrow   Please advise

## 2023-07-06 NOTE — Addendum Note (Signed)
 Addended by: Wendie Diskin E on: 07/06/2023 03:34 PM   Modules accepted: Orders

## 2023-07-07 ENCOUNTER — Other Ambulatory Visit: Payer: Medicare Other

## 2023-07-07 DIAGNOSIS — E785 Hyperlipidemia, unspecified: Secondary | ICD-10-CM

## 2023-07-07 LAB — LIPID PANEL
Cholesterol: 141 mg/dL (ref 0–200)
HDL: 35.1 mg/dL — ABNORMAL LOW (ref >39.00)
LDL Cholesterol: 84 mg/dL (ref 0–99)
NonHDL: 105.8
Total CHOL/HDL Ratio: 4
Triglycerides: 110 mg/dL (ref 0.0–149.0)
VLDL: 22 mg/dL (ref 0.0–40.0)

## 2023-07-07 LAB — BASIC METABOLIC PANEL
BUN: 17 mg/dL (ref 6–23)
CO2: 29 meq/L (ref 19–32)
Calcium: 9.5 mg/dL (ref 8.4–10.5)
Chloride: 105 meq/L (ref 96–112)
Creatinine, Ser: 1.15 mg/dL (ref 0.40–1.50)
GFR: 62.22 mL/min (ref >60.00)
Glucose, Bld: 123 mg/dL — ABNORMAL HIGH (ref 70–99)
Potassium: 4.3 meq/L (ref 3.5–5.1)
Sodium: 142 meq/L (ref 135–145)

## 2023-07-07 LAB — CBC WITH DIFFERENTIAL/PLATELET
Basophils Absolute: 0.1 10*3/uL (ref 0.0–0.1)
Basophils Relative: 1.2 % (ref 0.0–3.0)
Eosinophils Absolute: 0.1 10*3/uL (ref 0.0–0.7)
Eosinophils Relative: 2.2 % (ref 0.0–5.0)
HCT: 44.6 % (ref 39.0–52.0)
Hemoglobin: 14.9 g/dL (ref 13.0–17.0)
Lymphocytes Relative: 23 % (ref 12.0–46.0)
Lymphs Abs: 1.4 10*3/uL (ref 0.7–4.0)
MCHC: 33.5 g/dL (ref 30.0–36.0)
MCV: 89.2 fL (ref 78.0–100.0)
Monocytes Absolute: 0.5 10*3/uL (ref 0.1–1.0)
Monocytes Relative: 8.9 % (ref 3.0–12.0)
Neutro Abs: 3.9 10*3/uL (ref 1.4–7.7)
Neutrophils Relative %: 64.7 % (ref 43.0–77.0)
Platelets: 268 10*3/uL (ref 150.0–400.0)
RBC: 5 Mil/uL (ref 4.22–5.81)
RDW: 13.9 % (ref 11.5–15.5)
WBC: 6.1 10*3/uL (ref 4.0–10.5)

## 2023-07-07 LAB — HEPATIC FUNCTION PANEL
ALT: 24 U/L (ref 0–53)
AST: 23 U/L (ref 0–37)
Albumin: 4.7 g/dL (ref 3.5–5.2)
Alkaline Phosphatase: 34 U/L — ABNORMAL LOW (ref 39–117)
Bilirubin, Direct: 0.1 mg/dL (ref 0.0–0.3)
Total Bilirubin: 0.5 mg/dL (ref 0.2–1.2)
Total Protein: 7.3 g/dL (ref 6.0–8.3)

## 2023-07-07 LAB — TSH: TSH: 1.37 u[IU]/mL (ref 0.35–5.50)

## 2023-07-07 NOTE — Addendum Note (Signed)
Addended by: Eldred Manges on: 07/07/2023 03:35 PM   Modules accepted: Orders

## 2023-07-08 ENCOUNTER — Encounter: Payer: Self-pay | Admitting: Family Medicine

## 2023-07-08 ENCOUNTER — Telehealth: Payer: Self-pay

## 2023-07-08 ENCOUNTER — Ambulatory Visit (INDEPENDENT_AMBULATORY_CARE_PROVIDER_SITE_OTHER): Payer: Medicare Other | Admitting: Family Medicine

## 2023-07-08 ENCOUNTER — Other Ambulatory Visit (INDEPENDENT_AMBULATORY_CARE_PROVIDER_SITE_OTHER): Payer: Medicare Other

## 2023-07-08 VITALS — BP 132/72 | HR 75 | Temp 98.5°F | Ht 74.0 in | Wt 212.4 lb

## 2023-07-08 DIAGNOSIS — E785 Hyperlipidemia, unspecified: Secondary | ICD-10-CM

## 2023-07-08 DIAGNOSIS — R7309 Other abnormal glucose: Secondary | ICD-10-CM

## 2023-07-08 LAB — HEMOGLOBIN A1C: Hgb A1c MFr Bld: 6.1 % (ref 4.6–6.5)

## 2023-07-08 NOTE — Telephone Encounter (Signed)
-----   Message from Neena Rhymes sent at 07/08/2023 12:31 PM EST ----- No evidence of diabetes but you are just within the prediabetes range.  Try and follow a low carb diet and get regular physical activity.

## 2023-07-08 NOTE — Patient Instructions (Signed)
Schedule your complete physical in 6 months We'll let you know about the A1C when it comes in [place] of labs look great! Call with any questions or concerns Stay Safe!  Stay Healthy! Happy New Year!

## 2023-07-08 NOTE — Progress Notes (Signed)
   Subjective:    Patient ID: JONNATAN APPLEBY, male    DOB: 07-31-1945, 78 y.o.   MRN: [id number]  HPI Hyperlipidemia- chronic problem, on Crestor 10mg  daily and Fenofibrate 160mg  daily.  Denies CP, SOB, HA's, abd pain, N/V.  No regular exercise.  LDL 84, Trigs 110.  Elevated glucose- sugar was elevated at 123.  We added an A1C.   Review of Systems For ROS see HPI     Objective:   Physical Exam Vitals reviewed.  Constitutional:      General: He is not in acute distress.    Appearance: Normal appearance. He is well-developed. He is not ill-appearing.  HENT:     Head: Normocephalic and atraumatic.  Eyes:     Extraocular Movements: Extraocular movements intact.     Conjunctiva/sclera: Conjunctivae normal.     Pupils: Pupils are equal, round, and reactive to light.  Neck:     Thyroid: No thyromegaly.  Cardiovascular:     Rate and Rhythm: Normal rate and regular rhythm.     Pulses: Normal pulses.     Heart sounds: Normal heart sounds. No murmur heard. Pulmonary:     Effort: Pulmonary effort is normal. No respiratory distress.     Breath sounds: Normal breath sounds.  Abdominal:     General: Bowel sounds are normal. There is no distension.     Palpations: Abdomen is soft.  Musculoskeletal:     Cervical back: Normal range of motion and neck supple.     Right lower leg: No edema.     Left lower leg: No edema.  Lymphadenopathy:     Cervical: No cervical adenopathy.  Skin:    General: Skin is warm and dry.  Neurological:     General: No focal deficit present.     Mental Status: He is alert and oriented to person, place, and time.     Cranial Nerves: No cranial nerve deficit.  Psychiatric:        Mood and Affect: Mood normal.        Behavior: Behavior normal.           Assessment & Plan:

## 2023-07-08 NOTE — Assessment & Plan Note (Signed)
Chronic problem.  Currently on Crestor and Fenofibrate and labs look great!  No side effects from medication.  No changes at this time.

## 2023-07-08 NOTE — Assessment & Plan Note (Signed)
Sugar is again elevated.  Waiting on add on A1C

## 2023-07-08 NOTE — Telephone Encounter (Signed)
Pt has reviewed labs via MyChart

## 2023-08-02 DIAGNOSIS — L648 Other androgenic alopecia: Secondary | ICD-10-CM | POA: Diagnosis not present

## 2023-08-02 DIAGNOSIS — L570 Actinic keratosis: Secondary | ICD-10-CM | POA: Diagnosis not present

## 2023-08-02 DIAGNOSIS — L821 Other seborrheic keratosis: Secondary | ICD-10-CM | POA: Diagnosis not present

## 2023-08-02 DIAGNOSIS — D225 Melanocytic nevi of trunk: Secondary | ICD-10-CM | POA: Diagnosis not present

## 2023-08-02 DIAGNOSIS — L820 Inflamed seborrheic keratosis: Secondary | ICD-10-CM | POA: Diagnosis not present

## 2023-08-02 DIAGNOSIS — X32XXXD Exposure to sunlight, subsequent encounter: Secondary | ICD-10-CM | POA: Diagnosis not present

## 2023-08-30 DIAGNOSIS — K080 Exfoliation of teeth due to systemic causes: Secondary | ICD-10-CM | POA: Diagnosis not present

## 2023-09-13 ENCOUNTER — Encounter: Payer: Self-pay | Admitting: Family Medicine

## 2023-09-13 ENCOUNTER — Ambulatory Visit (INDEPENDENT_AMBULATORY_CARE_PROVIDER_SITE_OTHER): Admitting: Family Medicine

## 2023-09-13 ENCOUNTER — Ambulatory Visit: Payer: Self-pay | Admitting: Family Medicine

## 2023-09-13 VITALS — BP 136/60 | HR 110 | Temp 99.7°F | Resp 16 | Ht 74.0 in

## 2023-09-13 DIAGNOSIS — R509 Fever, unspecified: Secondary | ICD-10-CM

## 2023-09-13 DIAGNOSIS — J069 Acute upper respiratory infection, unspecified: Secondary | ICD-10-CM | POA: Diagnosis not present

## 2023-09-13 LAB — POCT INFLUENZA A/B
Influenza A, POC: NEGATIVE
Influenza B, POC: NEGATIVE

## 2023-09-13 LAB — POC COVID19 BINAXNOW: SARS Coronavirus 2 Ag: NEGATIVE

## 2023-09-13 MED ORDER — BENZONATATE 100 MG PO CAPS: 100.0000 mg | ORAL_CAPSULE | Freq: Two times a day (BID) | ORAL | 0 refills | Status: AC | PRN

## 2023-09-13 MED ORDER — IPRATROPIUM BROMIDE 0.06 % NA SOLN: 2.0000 | Freq: Four times a day (QID) | NASAL | 0 refills | Status: DC

## 2023-09-13 NOTE — Progress Notes (Signed)
 ACUTE VISIT Chief Complaint  Patient presents with   Fever   Nasal Congestion   Cough    Started last night   HPI: Jerry Simpson is a 78 y.o. male with a PMHx significant for GERD, bladder tumor, allergic rhinitis, coronary atherosclerosis, skin cancer, and HLD, among some, who is here today with his wife complaining of URI symptoms.   Patient complains of productive cough, fever (as high as 104-106 on mercury thermometer), chills, congestion, rhinorrhea, postnasal drainage, lightheadedness, and some fatigue when active since last night.   He has been taking a combination of acetaminophen and ibuprofen every 4 hours. He has not been using his inhaler.  He has also tried some allegra and xyzal for allergy rhinitis.  Tested negative for Covid and Flu in the office today.   Pertinent negatives include body aches, sore throat,SOB, wheezing, hemoptysis,nausea, vomiting, diarrhea, or known sick contacts.   Review of Systems  Constitutional:  Positive for activity change, appetite change and fatigue.  HENT:  Negative for ear pain, facial swelling, sinus pain, trouble swallowing and voice change.   Eyes:  Negative for discharge and redness.  Respiratory:  Negative for stridor.   Cardiovascular:  Negative for chest pain and leg swelling.  Gastrointestinal:  Negative for abdominal distention and abdominal pain.  Genitourinary:  Negative for decreased urine volume, dysuria and hematuria.  Skin:  Negative for rash.  Allergic/Immunologic: Positive for environmental allergies.  Neurological:  Positive for headaches. Negative for syncope, facial asymmetry and weakness.  Psychiatric/Behavioral:  Negative for confusion and hallucinations.   See other pertinent positives and negatives in HPI.  Current Outpatient Medications on File Prior to Visit  Medication Sig Dispense Refill   albuterol (VENTOLIN HFA) 108 (90 Base) MCG/ACT inhaler Inhale 2 puffs into the lungs every 6 (six) hours as  needed for wheezing or shortness of breath. 8 g 0   aspirin EC 81 MG tablet Take 1 tablet by mouth daily.     BIOTIN PO Take 1 tablet by mouth daily.     Coenzyme Q10 (CO Q-10) 100 MG CAPS Take 1 capsule by mouth daily.     cyanocobalamin 2000 MCG tablet Take 2,000 mcg by mouth daily.     fenofibrate 160 MG tablet Take 1 tablet (160 mg total) by mouth daily. 90 tablet 1   fexofenadine (ALLEGRA) 180 MG tablet Take 180 mg by mouth daily as needed for allergies.      finasteride (PROPECIA) 1 MG tablet Take 0.5 tablets by mouth daily.     fluticasone (FLONASE) 50 MCG/ACT nasal spray Place 2 sprays into both nostrils daily. (Patient taking differently: Place 2 sprays into both nostrils daily as needed.) 16 g 1   folic acid (FOLVITE) 1 MG tablet Take 1 mg by mouth daily.     guaiFENesin-codeine (ROBITUSSIN AC) 100-10 MG/5ML syrup Take 10 mLs by mouth 3 (three) times daily as needed for cough. 120 mL 0   meclizine (ANTIVERT) 25 MG tablet Take 1 tablet (25 mg total) by mouth 2 (two) times daily as needed for dizziness. 10 tablet 0   rosuvastatin (CRESTOR) 10 MG tablet Take 1 tablet (10 mg total) by mouth daily. 90 tablet 1   No current facility-administered medications on file prior to visit.    Past Medical History:  Diagnosis Date   Adenomatous polyps    Arthritis    generalized- spine   Borderline systolic HTN    BPH (benign prostatic hyperplasia)    elevated PSA,  Dr Annabell Howells   CAD (coronary artery disease)    Cataract    bilateral sx   Environmental allergies    GERD (gastroesophageal reflux disease)    NOT on meds/hx of   Hyperlipidemia    on meds   Seasonal allergies    PRN meds   Allergies  Allergen Reactions   Sulfonamide Derivatives Rash    RASH  Because of a history of documented adverse serious drug reaction;Medi Alert bracelet  is recommended   Claritin [Loratadine]     Pt states it affects his prostate.    Minoxidil Hives and Rash   Sulfa Antibiotics Rash    Social  History   Socioeconomic History   Marital status: Married    Spouse name: Not on file   Number of children: Not on file   Years of education: Not on file   Highest education level: Not on file  Occupational History   Not on file  Tobacco Use   Smoking status: Never   Smokeless tobacco: Never  Vaping Use   Vaping status: Never Used  Substance and Sexual Activity   Alcohol use: Yes    Alcohol/week: 3.0 standard drinks of alcohol    Types: 3 drink(s) per week    Comment:  < 7/ week   Drug use: No   Sexual activity: Not Currently  Other Topics Concern   Not on file  Social History Narrative   Lives with wife.     Social Drivers of Corporate investment banker Strain: Low Risk  (12/15/2022)   Overall Financial Resource Strain (CARDIA)    Difficulty of Paying Living Expenses: Not hard at all  Food Insecurity: No Food Insecurity (12/15/2022)   Hunger Vital Sign    Worried About Running Out of Food in the Last Year: Never true    Ran Out of Food in the Last Year: Never true  Transportation Needs: No Transportation Needs (12/15/2022)   PRAPARE - Administrator, Civil Service (Medical): No    Lack of Transportation (Non-Medical): No  Physical Activity: Inactive (12/15/2022)   Exercise Vital Sign    Days of Exercise per Week: 0 days    Minutes of Exercise per Session: 0 min  Stress: No Stress Concern Present (12/15/2022)   [employer] of Occupational Health - Occupational Stress Questionnaire    Feeling of Stress : Not at all  Social Connections: Socially Integrated (12/15/2022)   Social Connection and Isolation Panel [NHANES]    Frequency of Communication with Friends and Family: More than three times a week    Frequency of Social Gatherings with Friends and Family: More than three times a week    Attends Religious Services: More than 4 times per year    Active Member of [employer] or Organizations: Yes    Attends Banker Meetings: More than 4 times per  year    Marital Status: Married    Vitals:   09/13/23 1429  BP: 136/60  Pulse: (!) 110  Resp: 16  Temp: 99.7 F (37.6 C)  SpO2: 96%   Body mass index is 27.27 kg/m.  Physical Exam Vitals and nursing note reviewed.  Constitutional:      General: He is not in acute distress.    Appearance: He is well-developed. He is not ill-appearing.  HENT:     Head: Normocephalic and atraumatic.     Nose: Rhinorrhea present.     Right Sinus: No maxillary sinus tenderness or frontal sinus  tenderness.     Left Sinus: No maxillary sinus tenderness or frontal sinus tenderness.     Mouth/Throat:     Mouth: Mucous membranes are moist.     Pharynx: Oropharynx is clear. Uvula midline.  Eyes:     Conjunctiva/sclera: Conjunctivae normal.  Cardiovascular:     Rate and Rhythm: Regular rhythm. Tachycardia present.     Heart sounds: Murmur (Soft SEM LUSB) heard.  Pulmonary:     Effort: Pulmonary effort is normal. No respiratory distress.     Breath sounds: Normal breath sounds. No stridor. No wheezing.  Lymphadenopathy:     Head:     Right side of head: No submandibular adenopathy.     Left side of head: No submandibular adenopathy.     Cervical: No cervical adenopathy.  Skin:    General: Skin is warm.     Findings: No erythema or rash.  Neurological:     General: No focal deficit present.     Mental Status: He is alert and oriented to person, place, and time.     Gait: Gait normal.  Psychiatric:        Mood and Affect: Mood and affect normal.   ASSESSMENT AND PLAN:  Mr. Covalt was seen today for fever and congestion.   Fever, unspecified Temp repeated 101.7 F. We discussed possible etiologies, most likely viral. Lung auscultation negative, so I do not think imaging or blood work are needed today. Monitor for new symptoms.  -     POC COVID-19 BinaxNow -     POCT Influenza A/B  URI, acute History and examination suggest a viral illness. Here in the office COVID-19 and rapid flu  negative. He started with symptoms last night, so recommend monitoring for new symptoms. Continue monitoring temp, he states that he is having some difficulty reading Mercury thermometer. If 106 F temp he needs to be evaluated in the ER. Recommend repeating COVID and flu test tomorrow morning and to let us know if he gets a positive test. Adequate hydration and bed rest. Caution with NSAIDs. Recommend benzonatate for cough management and Atrovent nasal spray for nasal congestion as well as nasal saline irrigations as needed. He was clearly instructed about warning signs.  -     Benzonatate; Take 1 capsule (100 mg total) by mouth 2 (two) times daily as needed for up to 10 days.  Dispense: 20 capsule; Refill: 0 -     Ipratropium Bromide; Place 2 sprays into both nostrils 4 (four) times daily.  Dispense: 15 mL; Refill: 0   Return if symptoms worsen or fail to improve.  I, Rolla Etienne Wierda, acting as a scribe for Kaimana Neuzil [place], MD., have documented all relevant documentation on the behalf of Djimon Lundstrom [place], MD, as directed by  Aalia Greulich [place], MD while in the presence of Nykayla Marcelli [place], MD.   I, Cailynn Bodnar [place], MD, have reviewed all documentation for this visit. The documentation on 09/13/23 for the exam, diagnosis, procedures, and orders are all accurate and complete.  Ellene Bloodsaw G. [place], MD  [facility]. [place] office.

## 2023-09-13 NOTE — Telephone Encounter (Signed)
 Chief Complaint: Congestion  Symptoms: 102F currently, 95% O2 sat currently, difficulty breathing, productive cough, clear mucus Frequency: Ongoing since yesterday afternoon Pertinent Negatives: Patient denies chest pain  Disposition: [x] Appointment(In office)  Additional Notes: Pt scheduled for appt in office at a different location than PCP office due to no appt availability today. This RN educated pt on home care, new-worsening symptoms, when to call back/seek emergent care. Pt verbalized understanding and agrees to plan.   Copied from CRM [id number]. Topic: Clinical - Red Word Triage >> Sep 13, 2023 12:17 PM [place] E wrote: [facility] that prompted transfer to Nurse Triage: Patient had 102 fever last night. Sinus drainage, head pressure, and his eyes are burning. Symptoms started last night 3/24. Reason for Disposition  [1] MILD difficulty breathing (e.g., minimal/no SOB at rest, SOB with walking, pulse <100) AND [2] new-onset  Answer Assessment - Initial Assessment Questions Mild difficulty breathing Onset yesterday afternoon O2 saturation currently 95% Denies chest pain  Protocols used: Oxygen Monitoring and Hypoxia-A-AH

## 2023-09-13 NOTE — Patient Instructions (Addendum)
 A few things to remember from today's visit:  Fever, unspecified - Plan: POC COVID-19, POCT Influenza A/B  URI, acute - Plan: benzonatate (TESSALON) 100 MG capsule, ipratropium (ATROVENT) 0.06 % nasal spray  Repeat covid and flu test at home tomorrow and let me know if positive. Continue symptomatic treatment. Monitor for new symptoms.  Do not use My Chart to request refills or for acute issues that need immediate attention. If you send a my chart message, it may take a few days to be addressed, specially if I am not in the office.  Please be sure medication list is accurate. If a new problem present, please set up appointment sooner than planned today.

## 2023-09-13 NOTE — Telephone Encounter (Signed)
 Noted.

## 2023-09-14 ENCOUNTER — Other Ambulatory Visit: Payer: Self-pay | Admitting: Family Medicine

## 2023-09-14 ENCOUNTER — Telehealth: Payer: Self-pay

## 2023-09-14 DIAGNOSIS — J101 Influenza due to other identified influenza virus with other respiratory manifestations: Secondary | ICD-10-CM

## 2023-09-14 MED ORDER — OSELTAMIVIR PHOSPHATE 75 MG PO CAPS: 75.0000 mg | ORAL_CAPSULE | Freq: Two times a day (BID) | ORAL | 0 refills | Status: AC

## 2023-09-14 NOTE — Telephone Encounter (Signed)
 Tamiflu 75 mg sent to his pharmacy to take twice daily, start today. It is okay to take Mucinex with antiviral medication. Thanks, BJ

## 2023-09-14 NOTE — Telephone Encounter (Signed)
 I called and spoke with pt, he is aware of message below and verbalized understanding.

## 2023-09-14 NOTE — Telephone Encounter (Signed)
 Copied from CRM [id number]. Topic: Clinical - Medical Advice >> Sep 14, 2023  2:26 PM [place] wrote: Reason for CRM: Patient took an at home test for Flu and tested positive. Also fevers are down. Feeling a lot better. Patient is also taking Mucinex, wanting to know if that is okay on top of the antibiotic....please reach out to patient

## 2023-09-30 ENCOUNTER — Ambulatory Visit: Payer: Self-pay

## 2023-09-30 ENCOUNTER — Other Ambulatory Visit: Payer: Self-pay

## 2023-09-30 MED ORDER — PREDNISONE 10 MG PO TABS: ORAL_TABLET | ORAL | 0 refills | Status: DC

## 2023-09-30 MED ORDER — PREDNISONE 10 MG PO TABS
ORAL_TABLET | ORAL | 0 refills | Status: DC
Start: 2023-09-30 — End: 2023-09-30

## 2023-09-30 NOTE — Telephone Encounter (Signed)
 Will send in prednisone taper for pt.  If no improvement after medication, will need appt.

## 2023-09-30 NOTE — Telephone Encounter (Signed)
 Sent in rx Pt has been notifed

## 2023-09-30 NOTE — Telephone Encounter (Signed)
 Copied from CRM [id number]. Topic: Clinical - Red Word Triage >> Sep 30, 2023 11:28 AM Mackie Pai E wrote: [facility] that prompted transfer to Nurse Triage: Patient has had a cough for the past month. Worsening cough, states it is a "chronic cough."  Chief Complaint: cough Symptoms: dry nagging non productive cough Frequency: x months and worsening Pertinent Negatives: Patient denies fever Disposition: [] ED /[] Urgent Care (no appt availability in office) / [] Appointment(In office/virtual)/ []  [facility]/ [] Home Care/ [x] Refused Recommended Disposition /[] [facility]/ []  Follow-up with PCP Additional Notes: several months ago cough started & was having a lot of sputum coming up.  Later sputum decreased & cough got better. On 09/13/2023 diagnosed with Type A flu - cough is back but is dry nagging cough that will not go away.  Would like to see if PCP can prescribe dose pack for cough: has been prescribed this in the past and it worked.  If not able to call something in then please schedule an appt for in office visit.  Nurse offered to schedule appt 1st: pt refused: stated he wanted to see PCP would call something in before scheduling.  Reason for Disposition  Cough has been present for > 3 weeks  Answer Assessment - Initial Assessment Questions 1. ONSET: "When did the cough begin?"      X months 2. SEVERITY: "How bad is the cough today?"      Nagging cough 3. SPUTUM: "Describe the color of your sputum" (none, dry cough; clear, white, yellow, green)     dry 4. HEMOPTYSIS: "Are you coughing up any blood?" If so ask: "How much?" (flecks, streaks, tablespoons, etc.)     no 5. DIFFICULTY BREATHING: "Are you having difficulty breathing?" If Yes, ask: "How bad is it?" (e.g., mild, moderate, severe)    - MILD: No SOB at rest, mild SOB with walking, speaks normally in sentences, can lie down, no retractions, pulse < 100.    - MODERATE: SOB at rest, SOB with minimal exertion and  prefers to sit, cannot lie down flat, speaks in phrases, mild retractions, audible wheezing, pulse 100-120.    - SEVERE: Very SOB at rest, speaks in single words, struggling to breathe, sitting hunched forward, retractions, pulse > 120      SOB at times 6. FEVER: "Do you have a fever?" If Yes, ask: "What is your temperature, how was it measured, and when did it start?"     no 7. CARDIAC HISTORY: "Do you have any history of heart disease?" (e.g., heart attack, congestive heart failure)      N/a 8. LUNG HISTORY: "Do you have any history of lung disease?"  (e.g., pulmonary embolus, asthma, emphysema)     N/a 9. PE RISK FACTORS: "Do you have a history of blood clots?" (or: recent major surgery, recent prolonged travel, bedridden)     N/a 10. OTHER SYMPTOMS: "Do you have any other symptoms?" (e.g., runny nose, wheezing, chest pain)       no 11. PREGNANCY: "Is there any chance you are pregnant?" "When was your last menstrual period?"       N/a 12. TRAVEL: "Have you traveled out of the country in the last month?" (e.g., travel history, exposures)       no  Protocols used: Cough - Acute Productive-A-AH

## 2023-09-30 NOTE — Telephone Encounter (Signed)
 Can someone please send in the Prednisone prescription for him?  I entered it, tried to sign it, and it says I'm not an 'authorized prescriber' b/c it's in an [place] note and I can't figure this out

## 2023-09-30 NOTE — Addendum Note (Signed)
 Addended by: Sheliah Hatch on: 09/30/2023 12:42 PM   Modules accepted: Orders

## 2023-10-04 ENCOUNTER — Encounter: Payer: Self-pay | Admitting: Family Medicine

## 2023-10-04 ENCOUNTER — Ambulatory Visit (INDEPENDENT_AMBULATORY_CARE_PROVIDER_SITE_OTHER): Admitting: Family Medicine

## 2023-10-04 VITALS — BP 128/68 | HR 69 | Temp 98.6°F | Ht 74.0 in | Wt 206.4 lb

## 2023-10-04 DIAGNOSIS — R0602 Shortness of breath: Secondary | ICD-10-CM

## 2023-10-04 DIAGNOSIS — R051 Acute cough: Secondary | ICD-10-CM

## 2023-10-04 DIAGNOSIS — J208 Acute bronchitis due to other specified organisms: Secondary | ICD-10-CM

## 2023-10-04 MED ORDER — FLUTICASONE-SALMETEROL 250-50 MCG/ACT IN AEPB: 1.0000 | INHALATION_SPRAY | Freq: Two times a day (BID) | RESPIRATORY_TRACT | 1 refills | Status: DC

## 2023-10-04 MED ORDER — PREDNISONE 20 MG PO TABS: 40.0000 mg | ORAL_TABLET | Freq: Every day | ORAL | 0 refills | Status: AC

## 2023-10-04 NOTE — Patient Instructions (Signed)
 I have sent in Wixela to your pharmacy for you to add to your regimen.   May use this one puff twice a day to help with cough and wheezing.  I have sent in prednisone for you to take 2 tablets once daily in the morning with breakfast for the next 5 days.  Follow-up with me for new or worsening symptoms.

## 2023-10-04 NOTE — Progress Notes (Signed)
 Acute Office Visit  Subjective:     Patient ID: Jerry Simpson, male    DOB: 1946/03/13, 78 y.o.   MRN: [id number]  Chief Complaint  Patient presents with   Cough    Constant cough, shortness of breath for several months. Cough present especially when talking. Started as productive cough (clear phlegm), does note of some seasonal allergies. Also recently had recovered from the flu (March 09/15/23). Currently treating with prednisone dosepack prescribed by Dr.Tabori and xyzal. Intermittently using albuterol inhaler and fluticasone spray    HPI Patient is in today for evaluation of dry cough, for the last 2 weeks. Had positive flu A from September 15, 2023. Has been prescribed prednisone Dosepak by Dr. Beverely Low, Elita Boone.  States this is not really helping him. Reports that the only symptom lingering is the dry cough, shortness of breath. Denies abdominal pain, nausea, vomiting, diarrhea, rash, fever, chills, other symptoms.  Medical hx as outlined below.  ROS Per HPI      Objective:    BP 128/68   Pulse 69   Temp 98.6 F (37 C)   Ht 6\' 2"  (1.88 m)   Wt 206 lb 6.4 oz (93.6 kg)   SpO2 98%   BMI 26.50 kg/m    Physical Exam Vitals and nursing note reviewed.  Constitutional:      General: He is not in acute distress.    Comments: Appears fatigued  HENT:     Head: Normocephalic and atraumatic.     Right Ear: Tympanic membrane and ear canal normal.     Left Ear: Tympanic membrane and ear canal normal.     Mouth/Throat:     Mouth: Mucous membranes are moist.     Pharynx: Oropharynx is clear. No oropharyngeal exudate or posterior oropharyngeal erythema.     Comments: Oropharyngeal cobblestoning   Eyes:     Extraocular Movements: Extraocular movements intact.  Cardiovascular:     Rate and Rhythm: Normal rate and regular rhythm.     Heart sounds: Normal heart sounds.  Pulmonary:     Effort: Pulmonary effort is normal. No respiratory distress.     Breath sounds: No wheezing,  rhonchi or rales.     Comments: Dry cough  Musculoskeletal:     Cervical back: Normal range of motion.  Lymphadenopathy:     Cervical: No cervical adenopathy.  Neurological:     General: No focal deficit present.     Mental Status: He is alert and oriented to person, place, and time.    No results found for any visits on 10/04/23.      Assessment & Plan:   Acute bronchitis due to other specified organisms -     Fluticasone-Salmeterol; Inhale 1 puff into the lungs in the morning and at bedtime.  Dispense: 60 each; Refill: 1 -     predniSONE; Take 2 tablets (40 mg total) by mouth daily for 5 days.  Dispense: 10 tablet; Refill: 0  Acute cough -     Fluticasone-Salmeterol; Inhale 1 puff into the lungs in the morning and at bedtime.  Dispense: 60 each; Refill: 1 -     predniSONE; Take 2 tablets (40 mg total) by mouth daily for 5 days.  Dispense: 10 tablet; Refill: 0  SOB (shortness of breath) -     Fluticasone-Salmeterol; Inhale 1 puff into the lungs in the morning and at bedtime.  Dispense: 60 each; Refill: 1 -     predniSONE; Take 2 tablets (40 mg total)  by mouth daily for 5 days.  Dispense: 10 tablet; Refill: 0  STOP current pred taper START pred 40mg  every day x 5 days Wixela to pharmacy, one puff BID Use albuterol inhaler as needed  Continue xyzal  Meds ordered this encounter  Medications   fluticasone-salmeterol (WIXELA INHUB) 250-50 MCG/ACT AEPB    Sig: Inhale 1 puff into the lungs in the morning and at bedtime.    Dispense:  60 each    Refill:  1   predniSONE (DELTASONE) 20 MG tablet    Sig: Take 2 tablets (40 mg total) by mouth daily for 5 days.    Dispense:  10 tablet    Refill:  0    Return if symptoms worsen or fail to improve.  Wellington Half, FNP

## 2023-10-21 ENCOUNTER — Other Ambulatory Visit: Payer: Self-pay | Admitting: Family Medicine

## 2023-10-21 DIAGNOSIS — E785 Hyperlipidemia, unspecified: Secondary | ICD-10-CM

## 2023-10-21 NOTE — Telephone Encounter (Signed)
 Copied from CRM [id number]. Topic: Clinical - Medication Refill >> Oct 21, 2023  5:02 PM Jerry Simpson P wrote: Most Recent Primary Care Visit:  Provider: Wellington Half  Department: [facility]  Visit Type: ACUTE  Date: 10/04/2023  Medication: rosuvastatin  (CRESTOR ) 10 MG tablet  Has the patient contacted their pharmacy? Yes (Agent: If no, request that the patient contact the pharmacy for the refill. If patient does not wish to contact the pharmacy document the reason why and proceed with request.) (Agent: If yes, when and what did the pharmacy advise?) [facility] refills  Is this the correct pharmacy for this prescription? Yes If no, delete pharmacy and type the correct one.  This is the patient's preferred pharmacy:  [facility] [place], [place] - [address] Jerry Simpson [place] [address] Phone: [phone] Fax: [phone]   Has the prescription been filled recently? No  Is the patient out of the medication? Yes  Has the patient been seen for an appointment in the last year OR does the patient have an upcoming appointment? Yes  Can we respond through MyChart? Yes  Agent: Please be advised that Rx refills may take up to 3 business days. We ask that you follow-up with your pharmacy.

## 2023-10-24 MED ORDER — ROSUVASTATIN CALCIUM 10 MG PO TABS: 10.0000 mg | ORAL_TABLET | Freq: Every day | ORAL | 1 refills | Status: DC

## 2023-10-27 ENCOUNTER — Other Ambulatory Visit: Payer: Self-pay

## 2023-10-27 ENCOUNTER — Emergency Department (HOSPITAL_BASED_OUTPATIENT_CLINIC_OR_DEPARTMENT_OTHER): Admitting: Radiology

## 2023-10-27 ENCOUNTER — Encounter (HOSPITAL_BASED_OUTPATIENT_CLINIC_OR_DEPARTMENT_OTHER): Payer: Self-pay

## 2023-10-27 ENCOUNTER — Emergency Department (HOSPITAL_BASED_OUTPATIENT_CLINIC_OR_DEPARTMENT_OTHER)

## 2023-10-27 ENCOUNTER — Emergency Department (HOSPITAL_BASED_OUTPATIENT_CLINIC_OR_DEPARTMENT_OTHER)
Admission: EM | Admit: 2023-10-27 | Discharge: 2023-10-27 | Disposition: A | Discharge status: Home / Self Care | Attending: Emergency Medicine | Admitting: Emergency Medicine

## 2023-10-27 DIAGNOSIS — Y92410 Unspecified street and highway as the place of occurrence of the external cause: Secondary | ICD-10-CM | POA: Insufficient documentation

## 2023-10-27 DIAGNOSIS — R109 Unspecified abdominal pain: Secondary | ICD-10-CM | POA: Diagnosis not present

## 2023-10-27 DIAGNOSIS — S3993XA Unspecified injury of pelvis, initial encounter: Secondary | ICD-10-CM | POA: Diagnosis not present

## 2023-10-27 DIAGNOSIS — Z7982 Long term (current) use of aspirin: Secondary | ICD-10-CM | POA: Diagnosis not present

## 2023-10-27 DIAGNOSIS — I700 Atherosclerosis of aorta: Secondary | ICD-10-CM | POA: Diagnosis not present

## 2023-10-27 DIAGNOSIS — S3991XA Unspecified injury of abdomen, initial encounter: Secondary | ICD-10-CM | POA: Diagnosis not present

## 2023-10-27 DIAGNOSIS — M549 Dorsalgia, unspecified: Secondary | ICD-10-CM | POA: Insufficient documentation

## 2023-10-27 DIAGNOSIS — R0781 Pleurodynia: Secondary | ICD-10-CM | POA: Diagnosis not present

## 2023-10-27 DIAGNOSIS — S299XXA Unspecified injury of thorax, initial encounter: Secondary | ICD-10-CM | POA: Diagnosis not present

## 2023-10-27 DIAGNOSIS — I517 Cardiomegaly: Secondary | ICD-10-CM | POA: Diagnosis not present

## 2023-10-27 LAB — CBC WITH DIFFERENTIAL/PLATELET
Abs Immature Granulocytes: 0.02 10*3/uL (ref 0.00–0.07)
Basophils Absolute: 0 10*3/uL (ref 0.0–0.1)
Basophils Relative: 0 %
Eosinophils Absolute: 0.1 10*3/uL (ref 0.0–0.5)
Eosinophils Relative: 1 %
HCT: 39.4 % (ref 39.0–52.0)
Hemoglobin: 13 g/dL (ref 13.0–17.0)
Immature Granulocytes: 0 %
Lymphocytes Relative: 19 %
Lymphs Abs: 1.7 10*3/uL (ref 0.7–4.0)
MCH: 29.5 pg (ref 26.0–34.0)
MCHC: 33 g/dL (ref 30.0–36.0)
MCV: 89.5 fL (ref 80.0–100.0)
Monocytes Absolute: 0.7 10*3/uL (ref 0.1–1.0)
Monocytes Relative: 7 %
Neutro Abs: 6.4 10*3/uL (ref 1.7–7.7)
Neutrophils Relative %: 73 %
Platelets: 233 10*3/uL (ref 150–400)
RBC: 4.4 MIL/uL (ref 4.22–5.81)
RDW: 13 % (ref 11.5–15.5)
WBC: 9 10*3/uL (ref 4.0–10.5)
nRBC: 0 % (ref 0.0–0.2)

## 2023-10-27 LAB — COMPREHENSIVE METABOLIC PANEL WITH GFR
ALT: 29 U/L (ref 0–44)
AST: 33 U/L (ref 15–41)
Albumin: 4.2 g/dL (ref 3.5–5.0)
Alkaline Phosphatase: 39 U/L (ref 38–126)
Anion gap: 10 (ref 5–15)
BUN: 19 mg/dL (ref 8–23)
CO2: 25 mmol/L (ref 22–32)
Calcium: 9.5 mg/dL (ref 8.9–10.3)
Chloride: 105 mmol/L (ref 98–111)
Creatinine, Ser: 1.21 mg/dL (ref 0.61–1.24)
GFR, Estimated: >60 mL/min (ref >60)
Glucose, Bld: 102 mg/dL — ABNORMAL HIGH (ref 70–99)
Potassium: 4 mmol/L (ref 3.5–5.1)
Sodium: 140 mmol/L (ref 135–145)
Total Bilirubin: 0.3 mg/dL (ref 0.0–1.2)
Total Protein: 6.8 g/dL (ref 6.5–8.1)

## 2023-10-27 MED ORDER — OXYCODONE-ACETAMINOPHEN 5-325 MG PO TABS: 1.0000 | ORAL_TABLET | Freq: Four times a day (QID) | ORAL | 0 refills | Status: DC | PRN

## 2023-10-27 MED ORDER — HYDROCODONE-ACETAMINOPHEN 5-325 MG PO TABS
1.0000 | ORAL_TABLET | Freq: Once | ORAL | Status: AC
  Administered 2023-10-27: 1 via ORAL
  Filled 2023-10-27: qty 1

## 2023-10-27 MED ORDER — DIAZEPAM 2 MG PO TABS
2.0000 mg | ORAL_TABLET | Freq: Once | ORAL | Status: AC
  Administered 2023-10-27: 2 mg via ORAL
  Filled 2023-10-27: qty 1

## 2023-10-27 MED ORDER — MORPHINE SULFATE (PF) 4 MG/ML IV SOLN
4.0000 mg | Freq: Once | INTRAVENOUS | Status: AC
  Administered 2023-10-27: 4 mg via INTRAVENOUS
  Filled 2023-10-27: qty 1

## 2023-10-27 MED ORDER — CYCLOBENZAPRINE HCL 10 MG PO TABS: 10.0000 mg | ORAL_TABLET | Freq: Two times a day (BID) | ORAL | 0 refills | Status: AC | PRN

## 2023-10-27 MED ORDER — IOHEXOL 300 MG/ML  SOLN
100.0000 mL | Freq: Once | INTRAMUSCULAR | Status: AC | PRN
  Administered 2023-10-27: 100 mL via INTRAVENOUS

## 2023-10-27 MED ORDER — LIDOCAINE 5 % EX PTCH
1.0000 | MEDICATED_PATCH | CUTANEOUS | Status: DC
Start: 2023-10-27 — End: 2023-10-28
  Administered 2023-10-27: 1 via TRANSDERMAL
  Filled 2023-10-27: qty 1
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
## 2023-10-27 NOTE — Discharge Instructions (Addendum)
 The CT scan of your chest abdomen and pelvis did not show any acute findings.  You were given 2 prescriptions today, Flexeril is a muscle relaxer, please take 1 tablet twice a day for the next 7 days for muscle spasms.  The second medication is Percocet, please take 1 tablet every 6 hours for severe pain.  This medication does contain Tylenol, you may add some Motrin on top of the medication but no Tylenol.
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
## 2023-10-31 ENCOUNTER — Other Ambulatory Visit: Payer: Self-pay

## 2023-10-31 DIAGNOSIS — E785 Hyperlipidemia, unspecified: Secondary | ICD-10-CM

## 2023-11-03 ENCOUNTER — Encounter: Payer: Self-pay | Admitting: Family Medicine

## 2023-11-03 ENCOUNTER — Ambulatory Visit: Admitting: Family Medicine

## 2023-11-03 DIAGNOSIS — R109 Unspecified abdominal pain: Secondary | ICD-10-CM

## 2023-11-03 NOTE — Patient Instructions (Signed)
 Follow up as needed or as scheduled Thank God you are doing better and pain is improving Make sure you allow yourself time to recover Continue the Ibuprofen 800mg  as needed Call with any questions or concerns Stay Safe!  Stay Healthy! Hang in there!!!
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
## 2023-11-06 ENCOUNTER — Encounter: Payer: Self-pay | Admitting: Family Medicine

## 2024-01-02 ENCOUNTER — Telehealth: Payer: Self-pay

## 2024-01-02 NOTE — Telephone Encounter (Signed)
 Called and informed the patient we do need him to come in so we can confirm a diagnosis and treat appropriately, patient voiced understanding

## 2024-01-02 NOTE — Telephone Encounter (Signed)
 Copied from CRM [id number]. Topic: Clinical - Medical Advice >> Jan 02, 2024 12:41 PM Berneda FALCON wrote: Reason for CRM: Pt scheduled an appt for 7/15 but wants to know if we can just  call something in for him instead. He has sinus infection symptoms including drainage and sore throat that began on 7/13. He states Augmentin  usually works for him. If so, this is the preferred pharmacy for him.  [facility] [id number] [place] [place], [place] - [address] [place] [address] Phone: [phone] Fax: [phone] Hours: Not open 24 hours

## 2024-01-03 ENCOUNTER — Ambulatory Visit: Admitting: Student in an Organized Health Care Education/Training Program

## 2024-01-03 ENCOUNTER — Ambulatory Visit (INDEPENDENT_AMBULATORY_CARE_PROVIDER_SITE_OTHER): Admitting: Family Medicine

## 2024-01-03 ENCOUNTER — Encounter: Payer: Self-pay | Admitting: Family Medicine

## 2024-01-03 VITALS — BP 142/78 | HR 85 | Temp 99.0°F | Wt 205.0 lb

## 2024-01-03 DIAGNOSIS — J40 Bronchitis, not specified as acute or chronic: Secondary | ICD-10-CM | POA: Diagnosis not present

## 2024-01-03 MED ORDER — AZITHROMYCIN 250 MG PO TABS: ORAL_TABLET | ORAL | 1 refills | Status: DC

## 2024-01-03 MED ORDER — AZITHROMYCIN 250 MG PO TABS: ORAL_TABLET | ORAL | 0 refills | Status: DC

## 2024-01-03 MED ORDER — ALBUTEROL SULFATE HFA 108 (90 BASE) MCG/ACT IN AERS: 2.0000 | INHALATION_SPRAY | Freq: Four times a day (QID) | RESPIRATORY_TRACT | 0 refills | Status: DC | PRN

## 2024-01-03 NOTE — Progress Notes (Signed)
   Subjective:    Patient ID: Jerry Simpson, male    DOB: [birth year], 78 y.o.   MRN: [id number]  HPI Here for 3 days of PND, ST, chest congestion and coughing up yellow sputum. Some low garde fevers. No wheezing or SOB.    Review of Systems  Constitutional:  Positive for fever.  HENT:  Positive for congestion, postnasal drip and sore throat. Negative for ear pain and sinus pain.   Eyes: Negative.   Respiratory:  Positive for cough. Negative for shortness of breath and wheezing.        Objective:   Physical Exam Constitutional:      Appearance: Normal appearance. He is not ill-appearing.  Cardiovascular:     Rate and Rhythm: Normal rate and regular rhythm.     Pulses: Normal pulses.     Heart sounds: Normal heart sounds.  Pulmonary:     Effort: Pulmonary effort is normal.     Breath sounds: Rhonchi present. No wheezing or rales.  Neurological:     Mental Status: He is alert.           Assessment & Plan:  Bronchitis, treat with a Zpack. Use inhaler as needed.  Garnette Olmsted, MD

## 2024-01-05 ENCOUNTER — Encounter: Payer: Self-pay | Admitting: Family Medicine

## 2024-01-06 MED ORDER — AMOXICILLIN-POT CLAVULANATE 875-125 MG PO TABS: 1.0000 | ORAL_TABLET | Freq: Two times a day (BID) | ORAL | 0 refills | Status: DC

## 2024-01-06 NOTE — Telephone Encounter (Signed)
 Tell him to stop the Zpack. Instead I will send in a RX for Augmentin 

## 2024-01-10 ENCOUNTER — Encounter: Payer: Medicare Other | Admitting: Family Medicine

## 2024-01-11 ENCOUNTER — Encounter: Admitting: Family Medicine

## 2024-01-16 ENCOUNTER — Other Ambulatory Visit: Payer: Self-pay

## 2024-01-16 DIAGNOSIS — E785 Hyperlipidemia, unspecified: Secondary | ICD-10-CM

## 2024-01-16 MED ORDER — ROSUVASTATIN CALCIUM 10 MG PO TABS: 10.0000 mg | ORAL_TABLET | Freq: Every day | ORAL | 1 refills | Status: DC

## 2024-01-17 ENCOUNTER — Ambulatory Visit (INDEPENDENT_AMBULATORY_CARE_PROVIDER_SITE_OTHER): Admitting: *Deleted

## 2024-01-17 VITALS — Ht 74.0 in | Wt 200.0 lb

## 2024-01-17 DIAGNOSIS — Z0000 Encounter for general adult medical examination without abnormal findings: Secondary | ICD-10-CM | POA: Diagnosis not present

## 2024-01-17 NOTE — Progress Notes (Signed)
 Subjective:   Jerry Simpson is a 78 y.o. male who presents for Medicare Annual/Subsequent preventive examination.  Visit Complete: Virtual I connected with  Jerry Simpson on 01/17/24 by a audio enabled telemedicine application and verified that I am speaking with the correct person using two identifiers.  Patient Location: Home  Provider Location: Home Office  I discussed the limitations of evaluation and management by telemedicine. The patient expressed understanding and agreed to proceed.  Vital Signs: Because this visit was a virtual/telehealth visit, some criteria may be missing or patient reported. Any vitals not documented were not able to be obtained and vitals that have been documented are patient reported.   Cardiac Risk Factors include: advanced age (>47men, >64 women);male gender;obesity (BMI >30kg/m2)     Objective:    Today's Vitals   01/17/24 1331  Weight: 200 lb (90.7 kg)  Height: 6' 2 (1.88 m)   Body mass index is 25.68 kg/m.     01/17/2024    1:25 PM 12/15/2022   10:38 AM 11/25/2021   10:29 AM 07/28/2020    9:49 AM 12/29/2017   10:46 AM 10/11/2017    8:17 AM 10/04/2017    1:36 PM  Advanced Directives  Does Patient Have a Medical Advance Directive? Yes Yes Yes Yes Yes  Yes  Yes   Type of Sales promotion account executive of [employer] Power of [place];Living will Healthcare Power of [employer];Living will Healthcare Power of [place];Living will    Copy of Healthcare Power of Attorney in Chart? No - copy requested Yes - validated most recent copy scanned in chart (See row information) No - copy requested Yes - validated most recent copy scanned in chart (See row information) No - copy requested        Data saved with a previous flowsheet row definition    Current Medications (verified) Outpatient Encounter Medications as of 01/17/2024  Medication Sig   albuterol  (VENTOLIN  HFA) 108 (90 Base) MCG/ACT inhaler Inhale 2  puffs into the lungs every 6 (six) hours as needed for wheezing or shortness of breath.   aspirin EC 81 MG tablet Take 1 tablet by mouth daily.   BIOTIN PO Take 1 tablet by mouth daily.   Coenzyme Q10 (CO Q-10) 100 MG CAPS Take 1 capsule by mouth daily.   cyanocobalamin 2000 MCG tablet Take 2,000 mcg by mouth daily.   fenofibrate  160 MG tablet Take 1 tablet (160 mg total) by mouth daily.   fexofenadine (ALLEGRA) 180 MG tablet Take 180 mg by mouth daily as needed for allergies.    finasteride (PROPECIA) 1 MG tablet Take 0.5 tablets by mouth daily.   fluticasone  (FLONASE ) 50 MCG/ACT nasal spray Place 2 sprays into both nostrils daily.   fluticasone -salmeterol (WIXELA INHUB) 250-50 MCG/ACT AEPB Inhale 1 puff into the lungs in the morning and at bedtime.   folic acid (FOLVITE) 1 MG tablet Take 1 mg by mouth daily.   guaiFENesin -codeine  (ROBITUSSIN AC) 100-10 MG/5ML syrup Take 10 mLs by mouth 3 (three) times daily as needed for cough.   rosuvastatin  (CRESTOR ) 10 MG tablet Take 1 tablet (10 mg total) by mouth daily.   amoxicillin -clavulanate (AUGMENTIN ) 875-125 MG tablet Take 1 tablet by mouth 2 (two) times daily.   No facility-administered encounter medications on file as of 01/17/2024.    Allergies (verified) Sulfonamide derivatives, Claritin [loratadine], Minoxidil, and Sulfa antibiotics   History: Past Medical History:  Diagnosis Date   Adenomatous polyps  Arthritis    generalized- spine   Borderline systolic HTN    BPH (benign prostatic hyperplasia)    elevated PSA, Dr Watt   CAD (coronary artery disease)    Cataract    bilateral sx   Environmental allergies    GERD (gastroesophageal reflux disease)    NOT on meds/hx of   Hyperlipidemia    on meds   Seasonal allergies    PRN meds   Past Surgical History:  Procedure Laterality Date   CATARACT EXTRACTION, BILATERAL  2012   COLONOSCOPY  2019   JP-MAC-suprep(exc)-tics/TA x 2 frag-5 yr recall   CORONARY ANGIOPLASTY WITH  STENT PLACEMENT  2004   Dr. Micky   POLYPECTOMY  2019   TA x 2 frags   prostatic artery embolization     TONSILLECTOMY  1962   Family History  Problem Relation Age of Onset   Hypertension Father    Heart attack Father 21   Diabetes Father    Lung cancer Brother        leukemia terminally   Heart attack Brother 72   Heart disease Paternal Uncle 13   Stroke Maternal Grandmother        >65   Colon cancer Neg Hx    Stomach cancer Neg Hx    Colon polyps Neg Hx    Esophageal cancer Neg Hx    Rectal cancer Neg Hx    Social History   Socioeconomic History   Marital status: Married    Spouse name: Not on file   Number of children: Not on file   Years of education: Not on file   Highest education level: Not on file  Occupational History   Not on file  Tobacco Use   Smoking status: Never   Smokeless tobacco: Never  Vaping Use   Vaping status: Never Used  Substance and Sexual Activity   Alcohol use: Yes    Alcohol/week: 3.0 standard drinks of alcohol    Types: 3 drink(s) per week    Comment:  < 7/ week   Drug use: No   Sexual activity: Not Currently  Other Topics Concern   Not on file  Social History Narrative   Lives with wife.     Social Drivers of Corporate investment banker Strain: Low Risk  (01/17/2024)   Overall Financial Resource Strain (CARDIA)    Difficulty of Paying Living Expenses: Not hard at all  Food Insecurity: No Food Insecurity (01/17/2024)   Hunger Vital Sign    Worried About Running Out of Food in the Last Year: Never true    Ran Out of Food in the Last Year: Never true  Transportation Needs: No Transportation Needs (01/17/2024)   PRAPARE - Administrator, Civil Service (Medical): No    Lack of Transportation (Non-Medical): No  Physical Activity: Inactive (01/17/2024)   Exercise Vital Sign    Days of Exercise per Week: 0 days    Minutes of Exercise per Session: 0 min  Stress: No Stress Concern Present (01/17/2024)   [employer] of  Occupational Health - Occupational Stress Questionnaire    Feeling of Stress: Not at all  Social Connections: Moderately Integrated (01/17/2024)   Social Connection and Isolation Panel    Frequency of Communication with Friends and Family: More than three times a week    Frequency of Social Gatherings with Friends and Family: Three times a week    Attends Religious Services: Never    Active Member of  Clubs or Organizations: No    Attends Engineer, structural: More than 4 times per year    Marital Status: Married    Tobacco Counseling Counseling given: Not Answered   Clinical Intake:  Pre-visit preparation completed: Yes  Pain : No/denies pain     Diabetes: No  How often do you need to have someone help you when you read instructions, pamphlets, or other written materials from your doctor or pharmacy?: 1 - Never  Interpreter Needed?: No  Information entered by :: Mliss Graff LPN   Activities of Daily Living    01/17/2024    2:00 PM  In your present state of health, do you have any difficulty performing the following activities:  Hearing? 0  Vision? 0  Difficulty concentrating or making decisions? 0  Walking or climbing stairs? 0  Dressing or bathing? 0  Doing errands, shopping? 0  Preparing Food and eating ? N  Using the Toilet? N  In the past six months, have you accidently leaked urine? N  Do you have problems with loss of bowel control? N  Managing your Medications? N  Managing your Finances? N  Housekeeping or managing your Housekeeping? N    Patient Care Team: Mahlon Comer BRAVO, MD as PCP - General (Family Medicine) Abran Norleen SAILOR, MD as Consulting Physician (Gastroenterology) Honora Anselm RAMAN, MD as Referring Physician (General Surgery) [facility], Rande, MD as Attending Physician Shona Norleen, MD (Dermatology) Pandora Cadet, [facility] as Pharmacist (Pharmacist)  Indicate any recent Medical Services you may have received from other than [facility]  providers in the past year (date may be approximate).     Assessment:   This is a routine wellness examination for [facility].  Hearing/Vision screen Hearing Screening - Comments:: Up to date Vision Screening - Comments:: Up to date [facility]   Goals Addressed             This Visit's Progress    Increase physical activity   Not on track    Increase activity     Increase physical activity   Not on track    Patient Stated   On track    Eat healthier     Patient Stated       Stay  alive       Depression Screen    01/17/2024    1:28 PM 07/08/2023   10:31 AM 03/17/2023    1:14 PM 02/07/2023   11:25 AM 01/05/2023    9:48 AM 12/15/2022   10:43 AM 10/05/2022   10:50 AM  PHQ 2/9 Scores  PHQ - 2 Score 0 0 0 0 0 0 0  PHQ- 9 Score 0 0 0 0 0 0 0    Fall Risk    01/17/2024    1:31 PM 01/17/2024    1:24 PM 07/08/2023   10:31 AM 03/17/2023    1:14 PM 02/07/2023   11:25 AM  Fall Risk   Falls in the past year? 0 0 0 0 0  Number falls in past yr: 0 0 0 0 0  Injury with Fall? 0 0 0 0 0  Risk for fall due to :   No Fall Risks No Fall Risks No Fall Risks  Follow up Falls evaluation completed;Education provided;Falls prevention discussed Falls evaluation completed;Education provided;Falls prevention discussed Falls evaluation completed Falls evaluation completed Falls evaluation completed    MEDICARE RISK AT HOME: Medicare Risk at Home Any stairs in or around the home?: Yes If so, are there  any without handrails?: No Home free of loose throw rugs in walkways, pet beds, electrical cords, etc?: Yes Adequate lighting in your home to reduce risk of falls?: Yes Life alert?: No Use of a cane, walker or w/c?: No Grab bars in the bathroom?: Yes Shower chair or bench in shower?: Yes Elevated toilet seat or a handicapped toilet?: No  TIMED UP AND GO:  Was the test performed?  No    Cognitive Function:        01/17/2024    2:01 PM 12/15/2022   10:39 AM  6CIT Screen  What Year? 0 points  0 points  What month? 0 points 0 points  What time? 0 points 0 points  Count back from 20 0 points 0 points  Months in reverse 0 points 0 points  Repeat phrase 0 points 0 points  Total Score 0 points 0 points    Immunizations Immunization History  Administered Date(s) Administered   Fluad Quad(high Dose 65+) 05/06/2020, 04/06/2021   Influenza Split 03/30/2011, 04/10/2012   Influenza Whole 04/25/2007, 04/03/2009, 06/05/2010   Influenza, High Dose Seasonal PF 05/08/2013, 04/02/2015, 04/08/2016, 03/09/2019   Influenza,inj,Quad PF,6+ Mos 04/18/2014, 03/14/2017   Influenza-Unspecified 04/21/2022   Moderna Sars-Covid-2 Vaccination 08/02/2019, 08/31/2019   Pneumococcal Conjugate-13 07/04/2013   Pneumococcal Polysaccharide-23 07/09/2014   Tdap 06/30/2012   Zoster, Live 03/30/2011    TDAP status: Due, Education has been provided regarding the importance of this vaccine. Advised may receive this vaccine at local pharmacy or Health Dept. Aware to provide a copy of the vaccination record if obtained from local pharmacy or Health Dept. Verbalized acceptance and understanding.  Flu Vaccine status: Up to date  Pneumococcal vaccine status: Up to date  Covid-19 vaccine status: Information provided on how to obtain vaccines.   Qualifies for Shingles Vaccine? Yes   Zostavax completed No   Shingrix Completed?: No.    Education has been provided regarding the importance of this vaccine. Patient has been advised to call insurance company to determine out of pocket expense if they have not yet received this vaccine. Advised may also receive vaccine at local pharmacy or Health Dept. Verbalized acceptance and understanding.  Screening Tests Health Maintenance  Topic Date Due   Hepatitis C Screening  Never done   Zoster Vaccines- Shingrix (1 of 2) 12/06/1997   DTaP/Tdap/Td (2 - Td or Tdap) 06/30/2022   COVID-19 Vaccine (3 - 2024-25 season) 02/20/2023   INFLUENZA VACCINE  01/20/2024   Medicare  Annual Wellness (AWV)  01/16/2025   Colonoscopy  01/19/2028   Pneumococcal Vaccine: 50+ Years  Completed   Hepatitis B Vaccines  Aged Out   HPV VACCINES  Aged Out   Meningococcal B Vaccine  Aged Out    Health Maintenance  Health Maintenance Due  Topic Date Due   Hepatitis C Screening  Never done   Zoster Vaccines- Shingrix (1 of 2) 12/06/1997   DTaP/Tdap/Td (2 - Td or Tdap) 06/30/2022   COVID-19 Vaccine (3 - 2024-25 season) 02/20/2023    Colorectal cancer screening: Type of screening: Cologuard. Completed 2024. Repeat every   years  Lung Cancer Screening: (Low Dose CT Chest recommended if Age 74-80 years, 20 pack-year currently smoking OR have quit w/in 15years.) does not qualify.   Lung Cancer Screening Referral:   Additional Screening:  Hepatitis C Screening:   never done  Vision Screening: Recommended annual ophthalmology exams for early detection of glaucoma and other disorders of the eye. Is the patient up to date with their  annual eye exam?  Yes  Who is the provider or what is the name of the office in which the patient attends annual eye exams? [facility] If pt is not established with a provider, would they like to be referred to a provider to establish care? No .   Dental Screening: Recommended annual dental exams for proper oral hygiene    Community Resource Referral / Chronic Care Management: CRR required this visit?  No   CCM required this visit?  No     Plan:     I have personally reviewed and noted the following in the patient's chart:   Medical and social history Use of alcohol, tobacco or illicit drugs  Current medications and supplements including opioid prescriptions. Patient is not currently taking opioid prescriptions. Functional ability and status Nutritional status Physical activity Advanced directives List of other physicians Hospitalizations, surgeries, and ER visits in previous 12 months Vitals Screenings to include cognitive, depression,  and falls Referrals and appointments  In addition, I have reviewed and discussed with patient certain preventive protocols, quality metrics, and best practice recommendations. A written personalized care plan for preventive services as well as general preventive health recommendations were provided to patient.     Mliss Graff, LPN   [id number]/[id number]   After Visit Summary: (MyChart) Due to this being a telephonic visit, the after visit summary with patients personalized plan was offered to patient via MyChart   Nurse Notes:

## 2024-01-17 NOTE — Patient Instructions (Signed)
 [name] , Thank you for taking time to come for your Medicare Wellness Visit. I appreciate your ongoing commitment to your health goals. Please review the following plan we discussed and let me know if I can assist you in the future.   Screening recommendations/referrals: Colonoscopy: up to date Recommended yearly ophthalmology/optometry visit for glaucoma screening and checkup Recommended yearly dental visit for hygiene and checkup  Vaccinations: Influenza vaccine: up to date Pneumococcal vaccine: up to date Tdap vaccine: Education provided Shingles vaccine: Education provided        Preventive Care 65 Years and Older, Male Preventive care refers to lifestyle choices and visits with your health care provider that can promote health and wellness. What does preventive care include? A yearly physical exam. This is also called an annual well check. Dental exams once or twice a year. Routine eye exams. Ask your health care provider how often you should have your eyes checked. Personal lifestyle choices, including: Daily care of your teeth and gums. Regular physical activity. Eating a healthy diet. Avoiding tobacco and drug use. Limiting alcohol use. Practicing safe sex. Taking low doses of aspirin every day. Taking vitamin and mineral supplements as recommended by your health care provider. What happens during an annual well check? The services and screenings done by your health care provider during your annual well check will depend on your age, overall health, lifestyle risk factors, and family history of disease. Counseling  Your health care provider may ask you questions about your: Alcohol use. Tobacco use. Drug use. Emotional well-being. Home and relationship well-being. Sexual activity. Eating habits. History of falls. Memory and ability to understand (cognition). Work and work Astronomer. Screening  You may have the following tests or measurements: Height,  weight, and BMI. Blood pressure. Lipid and cholesterol levels. These may be checked every 5 years, or more frequently if you are over 75 years old. Skin check. Lung cancer screening. You may have this screening every year starting at age 58 if you have a 30-pack-year history of smoking and currently smoke or have quit within the past 15 years. Fecal occult blood test (FOBT) of the stool. You may have this test every year starting at age 55. Flexible sigmoidoscopy or colonoscopy. You may have a sigmoidoscopy every 5 years or a colonoscopy every 10 years starting at age 14. Prostate cancer screening. Recommendations will vary depending on your family history and other risks. Hepatitis C blood test. Hepatitis B blood test. Sexually transmitted disease (STD) testing. Diabetes screening. This is done by checking your blood sugar (glucose) after you have not eaten for a while (fasting). You may have this done every 1-3 years. Abdominal aortic aneurysm (AAA) screening. You may need this if you are a current or former smoker. Osteoporosis. You may be screened starting at age 77 if you are at high risk. Talk with your health care provider about your test results, treatment options, and if necessary, the need for more tests. Vaccines  Your health care provider may recommend certain vaccines, such as: Influenza vaccine. This is recommended every year. Tetanus, diphtheria, and acellular pertussis (Tdap, Td) vaccine. You may need a Td booster every 10 years. Zoster vaccine. You may need this after age 85. Pneumococcal 13-valent conjugate (PCV13) vaccine. One dose is recommended after age 1. Pneumococcal polysaccharide (PPSV23) vaccine. One dose is recommended after age 20. Talk to your health care provider about which screenings and vaccines you need and how often you need them. This information is not intended  to replace advice given to you by your health care provider. Make sure you discuss any  questions you have with your health care provider. Document Released: 07/04/2015 Document Revised: 02/25/2016 Document Reviewed: 04/08/2015 Elsevier Interactive Patient Education  2017 [employer].  Fall Prevention in the Home Falls can cause injuries. They can happen to people of all ages. There are many things you can do to make your home safe and to help prevent falls. What can I do on the outside of my home? Regularly fix the edges of walkways and driveways and fix any cracks. Remove anything that might make you trip as you walk through a door, such as a raised step or threshold. Trim any bushes or trees on the path to your home. Use bright outdoor lighting. Clear any walking paths of anything that might make someone trip, such as rocks or tools. Regularly check to see if handrails are loose or broken. Make sure that both sides of any steps have handrails. Any raised decks and porches should have guardrails on the edges. Have any leaves, snow, or ice cleared regularly. Use sand or salt on walking paths during winter. Clean up any spills in your garage right away. This includes oil or grease spills. What can I do in the bathroom? Use night lights. Install grab bars by the toilet and in the tub and shower. Do not use towel bars as grab bars. Use non-skid mats or decals in the tub or shower. If you need to sit down in the shower, use a plastic, non-slip stool. Keep the floor dry. Clean up any water that spills on the floor as soon as it happens. Remove soap buildup in the tub or shower regularly. Attach bath mats securely with double-sided non-slip rug tape. Do not have throw rugs and other things on the floor that can make you trip. What can I do in the bedroom? Use night lights. Make sure that you have a light by your bed that is easy to reach. Do not use any sheets or blankets that are too big for your bed. They should not hang down onto the floor. Have a firm chair that has side  arms. You can use this for support while you get dressed. Do not have throw rugs and other things on the floor that can make you trip. What can I do in the kitchen? Clean up any spills right away. Avoid walking on wet floors. Keep items that you use a lot in easy-to-reach places. If you need to reach something above you, use a strong step stool that has a grab bar. Keep electrical cords out of the way. Do not use floor polish or wax that makes floors slippery. If you must use wax, use non-skid floor wax. Do not have throw rugs and other things on the floor that can make you trip. What can I do with my stairs? Do not leave any items on the stairs. Make sure that there are handrails on both sides of the stairs and use them. Fix handrails that are broken or loose. Make sure that handrails are as long as the stairways. Check any carpeting to make sure that it is firmly attached to the stairs. Fix any carpet that is loose or worn. Avoid having throw rugs at the top or bottom of the stairs. If you do have throw rugs, attach them to the floor with carpet tape. Make sure that you have a light switch at the top of the stairs and  the bottom of the stairs. If you do not have them, ask someone to add them for you. What else can I do to help prevent falls? Wear shoes that: Do not have high heels. Have rubber bottoms. Are comfortable and fit you well. Are closed at the toe. Do not wear sandals. If you use a stepladder: Make sure that it is fully opened. Do not climb a closed stepladder. Make sure that both sides of the stepladder are locked into place. Ask someone to hold it for you, if possible. Clearly mark and make sure that you can see: Any grab bars or handrails. First and last steps. Where the edge of each step is. Use tools that help you move around (mobility aids) if they are needed. These include: Canes. Walkers. Scooters. Crutches. Turn on the lights when you go into a dark area.  Replace any light bulbs as soon as they burn out. Set up your furniture so you have a clear path. Avoid moving your furniture around. If any of your floors are uneven, fix them. If there are any pets around you, be aware of where they are. Review your medicines with your doctor. Some medicines can make you feel dizzy. This can increase your chance of falling. Ask your doctor what other things that you can do to help prevent falls. This information is not intended to replace advice given to you by your health care provider. Make sure you discuss any questions you have with your health care provider. Document Released: 04/03/2009 Document Revised: 11/13/2015 Document Reviewed: 07/12/2014 Elsevier Interactive Patient Education  2017 [employer].

## 2024-01-27 DIAGNOSIS — I2510 Atherosclerotic heart disease of native coronary artery without angina pectoris: Secondary | ICD-10-CM | POA: Diagnosis not present

## 2024-02-14 DIAGNOSIS — Z08 Encounter for follow-up examination after completed treatment for malignant neoplasm: Secondary | ICD-10-CM | POA: Diagnosis not present

## 2024-02-14 DIAGNOSIS — L820 Inflamed seborrheic keratosis: Secondary | ICD-10-CM | POA: Diagnosis not present

## 2024-02-14 DIAGNOSIS — X32XXXD Exposure to sunlight, subsequent encounter: Secondary | ICD-10-CM | POA: Diagnosis not present

## 2024-02-14 DIAGNOSIS — Z85828 Personal history of other malignant neoplasm of skin: Secondary | ICD-10-CM | POA: Diagnosis not present

## 2024-02-14 DIAGNOSIS — L570 Actinic keratosis: Secondary | ICD-10-CM | POA: Diagnosis not present

## 2024-02-22 ENCOUNTER — Encounter: Payer: Self-pay | Admitting: Family Medicine

## 2024-02-22 ENCOUNTER — Ambulatory Visit (INDEPENDENT_AMBULATORY_CARE_PROVIDER_SITE_OTHER): Admitting: Family Medicine

## 2024-02-22 ENCOUNTER — Other Ambulatory Visit

## 2024-02-22 VITALS — BP 128/64 | HR 85 | Temp 98.0°F | Resp 18 | Ht 74.0 in | Wt 205.2 lb

## 2024-02-22 DIAGNOSIS — Z23 Encounter for immunization: Secondary | ICD-10-CM

## 2024-02-22 DIAGNOSIS — Z125 Encounter for screening for malignant neoplasm of prostate: Secondary | ICD-10-CM | POA: Diagnosis not present

## 2024-02-22 DIAGNOSIS — E785 Hyperlipidemia, unspecified: Secondary | ICD-10-CM | POA: Diagnosis not present

## 2024-02-22 DIAGNOSIS — Z0000 Encounter for general adult medical examination without abnormal findings: Secondary | ICD-10-CM

## 2024-02-22 NOTE — Progress Notes (Unsigned)
   Subjective:    Patient ID: Jerry Simpson, male    DOB: [birth year], 78 y.o.   MRN: [id number]  HPI CPE- UTD on colonoscopy, PNA  Patient Care Team    Relationship Specialty Notifications Start End  Mahlon Comer BRAVO, MD PCP - General Family Medicine  04/18/14    Comment: Lavell Kiang, Norleen SAILOR, MD Consulting Physician Gastroenterology  07/09/14   Honora Anselm RAMAN, MD Referring Physician General Surgery  08/18/15    Comment: Urology  [facility] [place], MD Attending Physician   12/29/17    Comment: Cam Hurst, Norleen, MD  Dermatology  12/29/17   Pandora Cadet, [facility] Pharmacist Pharmacist  10/12/19    Comment: PHONE NUMBER [phone]     Health Maintenance  Topic Date Due   Hepatitis C Screening  Never done   Zoster Vaccines- Shingrix (1 of 2) 12/06/1997   DTaP/Tdap/Td (2 - Td or Tdap) 06/30/2022   INFLUENZA VACCINE  01/20/2024   COVID-19 Vaccine (3 - 2025-26 season) 02/20/2024   Medicare Annual Wellness (AWV)  01/16/2025   Colonoscopy  01/19/2028   Pneumococcal Vaccine: 50+ Years  Completed   HPV VACCINES  Aged Out   Meningococcal B Vaccine  Aged Out      Review of Systems Patient reports no vision/hearing changes, anorexia, fever ,adenopathy, persistant/recurrent hoarseness, swallowing issues, chest pain, palpitations, edema, persistant/recurrent cough, hemoptysis, dyspnea (rest,exertional, paroxysmal nocturnal), gastrointestinal  bleeding (melena, rectal bleeding), abdominal pain, excessive heart burn, GU symptoms (dysuria, hematuria, voiding/incontinence issues) syncope, focal weakness, memory loss, numbness & tingling, skin/hair/nail changes, depression, anxiety, abnormal bruising/bleeding, musculoskeletal symptoms/signs.     Objective:   Physical Exam General Appearance:    Alert, cooperative, no distress, appears stated age  Head:    Normocephalic, without obvious abnormality, atraumatic  Eyes:    PERRL, conjunctiva/corneas clear, EOM's intact both eyes        Ears:    Normal TM's and external ear canals, both ears  Nose:   Nares normal, septum midline, mucosa normal, no drainage   or sinus tenderness  Throat:   Lips, mucosa, and tongue normal; teeth and gums normal  Neck:   Supple, symmetrical, trachea midline, no adenopathy;       thyroid :  No enlargement/tenderness/nodules  Back:     Symmetric, no curvature, ROM normal, no CVA tenderness  Lungs:     Clear to auscultation bilaterally, respirations unlabored  Chest wall:    No tenderness or deformity  Heart:    Regular rate and rhythm, S1 and S2 normal, no murmur, rub   or gallop  Abdomen:     Soft, non-tender, bowel sounds active all four quadrants,    no masses, no organomegaly  Genitalia:    deferred  Rectal:    Extremities:   Extremities normal, atraumatic, no cyanosis or edema  Pulses:   2+ and symmetric all extremities  Skin:   Skin color, texture, turgor normal, no rashes or lesions  Lymph nodes:   Cervical, supraclavicular, and axillary nodes normal  Neurologic:   CNII-XII intact. Normal strength, sensation and reflexes      throughout          Assessment & Plan:

## 2024-02-22 NOTE — Patient Instructions (Signed)
 Follow up in 6 months to recheck cholesterol We'll notify you of your lab results and make any changes if needed Keep up the good work!  You look great! Call with any questions or concerns Stay Safe!  Stay Healthy! Happy Fall!!!

## 2024-02-23 ENCOUNTER — Ambulatory Visit: Payer: Self-pay | Admitting: Family Medicine

## 2024-02-23 DIAGNOSIS — R739 Hyperglycemia, unspecified: Secondary | ICD-10-CM

## 2024-02-23 LAB — BASIC METABOLIC PANEL WITH GFR
BUN: 21 mg/dL (ref 6–23)
CO2: 29 meq/L (ref 19–32)
Calcium: 9.8 mg/dL (ref 8.4–10.5)
Chloride: 101 meq/L (ref 96–112)
Creatinine, Ser: 1.14 mg/dL (ref 0.40–1.50)
GFR: 62.6 mL/min (ref >60.00)
Glucose, Bld: 134 mg/dL — ABNORMAL HIGH (ref 70–99)
Potassium: 4.3 meq/L (ref 3.5–5.1)
Sodium: 140 meq/L (ref 135–145)

## 2024-02-23 LAB — CBC WITH DIFFERENTIAL/PLATELET
Basophils Absolute: 0.1 K/uL (ref 0.0–0.1)
Basophils Relative: 1.5 % (ref 0.0–3.0)
Eosinophils Absolute: 0.2 K/uL (ref 0.0–0.7)
Eosinophils Relative: 3 % (ref 0.0–5.0)
HCT: 43.4 % (ref 39.0–52.0)
Hemoglobin: 14.7 g/dL (ref 13.0–17.0)
Lymphocytes Relative: 31.6 % (ref 12.0–46.0)
Lymphs Abs: 1.8 K/uL (ref 0.7–4.0)
MCHC: 33.8 g/dL (ref 30.0–36.0)
MCV: 87.3 fl (ref 78.0–100.0)
Monocytes Absolute: 0.5 K/uL (ref 0.1–1.0)
Monocytes Relative: 9 % (ref 3.0–12.0)
Neutro Abs: 3.2 K/uL (ref 1.4–7.7)
Neutrophils Relative %: 54.9 % (ref 43.0–77.0)
Platelets: 292 K/uL (ref 150.0–400.0)
RBC: 4.96 Mil/uL (ref 4.22–5.81)
RDW: 13.4 % (ref 11.5–15.5)
WBC: 5.8 K/uL (ref 4.0–10.5)

## 2024-02-23 LAB — LIPID PANEL
Cholesterol: 148 mg/dL (ref 0–200)
HDL: 32.9 mg/dL — ABNORMAL LOW (ref >39.00)
LDL Cholesterol: 54 mg/dL (ref 0–99)
NonHDL: 115.01
Total CHOL/HDL Ratio: 4
Triglycerides: 304 mg/dL — ABNORMAL HIGH (ref 0.0–149.0)
VLDL: 60.8 mg/dL — ABNORMAL HIGH (ref 0.0–40.0)

## 2024-02-23 LAB — HEPATIC FUNCTION PANEL
ALT: 21 U/L (ref 0–53)
AST: 25 U/L (ref 0–37)
Albumin: 4.7 g/dL (ref 3.5–5.2)
Alkaline Phosphatase: 54 U/L (ref 39–117)
Bilirubin, Direct: 0.1 mg/dL (ref 0.0–0.3)
Total Bilirubin: 0.6 mg/dL (ref 0.2–1.2)
Total Protein: 7.4 g/dL (ref 6.0–8.3)

## 2024-02-23 LAB — TSH: TSH: 1.4 u[IU]/mL (ref 0.35–5.50)

## 2024-02-23 LAB — PSA, MEDICARE: PSA: 1.33 ng/mL (ref 0.10–4.00)

## 2024-02-23 NOTE — Telephone Encounter (Signed)
 Ordered add on future A1c test and faxed add on lab.  Called patient to discuss lab results.   Left vm to return call.

## 2024-02-23 NOTE — Assessment & Plan Note (Signed)
 Pt's PE WNL and unchanged from previous.  UTD on colonoscopy and PNA vaccine.  Flu shot given today.  Check labs.  Anticipatory guidance provided.

## 2024-02-23 NOTE — Telephone Encounter (Signed)
-----   Message from Comer Greet sent at 02/23/2024  3:45 PM EDT ----- Labs look good w/ exception of high triglycerides.  Both of you had high triglycerides so I'm guessing you ate lunch prior to your appt.  Your sugar is also elevated so we will add an A1C to assess  for possible diabetes (A1C, dx hyperglycemia)  No med changes at this time but we'll see what the A1C says.  Continue to work on [employer] and regular exercise. ----- Message ----- From: Interface, Lab In Three Zero One Sent: 02/23/2024   1:57 PM EDT To: Comer FORBES Greet, MD

## 2024-02-24 ENCOUNTER — Ambulatory Visit: Payer: Self-pay | Admitting: Family Medicine

## 2024-02-24 ENCOUNTER — Ambulatory Visit (INDEPENDENT_AMBULATORY_CARE_PROVIDER_SITE_OTHER)

## 2024-02-24 DIAGNOSIS — R739 Hyperglycemia, unspecified: Secondary | ICD-10-CM | POA: Diagnosis not present

## 2024-02-24 LAB — HEMOGLOBIN A1C: Hgb A1c MFr Bld: 5.9 % (ref 4.6–6.5)

## 2024-03-07 DIAGNOSIS — K080 Exfoliation of teeth due to systemic causes: Secondary | ICD-10-CM | POA: Diagnosis not present

## 2024-03-15 ENCOUNTER — Encounter: Payer: Self-pay | Admitting: Family Medicine

## 2024-03-15 ENCOUNTER — Telehealth: Payer: Self-pay | Admitting: Family Medicine

## 2024-03-15 ENCOUNTER — Ambulatory Visit: Payer: Self-pay

## 2024-03-15 NOTE — Telephone Encounter (Signed)
 FYI Only or Action Required?: Action required by provider: Pt requesting prednisone  for onset of cough.  Patient was last seen in primary care on 02/22/2024 by Mahlon Comer BRAVO, MD.  Called Nurse Triage reporting Cough (/).  Symptoms began several days ago.  Interventions attempted: OTC medications: Zyzal, flonaze, dextromethorphan .  Symptoms are: unchanged.  Triage Disposition: See Physician Within 24 Hours  Patient/caregiver understands and will follow disposition?: No, wishes to speak with PCP. Declines scheduling an appointment, asking to have rx for prednisone  sent into [place] pharmacy in [place].  Reason for Disposition  [1] Continuous (nonstop) coughing interferes with work or school AND [2] no improvement using cough treatment per Care Advice  Answer Assessment - Initial Assessment Questions Pt reports onset of mildly productive cough on Monday. Traveled to beach [facility] in [place] on Monday. Pt thinks it's tied to his allergies as this happens every fall and spring for the past few years. Has seen provider for this in the past, normally treats with prednisone . Requesting rx be called in by PCP.  1. ONSET: When did the cough begin?      Monday 2. SEVERITY: How bad is the cough today?      7/10 3. SPUTUM: Describe the color of your sputum (e.g., none, dry cough; clear, white, yellow, green)     Scant clear yellow 4. HEMOPTYSIS: Are you coughing up any blood? If Yes, ask: How much? (e.g., flecks, streaks, tablespoons, etc.)     No 5. DIFFICULTY BREATHING: Are you having difficulty breathing? If Yes, ask: How bad is it? (e.g., mild, moderate, severe)      No, some mild SOB when climbing stairs, slightly increased from baseline. 6. FEVER: Do you have a fever? If Yes, ask: What is your temperature, how was it measured, and when did it start?     No 7. CARDIAC HISTORY: Do you have any history of heart disease? (e.g., heart attack, congestive heart failure)       Stent x1 30-40 years ago, no problems since. 8. LUNG HISTORY: Do you have any history of lung disease?  (e.g., pulmonary embolus, asthma, emphysema)     No 9. PE RISK FACTORS: Do you have a history of blood clots? (or: recent major surgery, recent prolonged travel, bedridden)     4 hour drive to the beach on Monday 10. OTHER SYMPTOMS: Do you have any other symptoms? (e.g., runny nose, wheezing, chest pain)       Mild wheezing and runny nose 12. TRAVEL: Have you traveled out of the country in the last month? (e.g., travel history, exposures)       No  Protocols used: Cough - Acute Productive-A-AH

## 2024-03-15 NOTE — Telephone Encounter (Unsigned)
 Copied from CRM #[id number]. Topic: Clinical - Medication Refill >> Mar 15, 2024  3:56 PM Suzen RAMAN wrote: Medication: predniSONE  (DELTASONE ) 20 MG tablet   Has the patient contacted their pharmacy? Yes   This is the patient's preferred pharmacy:   [facility] [id number] - [place], [place] - [address] [place] [address] Phone: [phone] Fax: [phone]  Is this the correct pharmacy for this prescription? Yes If no, delete pharmacy and type the correct one.   Has the prescription been filled recently? No  Is the patient out of the medication? Yes  Has the patient been seen for an appointment in the last year OR does the patient have an upcoming appointment? Yes  Can we respond through MyChart? Yes  Agent: Please be advised that Rx refills may take up to 3 business days. We ask that you follow-up with your pharmacy.

## 2024-03-15 NOTE — Telephone Encounter (Signed)
 Pt out of town- triaged by another RN   Triage Disposition: See Physician Within 24 Hours   Patient/caregiver understands and will follow disposition?: No, wishes to speak with PCP. Declines scheduling an appointment, asking to have rx for prednisone  sent into [facility] pharmacy in [place].

## 2024-03-16 NOTE — Telephone Encounter (Signed)
 Responded in [place]. Pt needs to make an appt.

## 2024-03-16 NOTE — Telephone Encounter (Signed)
 Pt needs appt. Sent a response on MyChart

## 2024-03-26 ENCOUNTER — Other Ambulatory Visit: Payer: Self-pay | Admitting: Family Medicine

## 2024-03-26 DIAGNOSIS — E785 Hyperlipidemia, unspecified: Secondary | ICD-10-CM

## 2024-03-26 MED ORDER — FENOFIBRATE 160 MG PO TABS: 160.0000 mg | ORAL_TABLET | Freq: Every day | ORAL | 1 refills | Status: AC

## 2024-03-26 NOTE — Telephone Encounter (Signed)
 Copied from CRM #[id number]. Topic: Clinical - Medication Refill >> Mar 26, 2024  1:34 PM Suzen RAMAN wrote: Medication: fenofibrate  160 MG tablet   Has the patient contacted their pharmacy? Yes   This is the patient's preferred pharmacy:  [facility] [id number] [place], [place] - [address] [place] [address] Phone: [phone] Fax: [phone]   Is this the correct pharmacy for this prescription? Yes If no, delete pharmacy and type the correct one.   Has the prescription been filled recently? No  Is the patient out of the medication? Yes  Has the patient been seen for an appointment in the last year OR does the patient have an upcoming appointment? Yes  Can we respond through MyChart? Yes  Agent: Please be advised that Rx refills may take up to 3 business days. We ask that you follow-up with your pharmacy.

## 2024-04-26 ENCOUNTER — Encounter: Payer: Self-pay | Admitting: Family Medicine

## 2024-04-26 ENCOUNTER — Ambulatory Visit (INDEPENDENT_AMBULATORY_CARE_PROVIDER_SITE_OTHER): Admitting: Family Medicine

## 2024-04-26 VITALS — BP 126/72 | HR 78 | Temp 98.0°F | Wt 205.0 lb

## 2024-04-26 DIAGNOSIS — J019 Acute sinusitis, unspecified: Secondary | ICD-10-CM

## 2024-04-26 DIAGNOSIS — R059 Cough, unspecified: Secondary | ICD-10-CM

## 2024-04-26 LAB — POCT INFLUENZA A/B
Influenza A, POC: NEGATIVE
Influenza B, POC: NEGATIVE

## 2024-04-26 LAB — POC COVID19 BINAXNOW: SARS Coronavirus 2 Ag: NEGATIVE

## 2024-04-26 MED ORDER — AMOXICILLIN-POT CLAVULANATE 875-125 MG PO TABS: 1.0000 | ORAL_TABLET | Freq: Two times a day (BID) | ORAL | 0 refills | Status: DC

## 2024-04-26 NOTE — Progress Notes (Signed)
   Subjective:    Patient ID: Jerry Simpson, male    DOB: [birth year], 78 y.o.   MRN: [id number]  HPI Here for 4 days of sinus congestion, PND, ST, and a dry cough. No fever.    Review of Systems  Constitutional: Negative.   HENT:  Positive for congestion, postnasal drip, sinus pressure and sore throat. Negative for ear pain.   Eyes: Negative.   Respiratory:  Positive for cough. Negative for shortness of breath and wheezing.        Objective:   Physical Exam Constitutional:      Appearance: Normal appearance.  HENT:     Right Ear: Tympanic membrane, ear canal and external ear normal.     Left Ear: Tympanic membrane, ear canal and external ear normal.     Nose: Nose normal.     Mouth/Throat:     Pharynx: Oropharynx is clear.  Eyes:     Conjunctiva/sclera: Conjunctivae normal.  Pulmonary:     Effort: Pulmonary effort is normal.     Breath sounds: Normal breath sounds.  Lymphadenopathy:     Cervical: No cervical adenopathy.  Neurological:     Mental Status: He is alert.           Assessment & Plan:  Sinusitis, treat with 10 days of Augmentin . Garnette Olmsted, MD

## 2024-06-07 ENCOUNTER — Ambulatory Visit: Payer: Self-pay

## 2024-06-07 NOTE — Telephone Encounter (Signed)
 Agree w/ advice given- pt will need to have ear evaluated prior to sending medication.

## 2024-06-07 NOTE — Telephone Encounter (Signed)
 FYI Only or Action Required?: FYI only for provider: Referred to UC.  Patient was last seen in primary care on 04/26/2024 by Johnny Garnette LABOR, MD.  Called Nurse Triage reporting Otalgia.  Symptoms began yesterday.  Interventions attempted: OTC medications: Aleve, nasal spray.  Symptoms are: unchanged.  Triage Disposition: See Physician Within 24 Hours  Patient/caregiver understands and will follow disposition?: Yes     Copied from CRM #[id number]. Topic: Clinical - Red Word Triage >> Jun 07, 2024 12:15 PM Drema MATSU wrote: Red Word that prompted transfer to Nurse Triage: Patient has a ear ache on the right side and it is very tender and happens constantly. He stated that it started yesterday.  He only notices it if he move or touches it. Reason for Disposition  Earache  (Exceptions: Brief ear pain of lasting less than 60 minutes, or earache occurring during air travel.)  Answer Assessment - Initial Assessment Questions 1. LOCATION: Which ear is involved?     Right ear  2. ONSET: When did the ear pain start?      Yesterday  3. SEVERITY: How bad is the pain?  (Scale 1-10; mild, moderate or severe)     Sharp pain, mild- pain is intermittent   4. URI SYMPTOMS: Do you have a runny nose or cough?     Runny nose noted  5. FEVER: Do you have a fever? If Yes, ask: What is your temperature, how was it measured, and when did it start?     No   6. CAUSE: Have you been swimming recently?, How often do you use Q-TIPS?, Have you had any recent air travel or scuba diving?     Yes, patient uses Q-Tips, las time was 3 days ago   7. OTHER SYMPTOMS: Do you have any other symptoms? (e.g., decreased hearing, dizziness, headache, stiff neck, vomiting)     No    Patient called in to triage with complaints of right ear pain.  This has been ongoing since yesterday. The patient stated he has never had an ear ache before. For home care, the patient is taking OTC Aleve, nasal  spray.    Appointment offered for for further evaluation per Epic there are no appointments until Monday 12/22; patient declines an appt at the [place] office for 12/19, as it is to far- declined Mobile bus as well; He was referred to UC. He agrees with the plan of care, and will reach out if symptoms worsen or persist. He was hoping the provider would call in medication, this RN advise this will need to be in-person.  Protocols used: [place]

## 2024-06-08 NOTE — Telephone Encounter (Signed)
 Spoke w/ Pt- informed of recommendations. Pt states he is feeling better.

## 2024-07-13 ENCOUNTER — Other Ambulatory Visit: Payer: Self-pay

## 2024-07-13 ENCOUNTER — Telehealth: Payer: Self-pay

## 2024-07-13 DIAGNOSIS — E785 Hyperlipidemia, unspecified: Secondary | ICD-10-CM

## 2024-07-13 MED ORDER — ROSUVASTATIN CALCIUM 10 MG PO TABS: 10.0000 mg | ORAL_TABLET | Freq: Every day | ORAL | 1 refills | Status: AC

## 2024-07-13 NOTE — Telephone Encounter (Signed)
 Copied from CRM #[id number]. Topic: Clinical - Medication Refill >> Jul 13, 2024 10:47 AM Rea C wrote: Medication: rosuvastatin  (CRESTOR ) 10 MG tablet  Has the patient contacted their pharmacy? Yes (Agent: If no, request that the patient contact the pharmacy for the refill. If patient does not wish to contact the pharmacy document the reason why and proceed with request.) (Agent: If yes, when and what did the pharmacy advise?)  This is the patient's preferred pharmacy:  [facility] [id number] [place], [place] - [address] [place] [address] Phone: [phone] Fax: [phone]   Is this the correct pharmacy for this prescription? Yes If no, delete pharmacy and type the correct one.   Has the prescription been filled recently? Yes  Is the patient out of the medication? Yes  Has the patient been seen for an appointment in the last year OR does the patient have an upcoming appointment? Yes  Can we respond through MyChart? Yes  Agent: Please be advised that Rx refills may take up to 3 business days. We ask that you follow-up with your pharmacy.

## 2024-07-13 NOTE — Telephone Encounter (Signed)
 Complete

## 2024-07-25 ENCOUNTER — Encounter: Payer: Self-pay | Admitting: Family Medicine

## 2024-07-25 ENCOUNTER — Ambulatory Visit: Admitting: Family Medicine

## 2024-07-25 VITALS — BP 158/82 | HR 95 | Temp 98.3°F | Ht 74.0 in | Wt 202.6 lb

## 2024-07-25 DIAGNOSIS — R051 Acute cough: Secondary | ICD-10-CM

## 2024-07-25 LAB — POCT INFLUENZA A/B
Influenza A, POC: NEGATIVE
Influenza B, POC: NEGATIVE

## 2024-07-25 MED ORDER — GUAIFENESIN-CODEINE 100-10 MG/5ML PO SOLN: 5.0000 mL | Freq: Four times a day (QID) | ORAL | 0 refills | Status: AC | PRN

## 2024-07-25 MED ORDER — AZITHROMYCIN 250 MG PO TABS: ORAL_TABLET | ORAL | 0 refills | Status: AC

## 2024-07-25 NOTE — Patient Instructions (Addendum)
 Follow up as needed or as scheduled Thankfully your flu test was negative! START the Zpack as directed USE the cough syrup as needed Drink LOTS of fluids REST! Add OTC phenylephrine (decongestant) to help w/ drainage Call with any questions or concerns Hang in there!

## 2024-07-25 NOTE — Progress Notes (Unsigned)
" ° °  Subjective:    Patient ID: Jerry Simpson, male    DOB: 1946-04-05, 80 y.o.   MRN: [id number]  HPI URI- sxs started 5 days ago.  A lot of nasal congestion, productive cough.  Using nasal saline.  Denies facial pain/pressure.  Has watery eyes.  No ear pain.  Denies SOB or wheezing.  Denies fever.  Denies body aches.  + sick contacts.     Review of Systems For ROS see HPI     Objective:   Physical Exam        Assessment & Plan:    "

## 2024-07-26 ENCOUNTER — Encounter: Payer: Self-pay | Admitting: Family Medicine

## 2024-07-26 NOTE — Telephone Encounter (Signed)
 Patient is asking about watering red eyes, also requesting a zpak for Jerry Simpson due to her also not feeling well and she isnt well enough to leave the house, I can offer a virtual visit

## 2024-08-22 ENCOUNTER — Ambulatory Visit: Admitting: Family Medicine
